# Patient Record
Sex: Female | Born: 1950 | Hispanic: Yes | Smoking: Former smoker
Health system: Southern US, Community
[De-identification: ages and names within clinical notes are randomized; demographics above are authoritative.]

## PROBLEM LIST (undated history)

## (undated) DIAGNOSIS — Z800 Family history of malignant neoplasm of digestive organs: Secondary | ICD-10-CM

## (undated) DIAGNOSIS — F32A Depression, unspecified: Secondary | ICD-10-CM

## (undated) DIAGNOSIS — B019 Varicella without complication: Secondary | ICD-10-CM

## (undated) DIAGNOSIS — J45909 Unspecified asthma, uncomplicated: Secondary | ICD-10-CM

## (undated) DIAGNOSIS — I10 Essential (primary) hypertension: Secondary | ICD-10-CM

## (undated) DIAGNOSIS — Z8041 Family history of malignant neoplasm of ovary: Secondary | ICD-10-CM

## (undated) DIAGNOSIS — R569 Unspecified convulsions: Secondary | ICD-10-CM

## (undated) DIAGNOSIS — F329 Major depressive disorder, single episode, unspecified: Secondary | ICD-10-CM

## (undated) DIAGNOSIS — D649 Anemia, unspecified: Secondary | ICD-10-CM

## (undated) DIAGNOSIS — R51 Headache: Secondary | ICD-10-CM

## (undated) DIAGNOSIS — Z8049 Family history of malignant neoplasm of other genital organs: Secondary | ICD-10-CM

## (undated) HISTORY — DX: Family history of malignant neoplasm of ovary: Z80.41

## (undated) HISTORY — DX: Major depressive disorder, single episode, unspecified: F32.9

## (undated) HISTORY — PX: CATARACT EXTRACTION: SUR2

## (undated) HISTORY — DX: Unspecified convulsions: R56.9

## (undated) HISTORY — DX: Depression, unspecified: F32.A

## (undated) HISTORY — DX: Anemia, unspecified: D64.9

## (undated) HISTORY — DX: Family history of malignant neoplasm of digestive organs: Z80.0

## (undated) HISTORY — DX: Unspecified asthma, uncomplicated: J45.909

## (undated) HISTORY — DX: Headache: R51

## (undated) HISTORY — DX: Family history of malignant neoplasm of other genital organs: Z80.49

## (undated) HISTORY — DX: Essential (primary) hypertension: I10

## (undated) HISTORY — DX: Varicella without complication: B01.9

## (undated) HISTORY — PX: COLONOSCOPY: SHX174

---

## 1987-11-29 HISTORY — PX: ABDOMINAL HYSTERECTOMY: SHX81

## 2007-05-01 ENCOUNTER — Emergency Department (HOSPITAL_COMMUNITY): Admission: EM | Admit: 2007-05-01 | Discharge: 2007-05-01 | Payer: Self-pay | Admitting: Emergency Medicine

## 2009-08-18 ENCOUNTER — Encounter: Admission: RE | Admit: 2009-08-18 | Discharge: 2009-08-18 | Payer: Self-pay | Admitting: Family Medicine

## 2010-07-16 ENCOUNTER — Emergency Department (HOSPITAL_BASED_OUTPATIENT_CLINIC_OR_DEPARTMENT_OTHER)
Admission: EM | Admit: 2010-07-16 | Discharge: 2010-07-16 | Payer: Self-pay | Source: Home / Self Care | Admitting: Emergency Medicine

## 2010-07-16 ENCOUNTER — Observation Stay (HOSPITAL_COMMUNITY): Admission: AD | Admit: 2010-07-16 | Discharge: 2010-07-17 | Payer: Self-pay | Admitting: Internal Medicine

## 2010-07-16 ENCOUNTER — Ambulatory Visit: Payer: Self-pay | Admitting: Diagnostic Radiology

## 2011-02-11 LAB — BASIC METABOLIC PANEL
BUN: 5 mg/dL — ABNORMAL LOW (ref 6–23)
CO2: 30 mEq/L (ref 19–32)
Calcium: 8.5 mg/dL (ref 8.4–10.5)
Chloride: 103 mEq/L (ref 96–112)
Creatinine, Ser: 0.61 mg/dL (ref 0.4–1.2)
GFR calc Af Amer: >60 mL/min (ref >60)
GFR calc non Af Amer: >60 mL/min (ref >60)
Glucose, Bld: 92 mg/dL (ref 70–99)
Potassium: 4.2 mEq/L (ref 3.5–5.1)
Sodium: 138 mEq/L (ref 135–145)

## 2011-02-11 LAB — COMPREHENSIVE METABOLIC PANEL
ALT: 33 U/L (ref 0–35)
AST: 23 U/L (ref 0–37)
Albumin: 3.6 g/dL (ref 3.5–5.2)
Alkaline Phosphatase: 79 U/L (ref 39–117)
BUN: 9 mg/dL (ref 6–23)
CO2: 28 mEq/L (ref 19–32)
Calcium: 8.3 mg/dL — ABNORMAL LOW (ref 8.4–10.5)
Chloride: 102 mEq/L (ref 96–112)
Creatinine, Ser: 0.6 mg/dL (ref 0.4–1.2)
GFR calc Af Amer: >60 mL/min (ref >60)
GFR calc non Af Amer: >60 mL/min (ref >60)
Glucose, Bld: 180 mg/dL — ABNORMAL HIGH (ref 70–99)
Potassium: 4.3 mEq/L (ref 3.5–5.1)
Sodium: 137 mEq/L (ref 135–145)
Total Bilirubin: 0.6 mg/dL (ref 0.3–1.2)
Total Protein: 6.7 g/dL (ref 6.0–8.3)

## 2011-02-11 LAB — CSF CELL COUNT WITH DIFFERENTIAL
RBC Count, CSF: 0 /mm3
Tube #: 4
WBC, CSF: 0 /mm3 (ref 0–5)

## 2011-02-11 LAB — POCT TOXICOLOGY PANEL

## 2011-02-11 LAB — CSF CULTURE W GRAM STAIN: Culture: NO GROWTH

## 2011-02-11 LAB — GLUCOSE, CAPILLARY: Glucose-Capillary: 174 mg/dL — ABNORMAL HIGH (ref 70–99)

## 2011-02-11 LAB — GLUCOSE, CSF: Glucose, CSF: 89 mg/dL — ABNORMAL HIGH (ref 43–76)

## 2011-02-11 LAB — URINALYSIS, ROUTINE W REFLEX MICROSCOPIC
Bilirubin Urine: NEGATIVE
Hgb urine dipstick: NEGATIVE
Protein, ur: NEGATIVE mg/dL
pH: 6.5 (ref 5.0–8.0)

## 2011-02-11 LAB — CBC
HCT: 37.1 % (ref 36.0–46.0)
HCT: 41.5 % (ref 36.0–46.0)
Hemoglobin: 12.8 g/dL (ref 12.0–15.0)
Hemoglobin: 14.3 g/dL (ref 12.0–15.0)
MCH: 31.6 pg (ref 26.0–34.0)
MCH: 31.6 pg (ref 26.0–34.0)
MCHC: 34.4 g/dL (ref 30.0–36.0)
MCHC: 34.4 g/dL (ref 30.0–36.0)
MCV: 91.8 fL (ref 78.0–100.0)
MCV: 92 fL (ref 78.0–100.0)
Platelets: 202 10*3/uL (ref 150–400)
Platelets: 224 10*3/uL (ref 150–400)
RBC: 4.03 MIL/uL (ref 3.87–5.11)
RBC: 4.51 MIL/uL (ref 3.87–5.11)
RDW: 11.8 % (ref 11.5–15.5)
RDW: 12.4 % (ref 11.5–15.5)
WBC: 6.8 10*3/uL (ref 4.0–10.5)
WBC: 8.5 10*3/uL (ref 4.0–10.5)

## 2011-02-11 LAB — DIFFERENTIAL
Monocytes Relative: 4 % (ref 3–12)
Neutrophils Relative %: 85 % — ABNORMAL HIGH (ref 43–77)

## 2011-02-11 LAB — GRAM STAIN

## 2011-02-11 LAB — TSH: TSH: 0.329 u[IU]/mL — ABNORMAL LOW (ref 0.350–4.500)

## 2011-02-11 LAB — HEMOGLOBIN A1C
Hgb A1c MFr Bld: 6.3 % — ABNORMAL HIGH (ref <5.7)
Mean Plasma Glucose: 134 mg/dL — ABNORMAL HIGH (ref <117)

## 2011-02-11 LAB — ETHANOL: Alcohol, Ethyl (B): <10 mg/dL (ref 0–10)

## 2011-02-11 LAB — PROTEIN, CSF: Total  Protein, CSF: 20 mg/dL (ref 15–45)

## 2011-09-01 ENCOUNTER — Other Ambulatory Visit: Payer: Self-pay | Admitting: Obstetrics

## 2012-07-17 ENCOUNTER — Ambulatory Visit (INDEPENDENT_AMBULATORY_CARE_PROVIDER_SITE_OTHER): Payer: PRIVATE HEALTH INSURANCE | Admitting: Family Medicine

## 2012-07-17 ENCOUNTER — Encounter: Payer: Self-pay | Admitting: Family Medicine

## 2012-07-17 VITALS — BP 120/78 | HR 64 | Temp 98.4°F | Ht 58.5 in | Wt 134.0 lb

## 2012-07-17 DIAGNOSIS — E871 Hypo-osmolality and hyponatremia: Secondary | ICD-10-CM

## 2012-07-17 DIAGNOSIS — I10 Essential (primary) hypertension: Secondary | ICD-10-CM | POA: Insufficient documentation

## 2012-07-17 DIAGNOSIS — G40309 Generalized idiopathic epilepsy and epileptic syndromes, not intractable, without status epilepticus: Secondary | ICD-10-CM

## 2012-07-17 DIAGNOSIS — Z800 Family history of malignant neoplasm of digestive organs: Secondary | ICD-10-CM | POA: Insufficient documentation

## 2012-07-17 DIAGNOSIS — G40409 Other generalized epilepsy and epileptic syndromes, not intractable, without status epilepticus: Secondary | ICD-10-CM | POA: Insufficient documentation

## 2012-07-17 DIAGNOSIS — Z1211 Encounter for screening for malignant neoplasm of colon: Secondary | ICD-10-CM

## 2012-07-17 DIAGNOSIS — Z8709 Personal history of other diseases of the respiratory system: Secondary | ICD-10-CM | POA: Insufficient documentation

## 2012-07-17 LAB — BASIC METABOLIC PANEL
CO2: 32 mEq/L (ref 19–32)
Sodium: 132 mEq/L — ABNORMAL LOW (ref 135–145)

## 2012-07-17 NOTE — Patient Instructions (Addendum)
Schedule complete physical Set up screening mammogram We will call you regarding colonoscopy.

## 2012-07-17 NOTE — Progress Notes (Signed)
  Subjective:    Patient ID: Glenda Francis, female    DOB: [birth year], 61 y.o.   MRN: [id number]  HPI  Here to establish care. Patient has history of reported asthma in childhood. Past history of depression not currently treated. Hypertension treated with Diovan. History of seizure disorder. Diagnosed over 2 years ago. Grand mal seizures. Patient does not drive. No seizure almost one year. Follow closely by neurology. Reported hyponatremia by recent labs. Requesting repeat today. She's not sure of exact lab value. Does not take any diuretics. Hypertension treated with Diovan 160 mg daily.  Patient is nonsmoker. Previous hysterectomy which is partial hysterectomy secondary to menorrhagia. Divorced. 3 children. 2 grandchildren. Drinks 1-2 glasses of red wine per day.  Family history significant for mother ovarian cancer. She had 2 brothers with colon cancer one deceased. She's never had screening colonoscopy. Mother had hyperlipidemia and hypertension.  Patient recently started exercising regularly and feels well overall. No appetite or weight changes.   Review of Systems  Constitutional: Negative for appetite change, fatigue and unexpected weight change.  Eyes: Negative for visual disturbance.  Respiratory: Negative for cough, chest tightness, shortness of breath and wheezing.   Cardiovascular: Negative for chest pain, palpitations and leg swelling.  Gastrointestinal: Negative for abdominal pain.  Genitourinary: Negative for dysuria.  Neurological: Negative for dizziness, seizures, syncope, weakness, light-headedness and headaches.       Objective:   Physical Exam  Constitutional: She is oriented to person, place, and time. She appears well-developed and well-nourished.  HENT:  Right Ear: External ear normal.  Left Ear: External ear normal.  Mouth/Throat: Oropharynx is clear and moist.  Neck: Neck supple. No thyromegaly present.  Cardiovascular: Normal rate and regular rhythm.     Pulmonary/Chest: Effort normal and breath sounds normal. No respiratory distress. She has no wheezes. She has no rales.  Musculoskeletal: She exhibits no edema.  Lymphadenopathy:    She has no cervical adenopathy.  Neurological: She is alert and oriented to person, place, and time.          Assessment & Plan:  #1 seizure disorder. Followed by neurology. #2 history of hyponatremia by history. Recheck basic metabolic panel #3 reported history of mild intermittent asthma. No current issues. No wheezing in several years  #4 history of depression #5 strong family history of colon cancer 2 brothers. Patient never screened. Set up screening colonoscopy.  #6 other health maintenance.  No recent mammogram. She'll schedule this. Schedule complete physical.

## 2012-07-18 NOTE — Progress Notes (Signed)
Quick Note:  Left a message on pt's voicemail to return call. ______

## 2012-07-19 ENCOUNTER — Other Ambulatory Visit: Payer: Self-pay | Admitting: *Deleted

## 2012-07-19 MED ORDER — VALSARTAN-HYDROCHLOROTHIAZIDE 160-12.5 MG PO TABS: 1.0000 | ORAL_TABLET | Freq: Every day | ORAL | Status: DC

## 2012-07-23 ENCOUNTER — Other Ambulatory Visit: Payer: Self-pay | Admitting: Family Medicine

## 2012-07-23 MED ORDER — VALSARTAN 160 MG PO TABS: 160.0000 mg | ORAL_TABLET | Freq: Every day | ORAL | Status: DC

## 2012-07-24 ENCOUNTER — Other Ambulatory Visit: Payer: Self-pay | Admitting: Family Medicine

## 2012-07-24 DIAGNOSIS — Z1231 Encounter for screening mammogram for malignant neoplasm of breast: Secondary | ICD-10-CM

## 2012-08-07 ENCOUNTER — Ambulatory Visit
Admission: RE | Admit: 2012-08-07 | Discharge: 2012-08-07 | Disposition: A | Discharge status: Home / Self Care | Payer: PRIVATE HEALTH INSURANCE | Source: Ambulatory Visit | Attending: Family Medicine | Admitting: Family Medicine

## 2012-08-07 DIAGNOSIS — Z1231 Encounter for screening mammogram for malignant neoplasm of breast: Secondary | ICD-10-CM

## 2012-08-21 ENCOUNTER — Encounter: Payer: Self-pay | Admitting: Family Medicine

## 2012-08-21 ENCOUNTER — Ambulatory Visit (INDEPENDENT_AMBULATORY_CARE_PROVIDER_SITE_OTHER): Payer: PRIVATE HEALTH INSURANCE | Admitting: Family Medicine

## 2012-08-21 VITALS — BP 122/70 | Temp 99.0°F | Wt 130.0 lb

## 2012-08-21 DIAGNOSIS — E871 Hypo-osmolality and hyponatremia: Secondary | ICD-10-CM

## 2012-08-21 DIAGNOSIS — G4700 Insomnia, unspecified: Secondary | ICD-10-CM

## 2012-08-21 DIAGNOSIS — I10 Essential (primary) hypertension: Secondary | ICD-10-CM

## 2012-08-21 DIAGNOSIS — Z79899 Other long term (current) drug therapy: Secondary | ICD-10-CM

## 2012-08-21 LAB — CBC WITH DIFFERENTIAL/PLATELET
Basophils Relative: 0.9 % (ref 0.0–3.0)
Eosinophils Absolute: 0.2 10*3/uL (ref 0.0–0.7)
Eosinophils Relative: 3.4 % (ref 0.0–5.0)
Hemoglobin: 14.1 g/dL (ref 12.0–15.0)
Lymphocytes Relative: 43.6 % (ref 12.0–46.0)
MCHC: 33.2 g/dL (ref 30.0–36.0)
MCV: 93.3 fl (ref 78.0–100.0)
Neutro Abs: 1.7 10*3/uL (ref 1.4–7.7)
Neutrophils Relative %: 38.8 % — ABNORMAL LOW (ref 43.0–77.0)
RBC: 4.55 Mil/uL (ref 3.87–5.11)
WBC: 4.4 10*3/uL — ABNORMAL LOW (ref 4.5–10.5)

## 2012-08-21 LAB — BASIC METABOLIC PANEL
BUN: 16 mg/dL (ref 6–23)
Calcium: 9.3 mg/dL (ref 8.4–10.5)
Creatinine, Ser: 0.7 mg/dL (ref 0.4–1.2)

## 2012-08-21 LAB — HEPATIC FUNCTION PANEL
ALT: 23 U/L (ref 0–35)
Bilirubin, Direct: 0.1 mg/dL (ref 0.0–0.3)
Total Bilirubin: 0.6 mg/dL (ref 0.3–1.2)

## 2012-08-21 NOTE — Progress Notes (Signed)
  Subjective:    Patient ID: Glenda Francis, female    DOB: 1951/05/11, 61 y.o.   MRN: [id number]  HPI  Followup hypertension and hyponatremia. Recent sodium 132. We discontinued Diovan HCTZ and switched to plain Diovan. Her blood pressures been stable. No headaches. No dizziness. Plan is to repeat basic metabolic panel today.  Recent problem with insomnia. No difficulty falling asleep but has early morning awakening. No depression issues. Drinks about 2 glasses of wine per night. Does not take any sedative hypnotics. No major stress issues. Frequently stays awake for several hours before falling back to sleep.  History of grand mal seizures. No seizures almost one year. Hopes to be released to driving within the next month.  Past Medical History  Diagnosis Date  . Asthma     childhood  . Chicken pox   . Depression   . Hypertension   . Seizures    Past Surgical History  Procedure Date  . Abdominal hysterectomy 1989    partial, menorrhagia    reports that she has never smoked. She does not have any smokeless tobacco history on file. Her alcohol and drug histories not on file. family history includes Asthma in her other; Cancer in her brothers and other; and Hyperlipidemia in her other. No Known Allergies    Review of Systems  Constitutional: Negative for fatigue and unexpected weight change.  Eyes: Negative for visual disturbance.  Respiratory: Negative for cough, chest tightness, shortness of breath and wheezing.   Cardiovascular: Negative for chest pain, palpitations and leg swelling.  Neurological: Negative for dizziness, seizures, syncope, weakness, light-headedness and headaches.       Objective:   Physical Exam  Constitutional: She is oriented to person, place, and time. She appears well-developed and well-nourished.  Neck: Neck supple. No thyromegaly present.  Cardiovascular: Normal rate and regular rhythm.   Pulmonary/Chest: Effort normal and breath sounds normal. No  respiratory distress. She has no wheezes. She has no rales.  Musculoskeletal: She exhibits no edema.  Lymphadenopathy:    She has no cervical adenopathy.  Neurological: She is alert and oriented to person, place, and time. No cranial nerve deficit.          Assessment & Plan:  #1 hyponatremia. Hopefully improved off of HCTZ. Recheck basic metabolic panel #2 hypertension well controlled with recent change from Diovan HCTZ to regular Diovan.  #3 insomnia. Handout on sleep hygiene given. Reduce alcohol consumption at night. We have recommended against sedative hypnotics at this time. #4 flu vaccine offered and patient declines. She plans to set up colonoscopy

## 2012-08-21 NOTE — Patient Instructions (Signed)

## 2012-08-22 NOTE — Progress Notes (Signed)
Quick Note:  Pt informed ______ 

## 2012-08-22 NOTE — Progress Notes (Signed)
Quick Note:  Will fax report to Darrol Angel, MD at [phone] ______

## 2012-12-25 ENCOUNTER — Telehealth: Payer: Self-pay | Admitting: Family Medicine

## 2012-12-25 MED ORDER — VALSARTAN 160 MG PO TABS: 160.0000 mg | ORAL_TABLET | Freq: Every day | ORAL | Status: DC

## 2012-12-25 NOTE — Telephone Encounter (Signed)
Patient called stating that she need a refill of her diovan 160mg  1poqd sent to [facility] [address]. Please assist.

## 2012-12-31 ENCOUNTER — Telehealth: Payer: Self-pay | Admitting: Family Medicine

## 2012-12-31 NOTE — Telephone Encounter (Signed)
Per pt's new 2014 plan for [employer] [place], prior auth for Diovan is denied. Per insurance, patient must try one of the following: candasartan/candasartan HCTZ, irbesartan/irbesartan HCTZ, losartan/losartan HCTZ, valsartan/valsartan HCTZ.   Pharmacy: [facility] [address]

## 2013-01-01 ENCOUNTER — Telehealth: Payer: Self-pay | Admitting: Family Medicine

## 2013-01-01 MED ORDER — LOSARTAN POTASSIUM 100 MG PO TABS: 100.0000 mg | ORAL_TABLET | Freq: Every day | ORAL | Status: DC

## 2013-01-01 NOTE — Telephone Encounter (Signed)
Patient's son called. He was upset about her Diovan not being approved by [employer]. I checked for samples, we do not have any. I consulted w/ Dr. Tawanna Cooler, as she will be out of her Diovan after today. He rx'd Cozaar 100mg  and said for pt f/u w/ PCP. I asked to speak w/ patietn as there is no DPR. I explained what Dr. Tawanna Cooler said, including having her check her BP daily. If anything is out of the norm, she will call our office. Otherwise, she has appt w/Dr. Caryl Never on 2/12. The son was not agreeable with this plan. I reiterated to the son that she cannot be without medication, but that also, [employer] was not going to pay for Diovan, so there was no choice but to switch to another preferred med in that class. He did not seem to understand, kept asking why she could not be on valsartan. I tried to explain that that was Diovan, which they would not pay for. Glenda Francis was very happy with the plan, understood what to do, and said she would call if needed. FYI.

## 2013-01-01 NOTE — Telephone Encounter (Signed)
Cozaar per Dr Tawanna Cooler, Rx sent to pharmacy.

## 2013-01-09 ENCOUNTER — Ambulatory Visit (INDEPENDENT_AMBULATORY_CARE_PROVIDER_SITE_OTHER): Payer: BC Managed Care – PPO | Admitting: Family Medicine

## 2013-01-09 ENCOUNTER — Encounter: Payer: Self-pay | Admitting: Family Medicine

## 2013-01-09 VITALS — BP 122/70 | Temp 97.7°F | Wt 131.0 lb

## 2013-01-09 DIAGNOSIS — I10 Essential (primary) hypertension: Secondary | ICD-10-CM

## 2013-01-09 NOTE — Patient Instructions (Addendum)

## 2013-01-09 NOTE — Progress Notes (Signed)
  Subjective:    Patient ID: Glenda Francis, female    DOB: [birth year], 62 y.o.   MRN: [id number]  HPI  Followup hypertension. Patient recently switched to losartan because of insurance issues. She's tolerating with no side effects. Blood pressures generally well-controlled at home in normal range. No headaches. No dizziness. Generally feels well. She usually walks about 1 mile per day for exercise.  Remains on Keppra and Depakote per neurology and no seizures in over one year.  Past Medical History  Diagnosis Date  . Asthma     childhood  . Chicken pox   . Depression   . Hypertension   . Seizures    Past Surgical History  Procedure Laterality Date  . Abdominal hysterectomy  1989    partial, menorrhagia    reports that she has never smoked. She does not have any smokeless tobacco history on file. Her alcohol and drug histories are not on file. family history includes Asthma in her other; Cancer in her brothers and other; and Hyperlipidemia in her other. No Known Allergies    Review of Systems  Constitutional: Negative for fatigue.  Eyes: Negative for visual disturbance.  Respiratory: Negative for cough, chest tightness, shortness of breath and wheezing.   Cardiovascular: Negative for chest pain, palpitations and leg swelling.  Neurological: Negative for dizziness, seizures, syncope, weakness, light-headedness and headaches.       Objective:   Physical Exam  Constitutional: She appears well-developed and well-nourished.  Neck: Neck supple. No thyromegaly present.  Cardiovascular: Normal rate and regular rhythm.   Pulmonary/Chest: Effort normal and breath sounds normal. No respiratory distress. She has no wheezes. She has no rales.  Musculoskeletal: She exhibits no edema.          Assessment & Plan:  Hypertension. Stable by home readings and here today after change of medication to losartan continue close monitoring. Reassess 6 months

## 2013-04-09 ENCOUNTER — Other Ambulatory Visit: Payer: Self-pay | Admitting: Neurology

## 2013-04-12 ENCOUNTER — Encounter: Payer: Self-pay | Admitting: Internal Medicine

## 2013-04-26 ENCOUNTER — Other Ambulatory Visit: Payer: Self-pay | Admitting: Family Medicine

## 2013-04-26 ENCOUNTER — Other Ambulatory Visit: Payer: Self-pay | Admitting: Neurology

## 2013-05-02 ENCOUNTER — Encounter: Payer: Self-pay | Admitting: Internal Medicine

## 2013-05-02 ENCOUNTER — Ambulatory Visit (AMBULATORY_SURGERY_CENTER): Payer: BC Managed Care – PPO | Admitting: *Deleted

## 2013-05-02 VITALS — Ht 60.0 in | Wt 132.8 lb

## 2013-05-02 DIAGNOSIS — Z800 Family history of malignant neoplasm of digestive organs: Secondary | ICD-10-CM

## 2013-05-02 DIAGNOSIS — Z1211 Encounter for screening for malignant neoplasm of colon: Secondary | ICD-10-CM

## 2013-05-02 MED ORDER — MOVIPREP 100 G PO SOLR: 1.0000 | Freq: Once | ORAL | Status: DC

## 2013-05-02 NOTE — Progress Notes (Signed)
No egg or soy allergy. ewm Pt does have seizure disorder. On keppra and depakote. And controlled. ewm No home oxygen use. ewm No prior colonoscopy per pt. ewm No problems with past sedation. ewm

## 2013-05-16 ENCOUNTER — Encounter: Payer: Self-pay | Admitting: Internal Medicine

## 2013-05-16 ENCOUNTER — Ambulatory Visit (AMBULATORY_SURGERY_CENTER): Payer: BC Managed Care – PPO | Admitting: Internal Medicine

## 2013-05-16 VITALS — BP 130/59 | HR 57 | Temp 97.2°F | Resp 18 | Ht 60.0 in | Wt 130.0 lb

## 2013-05-16 DIAGNOSIS — Z800 Family history of malignant neoplasm of digestive organs: Secondary | ICD-10-CM

## 2013-05-16 DIAGNOSIS — Z1211 Encounter for screening for malignant neoplasm of colon: Secondary | ICD-10-CM

## 2013-05-16 MED ORDER — SODIUM CHLORIDE 0.9 % IV SOLN: 500.0000 mL | INTRAVENOUS | Status: DC

## 2013-05-16 NOTE — Progress Notes (Signed)
Patient did not experience any of the following events: a burn prior to discharge; a fall within the facility; wrong site/side/patient/procedure/implant event; or a hospital transfer or hospital admission upon discharge from the facility. (G8907) Patient did not have preoperative order for IV antibiotic SSI prophylaxis. (G8918)  

## 2013-05-16 NOTE — Op Note (Signed)
[facility] [address] [place], [address]   COLONOSCOPY PROCEDURE REPORT  PATIENT: Glenda, Francis  MR#: [id number] BIRTHDATE: Dec 31, 1950 , 61  yrs. old GENDER: Female ENDOSCOPIST: Beverley Fiedler, MD REFERRED AO:ZHYQM Caryl Never, M.D. PROCEDURE DATE:  05/16/2013 PROCEDURE:   Colonoscopy, screening ASA CLASS:   Class II INDICATIONS:elevated risk screening and Patient's immediate family history of colon cancer (2 brothers). MEDICATIONS: MAC sedation, administered by CRNA and propofol (Diprivan) 150mg  IV  DESCRIPTION OF PROCEDURE:   After the risks benefits and alternatives of the procedure were thoroughly explained, informed consent was obtained.  A digital rectal exam revealed no rectal mass.   The LB [id number]  endoscope was introduced through the anus and advanced to the cecum, which was identified by both the appendix and ileocecal valve. No adverse events experienced. The quality of the prep was good, using MoviPrep  The instrument was then slowly withdrawn as the colon was fully examined.      COLON FINDINGS: Moderate diverticulosis was noted in the ascending colon, transverse colon, descending colon, and sigmoid colon.   The colon mucosa was otherwise normal.  Retroflexed views revealed no abnormalities. The time to cecum=4 minutes 31 seconds.  Withdrawal time=8 minutes 42 seconds.  The scope was withdrawn and the procedure completed.  COMPLICATIONS: There were no complications.  ENDOSCOPIC IMPRESSION: 1.   Moderate diverticulosis was noted in the ascending colon, transverse colon, descending colon, and sigmoid colon 2.   The colon mucosa was otherwise normal  RECOMMENDATIONS: 1.  High fiber diet 2.  Given your significant family history of colon cancer, you should have a repeat colonoscopy in 5 years   eSigned:  Beverley Fiedler, MD 05/16/2013 3:05 PM   cc: The Patient and Evelena Peat, MD   PATIENT NAME:  Glenda, Francis MR#:  [id number]

## 2013-05-16 NOTE — Progress Notes (Signed)
Lidocaine-40mg IV prior to Propofol InductionPropofol given over incremental dosages 

## 2013-05-16 NOTE — Patient Instructions (Addendum)
Discharge instructions given with verbal understanding. Handouts on diverticulosis and a high fiber diet given. Resume previous medications.YOU HAD AN ENDOSCOPIC PROCEDURE TODAY AT THE [facility]: Refer to the procedure report that was given to you for any specific questions about what was found during the examination.  If the procedure report does not answer your questions, please call your gastroenterologist to clarify.  If you requested that your care partner not be given the details of your procedure findings, then the procedure report has been included in a sealed envelope for you to review at your convenience later.  YOU SHOULD EXPECT: Some feelings of bloating in the abdomen. Passage of more gas than usual.  Walking can help get rid of the air that was put into your GI tract during the procedure and reduce the bloating. If you had a lower endoscopy (such as a colonoscopy or flexible sigmoidoscopy) you may notice spotting of blood in your stool or on the toilet paper. If you underwent a bowel prep for your procedure, then you may not have a normal bowel movement for a few days.  DIET: Your first meal following the procedure should be a light meal and then it is ok to progress to your normal diet.  A half-sandwich or bowl of soup is an example of a good first meal.  Heavy or fried foods are harder to digest and may make you feel nauseous or bloated.  Likewise meals heavy in dairy and vegetables can cause extra gas to form and this can also increase the bloating.  Drink plenty of fluids but you should avoid alcoholic beverages for 24 hours.  ACTIVITY: Your care partner should take you home directly after the procedure.  You should plan to take it easy, moving slowly for the rest of the day.  You can resume normal activity the day after the procedure however you should NOT DRIVE or use heavy machinery for 24 hours (because of the sedation medicines used during the test).    SYMPTOMS TO  REPORT IMMEDIATELY: A gastroenterologist can be reached at any hour.  During normal business hours, 8:30 AM to 5:00 PM Monday through Friday, call [phone].  After hours and on weekends, please call the GI answering service at [phone] who will take a message and have the physician on call contact you.   Following lower endoscopy (colonoscopy or flexible sigmoidoscopy):  Excessive amounts of blood in the stool  Significant tenderness or worsening of abdominal pains  Swelling of the abdomen that is new, acute  Fever of 100F or higher  FOLLOW UP: If any biopsies were taken you will be contacted by phone or by letter within the next 1-3 weeks.  Call your gastroenterologist if you have not heard about the biopsies in 3 weeks.  Our staff will call the home number listed on your records the next business day following your procedure to check on you and address any questions or concerns that you may have at that time regarding the information given to you following your procedure. This is a courtesy call and so if there is no answer at the home number and we have not heard from you through the emergency physician on call, we will assume that you have returned to your regular daily activities without incident.  SIGNATURES/CONFIDENTIALITY: You and/or your care partner have signed paperwork which will be entered into your electronic medical record.  These signatures attest to the fact that that the information above on your   After Visit Summary has been reviewed and is understood.  Full responsibility of the confidentiality of this discharge information lies with you and/or your care-partner.   

## 2013-05-17 ENCOUNTER — Telehealth: Payer: Self-pay | Admitting: *Deleted

## 2013-05-17 NOTE — Telephone Encounter (Signed)
No answer, left message to call if questions or concerns. 

## 2013-07-29 ENCOUNTER — Other Ambulatory Visit: Payer: Self-pay | Admitting: Neurology

## 2013-07-30 ENCOUNTER — Other Ambulatory Visit: Payer: Self-pay | Admitting: Neurology

## 2013-08-27 ENCOUNTER — Other Ambulatory Visit: Payer: Self-pay | Admitting: Neurology

## 2013-08-28 ENCOUNTER — Other Ambulatory Visit: Payer: Self-pay

## 2013-08-28 MED ORDER — LOSARTAN POTASSIUM 100 MG PO TABS: ORAL_TABLET | ORAL | Status: DC

## 2013-08-30 ENCOUNTER — Encounter: Payer: Self-pay | Admitting: Family Medicine

## 2013-08-30 ENCOUNTER — Ambulatory Visit (INDEPENDENT_AMBULATORY_CARE_PROVIDER_SITE_OTHER): Payer: BC Managed Care – PPO | Admitting: Family Medicine

## 2013-08-30 VITALS — BP 118/80 | HR 64 | Temp 97.8°F | Wt 134.0 lb

## 2013-08-30 DIAGNOSIS — Z23 Encounter for immunization: Secondary | ICD-10-CM

## 2013-08-30 DIAGNOSIS — M25561 Pain in right knee: Secondary | ICD-10-CM

## 2013-08-30 DIAGNOSIS — M25569 Pain in unspecified knee: Secondary | ICD-10-CM

## 2013-08-30 MED ORDER — MELOXICAM 15 MG PO TABS: 15.0000 mg | ORAL_TABLET | Freq: Every day | ORAL | Status: DC

## 2013-08-30 NOTE — Progress Notes (Signed)
  Subjective:    Patient ID: Glenda Francis, female    DOB: [birth year], 62 y.o.   MRN: [id number]  HPI Patient seen with right knee pain. No specific injury. Onset about one week ago. She has pain mostly medial aspect of knee. No locking or giving way. No redness. No warmth. No ecchymosis. No prior history of knee difficulties. She is taking occasional ibuprofen with some relief.  Patient requesting flu vaccine. Also we cannot confirm last tetanus  Past Medical History  Diagnosis Date  . Asthma     childhood  . Chicken pox   . Depression   . Hypertension   . Seizures     STARTED 21/2 YEARS AGO   Past Surgical History  Procedure Laterality Date  . Abdominal hysterectomy  1989    partial, menorrhagia    reports that she has never smoked. She has never used smokeless tobacco. She reports that she drinks about 4.2 ounces of alcohol per week. She reports that she does not use illicit drugs. family history includes Asthma in her other; Cancer in her brother, brother, and mother; Colon cancer in her brother and brother; Hyperlipidemia in her other. No Known Allergies    Review of Systems  Constitutional: Negative for fever and chills.       Objective:   Physical Exam  Constitutional: She appears well-developed and well-nourished.  Cardiovascular: Normal rate and regular rhythm.   Pulmonary/Chest: Effort normal and breath sounds normal. No respiratory distress. She has no wheezes. She has no rales.  Musculoskeletal:  Right knee reveals no warmth. No erythema. No ecchymosis. Possibly trace effusion. She has mild to moderate medial joint line tenderness. No other tenderness noted. Full range of motion. Ligament testing is normal          Assessment & Plan:  Right knee pain. Suspect medial meniscal injury. Meloxicam 15 mg once daily. Avoid excessive squatting or bending. Followup in 3-4 weeks if not resolving.

## 2013-08-30 NOTE — Patient Instructions (Addendum)
Knee, Cartilage (Meniscus) Injury It is suspected that you have a torn cartilage (meniscus) in your knee. The menisci are made of tough cartilage, and fit between the surfaces of the thigh and leg bones. The menisci are "C"shaped and have a wedged profile. The wedged profile helps the stability of the joint by keeping the rounded femur surface from sliding off the flat tibial surface. The menisci are fed (nourished) by small blood vessels; but there is also a large area at the inner edge of the meniscus that does not have a good blood supply (avascular). This presents a problem when there is an injury to the meniscus, because areas without good blood supply heal poorly. As a result when there is a torn cartilage in the knee, surgery is often required to fix it. This is usually done with a surgical procedure less invasive than open surgery (arthroscopy). Some times open surgery of the knee is required if there is other damage. PURPOSE OF THE MENISCUS The medial meniscus rests on the medial tibial plateau. The tibia is the large bone in your lower leg (the shin bone). The medial tibial plateau is the upper end of the bone making up the inner part of your knee. The lateral meniscus serves the same purpose and is located on the outside of the knee. The menisci help to distribute your body weight across the knee joint; they act as shock absorbers. Without the meniscus present, the weight of your body would be unevenly applied to the bones in your legs (the femur and tibia). The femur is the large bone in your thigh. This uneven weight distribution would cause increased wear and tear on the cartilage lining the joint surfaces, leading to early damage (arthritis) of these areas. The presence of the menisci cartilage is necessary for a healthy knee. PURPOSE OF THE KNEE CARTILAGE The knee joint is made up of three bones: the thigh bone (femur), the shin bone (tibia), and the knee cap (patella). The surfaces of these  bones at the knee joint are covered with cartilage called articular cartilage. This smooth slippery surface allows the bones to slide against each other without causing bone damage. The meniscus sits between these cartilaginous surfaces of the bones. It distributes the weight evenly in the joints and helps with the stability of the joint (keeps the joint steady). HOME CARE INSTRUCTIONS  Use crutches and external braces as instructed.  Once home an ice pack applied to your injured knee may help with discomfort and keep the swelling down. An ice pack can be used for the first couple of days or as instructed.  Only take over-the-counter or prescription medicines for pain, discomfort, or fever as directed by your caregiver.  Call if you do not have relief of pain with medications or if there is increasing in pain.  Call if your foot becomes cold or blue.  You may resume normal diet and activities as directed.  Make sure to keep your appointment with your follow-up caregiver. This injury may require further evaluation and treatment beyond the temporary treatment given today. Document Released: 02/04/2003 Document Revised: 02/06/2012 Document Reviewed: 05/29/2009 [facility] Patient Information 2014 [place], [place].  Follow up in 3-4 weeks if no better.

## 2013-09-17 ENCOUNTER — Ambulatory Visit (INDEPENDENT_AMBULATORY_CARE_PROVIDER_SITE_OTHER): Payer: BC Managed Care – PPO | Admitting: Nurse Practitioner

## 2013-09-17 ENCOUNTER — Encounter: Payer: Self-pay | Admitting: Nurse Practitioner

## 2013-09-17 VITALS — BP 138/73 | HR 59 | Ht <= 58 in | Wt 132.0 lb

## 2013-09-17 DIAGNOSIS — Z79899 Other long term (current) drug therapy: Secondary | ICD-10-CM

## 2013-09-17 DIAGNOSIS — G40409 Other generalized epilepsy and epileptic syndromes, not intractable, without status epilepticus: Secondary | ICD-10-CM

## 2013-09-17 DIAGNOSIS — G40309 Generalized idiopathic epilepsy and epileptic syndromes, not intractable, without status epilepticus: Secondary | ICD-10-CM

## 2013-09-17 LAB — CBC WITH DIFFERENTIAL/PLATELET
Basophils Absolute: 0 10*3/uL (ref 0.0–0.2)
Basos: 1 %
Eosinophils Absolute: 0.2 10*3/uL (ref 0.0–0.4)
Hemoglobin: 13.3 g/dL (ref 11.1–15.9)
Lymphocytes Absolute: 2.5 10*3/uL (ref 0.7–3.1)
MCH: 31.2 pg (ref 26.6–33.0)
MCHC: 34.6 g/dL (ref 31.5–35.7)
MCV: 90 fL (ref 79–97)
Monocytes Absolute: 0.6 10*3/uL (ref 0.1–0.9)
Neutrophils Absolute: 1.5 10*3/uL (ref 1.4–7.0)
RBC: 4.26 x10E6/uL (ref 3.77–5.28)

## 2013-09-17 LAB — COMPREHENSIVE METABOLIC PANEL
ALT: 10 IU/L (ref 0–32)
AST: 17 IU/L (ref 0–40)
Alkaline Phosphatase: 56 IU/L (ref 39–117)
BUN/Creatinine Ratio: 21 (ref 11–26)
CO2: 32 mmol/L — ABNORMAL HIGH (ref 18–29)
Calcium: 9.5 mg/dL (ref 8.6–10.2)
Chloride: 94 mmol/L — ABNORMAL LOW (ref 96–108)
Globulin, Total: 2.5 g/dL (ref 1.5–4.5)
Potassium: 3.8 mmol/L (ref 3.5–5.2)
Sodium: 132 mmol/L — ABNORMAL LOW (ref 134–144)

## 2013-09-17 MED ORDER — DIVALPROEX SODIUM 500 MG PO DR TAB: 500.0000 mg | DELAYED_RELEASE_TABLET | Freq: Two times a day (BID) | ORAL | Status: DC

## 2013-09-17 MED ORDER — LEVETIRACETAM 500 MG PO TABS: 1000.0000 mg | ORAL_TABLET | Freq: Two times a day (BID) | ORAL | Status: DC

## 2013-09-17 NOTE — Patient Instructions (Signed)
Continue  Keppra and Depakote Will renew for 3 months with refills Check labs today F/U yearly

## 2013-09-17 NOTE — Progress Notes (Signed)
I have read the note, and I agree with the clinical assessment and plan.  Glenda Francis,Glenda Francis   

## 2013-09-17 NOTE — Progress Notes (Signed)
[facility]  PATIENT: Glenda Francis DOB: 01-01-1951   REASON FOR VISIT: Followup for seizure disorder   HISTORY OF PRESENT ILLNESS: Glenda Francis is a 62 year old right-handed Hispanic female with a history of seizures. This patient had been having what was felt to be panic attacks prior to her generalized seizures that occurred at night. The panic attacks were associated with amnesia and staring episodes at times. In retrospect, the daughter believes that the panic disorder was a manifestation of her seizures. EEG study done shows evidence of left brain sharp transients. MRI of the brain was unremarkable. Patient has been placed on Keppra, and is taking 500 mg tablets,total dose 2000mg .  Patient does have some drowsiness on the medication. Patient continued to have events and Depakote was added 07/2011.  No further seizure activity since depakote added. She is pleased with responce to meds.  She is exercising, she feels good, and she denies side effects to her meds. She was back to driving . No new neurologic complaints    REVIEW OF SYSTEMS: Full 14 system review of systems performed and notable only for:  Constitutional: N/A  Cardiovascular: N/A  Ear/Nose/Throat: N/A  Skin: N/A  Eyes: N/A  Respiratory: N/A  Gastroitestinal: N/A  Hematology/Lymphatic: N/A  Endocrine: N/A Musculoskeletal:N/A  Allergy/Immunology: Allergies Neurological: N/A Psychiatric: N/A   ALLERGIES: No Known Allergies  HOME MEDICATIONS: Outpatient Prescriptions Prior to Visit  Medication Sig Dispense Refill  . Calcium Carbonate-Vitamin D (CALCIUM + D PO) Take 1 capsule by mouth daily.      . Cyanocobalamin (VITAMIN B-12 CR PO) Take 1 capsule by mouth daily.      . divalproex (DEPAKOTE) 500 MG DR tablet Take 1 tablet (500 mg total) by mouth 2 (two) times daily.  60 tablet  0  . levETIRAcetam (KEPPRA) 500 MG tablet TAKE 2 TABLETS BY MOUTH TWICE A DAY  120 tablet  0  . losartan (COZAAR) 100 MG  tablet TAKE 1 TABLET (100 MG TOTAL) BY MOUTH DAILY.  30 tablet  3  . meloxicam (MOBIC) 15 MG tablet Take 1 tablet (15 mg total) by mouth daily.  30 tablet  1  . Multiple Vitamins-Minerals (MULTIVITAMIN PO) Take 1 capsule by mouth daily.      . Simethicone (GAS-X PO) Take 1 capsule by mouth as needed.       No facility-administered medications prior to visit.    PAST MEDICAL HISTORY: Past Medical History  Diagnosis Date  . Asthma     childhood  . Chicken pox   . Depression   . Hypertension   . Seizures     STARTED 21/2 YEARS AGO    PAST SURGICAL HISTORY: Past Surgical History  Procedure Laterality Date  . Abdominal hysterectomy  1989    partial, menorrhagia    FAMILY HISTORY: Family History  Problem Relation Age of Onset  . Cancer Mother     uterine,ovarian  . Asthma Other   . Hyperlipidemia Other   . Cancer Brother     colon-PASSEDA [address] 54  . Colon cancer Brother   . Cancer Brother     colon,stomach,brain  . Colon cancer Brother     SOCIAL HISTORY: History   Social History  . Marital Status: Divorced    Spouse Name: N/A    Number of Children: 3  . Years of Education: college   Occupational History  . retired    Social History Main Topics  . Smoking status: Former Games developer  . Smokeless tobacco:  Never Used  . Alcohol Use: 4.2 oz/week    7 Glasses of wine per week     Comment: 1 GLASS WINE WITH DINNER  . Drug Use: No  . Sexual Activity: Not on file   Other Topics Concern  . Not on file   Social History Narrative   Patient is divorced   Patient  has 3 children.   Patient is retired   Patient has some college.   Patient is currently doing yoga and water aerobics.     PHYSICAL EXAM  Filed Vitals:   09/17/13 1114  BP: 138/73  Pulse: 59  Height: 4\' 10"  (1.473 m)  Weight: 132 lb (59.875 kg)   Body mass index is 27.6 kg/(m^2).  Generalized: Well developed, in no acute distress   Neurological examination   Mentation: Alert oriented to time,  place, history taking. Follows all commands speech and language fluent  Cranial nerve II-XII: Pupils were equal round reactive to light extraocular movements were full, visual field were full on confrontational test. Facial sensation and strength were normal. hearing was intact to finger rubbing bilaterally. Uvula tongue midline. head turning and shoulder shrug and were normal and symmetric.Tongue protrusion into cheek strength was normal. Motor: normal bulk and tone, full strength in the BUE, BLE, fine finger movements normal, no pronator drift. No focal weakness Coordination: finger-nose-finger, heel-to-shin bilaterally, no dysmetria Reflexes: Brachioradialis 2/2, biceps 2/2, triceps 2/2, patellar 2/2, Achilles 2/2, plantar responses were flexor bilaterally. Gait and Station: Rising up from seated position without assistance, normal stance, without trunk ataxia, moderate stride, good arm swing, smooth turning, able to perform tiptoe, and heel walking without difficulty. Tandem stable, Romberg negative  DIAGNOSTIC DATA (LABS, IMAGING, TESTING) -None to review   ASSESSMENT AND PLAN  62 y.o. year old female  has a past medical history of Asthma;  Depression; Hypertension; and Seizures and panic attacks  here to followup. No seizures since 2012  Continue  Keppra and Depakote Will renew for 3 months with refills Check labs today, CBC and CMP F/U yearly Glenda Francis, [facility], [facility], APRN  [facility] [address], [address] [place], [place] [address] [phone]

## 2013-09-18 NOTE — Progress Notes (Signed)
Quick Note:  Spoke to patient and relayed stable labs, per [place]. ______

## 2013-10-25 ENCOUNTER — Other Ambulatory Visit: Payer: Self-pay

## 2013-10-25 DIAGNOSIS — Z1231 Encounter for screening mammogram for malignant neoplasm of breast: Secondary | ICD-10-CM

## 2013-11-29 ENCOUNTER — Ambulatory Visit: Payer: BC Managed Care – PPO

## 2013-12-10 ENCOUNTER — Ambulatory Visit
Admission: RE | Admit: 2013-12-10 | Discharge: 2013-12-10 | Disposition: A | Discharge status: Home / Self Care | Payer: BC Managed Care – PPO | Source: Ambulatory Visit

## 2013-12-10 DIAGNOSIS — Z1231 Encounter for screening mammogram for malignant neoplasm of breast: Secondary | ICD-10-CM

## 2013-12-25 ENCOUNTER — Other Ambulatory Visit: Payer: Self-pay | Admitting: Family Medicine

## 2014-01-30 ENCOUNTER — Telehealth: Payer: Self-pay | Admitting: Family Medicine

## 2014-01-30 MED ORDER — MELOXICAM 15 MG PO TABS: 15.0000 mg | ORAL_TABLET | Freq: Every day | ORAL | Status: DC

## 2014-01-30 NOTE — Telephone Encounter (Signed)
Pt needs refill on generic mobic call into [facility] [place]

## 2014-01-30 NOTE — Telephone Encounter (Signed)
RX sent to pharmacy  

## 2014-02-03 ENCOUNTER — Ambulatory Visit (INDEPENDENT_AMBULATORY_CARE_PROVIDER_SITE_OTHER): Payer: BC Managed Care – PPO | Admitting: Family Medicine

## 2014-02-03 ENCOUNTER — Encounter: Payer: Self-pay | Admitting: Family Medicine

## 2014-02-03 VITALS — BP 130/78 | HR 68 | Wt 135.0 lb

## 2014-02-03 DIAGNOSIS — H9319 Tinnitus, unspecified ear: Secondary | ICD-10-CM

## 2014-02-03 DIAGNOSIS — H9312 Tinnitus, left ear: Secondary | ICD-10-CM

## 2014-02-03 NOTE — Progress Notes (Signed)
   Subjective:    Patient ID: Glenda Francis, female    DOB: 1951-01-20, 63 y.o.   MRN: [id number]  HPI Patient complains of possibly several month history of ringing in the ears left ear greater than right. Possibly some associated hearing loss during this time. Again, she feels symptoms are worse left ear compared with right. She occasionally has a sensation of" roaring" type tinnitus involving the left year. Denies any headache. Occasional mild vertigo-but no consistent triggering factors. No recent exposure to loud noises. No ataxia. No focal weakness. She is very concerned because of positive family history of brain tumor in a couple relatives. Apparently one of them presented with similar types of symptoms. No aspirin use. No history of any other specific risk factors for tinnitus.  Past Medical History  Diagnosis Date  . Asthma     childhood  . Chicken pox   . Depression   . Hypertension   . Seizures     STARTED 21/2 YEARS AGO   Past Surgical History  Procedure Laterality Date  . Abdominal hysterectomy  1989    partial, menorrhagia    reports that she has quit smoking. She has never used smokeless tobacco. She reports that she drinks about 4.2 ounces of alcohol per week. She reports that she does not use illicit drugs. family history includes Asthma in her other; Cancer in her brother, brother, and mother; Colon cancer in her brother and brother; Hyperlipidemia in her other. No Known Allergies    Review of Systems  Constitutional: Negative for fever and chills.  HENT: Positive for hearing loss. Negative for congestion, ear discharge and ear pain.   Eyes: Negative for visual disturbance.  Respiratory: Negative for shortness of breath.   Cardiovascular: Negative for chest pain.  Neurological: Negative for dizziness, tremors, seizures, weakness and headaches.  Psychiatric/Behavioral: Negative for confusion.       Objective:   Physical Exam  Constitutional: She is oriented  to person, place, and time. She appears well-developed and well-nourished.  HENT:  Right Ear: External ear normal.  Left Ear: External ear normal.  Eyes: Pupils are equal, round, and reactive to light.  Neck: Neck supple. No thyromegaly present.  Cardiovascular: Normal rate.   Pulmonary/Chest: Effort normal and breath sounds normal. No respiratory distress. She has no wheezes. She has no rales.  Neurological: She is alert and oriented to person, place, and time. No cranial nerve deficit.  Psychiatric: She has a normal mood and affect. Her behavior is normal.          Assessment & Plan:  Tinnitus with subjective left ear greater than right. She also has some subjective question of hearing loss left greater than right. Start with audiometry. Audiometry performed which shows mild impairment at all frequencies including 500, 2000, and 4000 Hz and symmetric in both ears. Given symptoms of asymmetric tinnitus we'll set up MRI to further assess

## 2014-02-03 NOTE — Progress Notes (Signed)
Pre visit review using our clinic review tool, if applicable. No additional management support is needed unless otherwise documented below in the visit note. 

## 2014-02-19 ENCOUNTER — Ambulatory Visit
Admission: RE | Admit: 2014-02-19 | Discharge: 2014-02-19 | Disposition: A | Discharge status: Home / Self Care | Payer: BC Managed Care – PPO | Source: Ambulatory Visit | Attending: Family Medicine | Admitting: Family Medicine

## 2014-02-19 DIAGNOSIS — H9312 Tinnitus, left ear: Secondary | ICD-10-CM

## 2014-04-28 ENCOUNTER — Ambulatory Visit: Payer: BC Managed Care – PPO | Admitting: Obstetrics & Gynecology

## 2014-04-30 ENCOUNTER — Ambulatory Visit: Payer: BC Managed Care – PPO | Admitting: Obstetrics & Gynecology

## 2014-06-26 ENCOUNTER — Other Ambulatory Visit: Payer: Self-pay

## 2014-06-26 MED ORDER — DIVALPROEX SODIUM 500 MG PO DR TAB: 500.0000 mg | DELAYED_RELEASE_TABLET | Freq: Two times a day (BID) | ORAL | Status: DC

## 2014-08-14 ENCOUNTER — Ambulatory Visit (INDEPENDENT_AMBULATORY_CARE_PROVIDER_SITE_OTHER): Payer: BC Managed Care – PPO | Admitting: Obstetrics & Gynecology

## 2014-08-14 ENCOUNTER — Encounter: Payer: Self-pay | Admitting: Obstetrics & Gynecology

## 2014-08-14 DIAGNOSIS — Z01419 Encounter for gynecological examination (general) (routine) without abnormal findings: Secondary | ICD-10-CM

## 2014-08-14 NOTE — Progress Notes (Signed)
Subjective:     Katie Faraone is a 63 y.o. female here for a routine exam.  Current complaints: none.    Personal health questionnaire:  Is patient Ashkenazi Jewish, have a family history of breast and/or ovarian cancer: no Is there a family history of uterine cancer diagnosed at age < 80, gastrointestinal cancer, urinary tract cancer, family member who is a Field seismologist syndrome-associated carrier: yes Is the patient overweight and hypertensive, family history of diabetes, personal history of gestational diabetes or PCOS: yes Is patient over 55, have PCOS,  family history of premature CHD under age 21, diabetes, smoke, have hypertension or peripheral artery disease:  yes At any time, has a partner hit, kicked or otherwise hurt or frightened you?: no Over the past 2 weeks, have you felt down, depressed or hopeless?: no Over the past 2 weeks, have you felt little interest or pleasure in doing things?:no   Gynecologic History No LMP recorded. Patient has had a hysterectomy. Last mammogram: 1/15. Results were: normal  Obstetric History OB History  Gravida Para Term Preterm AB SAB TAB Ectopic Multiple Living  3 2            # Outcome Date GA Lbr Len/2nd Weight Sex Delivery Anes PTL Lv  3 GRA           2 PAR           1 PAR               Past Medical History  Diagnosis Date  . Asthma     childhood  . Chicken pox   . Depression   . Hypertension   . Seizures     STARTED 21/2 YEARS AGO    Past Surgical History  Procedure Laterality Date  . Abdominal hysterectomy  1989    partial, menorrhagia    Current outpatient prescriptions:Calcium Carbonate-Vitamin D (CALCIUM + D PO), Take 1 capsule by mouth daily., Disp: , Rfl: ;  Cyanocobalamin (VITAMIN B-12 CR PO), Take 1 capsule by mouth daily., Disp: , Rfl: ;  divalproex (DEPAKOTE) 500 MG DR tablet, Take 1 tablet (500 mg total) by mouth 2 (two) times daily., Disp: 180 tablet, Rfl: 0 levETIRAcetam (KEPPRA) 500 MG tablet, Take 2 tablets (1,000 mg  total) by mouth every 12 (twelve) hours., Disp: 360 tablet, Rfl: 3;  losartan (COZAAR) 100 MG tablet, TAKE 1 TABLET EVERY DAY, Disp: 30 tablet, Rfl: 11;  meloxicam (MOBIC) 15 MG tablet, Take 1 tablet (15 mg total) by mouth daily., Disp: 30 tablet, Rfl: 5;  Multiple Vitamins-Minerals (MULTIVITAMIN PO), Take 1 capsule by mouth daily., Disp: , Rfl:  Simethicone (GAS-X PO), Take 1 capsule by mouth as needed., Disp: , Rfl:  No Known Allergies  History  Substance Use Topics  . Smoking status: Former Research scientist (life sciences)  . Smokeless tobacco: Never Used  . Alcohol Use: 4.2 oz/week    7 Glasses of wine per week     Comment: 1 GLASS WINE WITH DINNER    Family History  Problem Relation Age of Onset  . Cancer Mother     uterine,ovarian  . Asthma Other   . Hyperlipidemia Other   . Cancer Brother     colon-PASSEDA WAY 54  . Colon cancer Brother   . Cancer Brother     colon,stomach,brain  . Colon cancer Brother       Review of Systems  Constitutional: negative for fatigue and weight loss Respiratory: negative for cough and wheezing Cardiovascular: negative for chest pain,  fatigue and palpitations Gastrointestinal: negative for abdominal pain and change in bowel habits Musculoskeletal:negative for myalgias Neurological: negative for gait problems and tremors Behavioral/Psych: negative for abusive relationship, depression Endocrine: negative for temperature intolerance   Genitourinary:negative for abnormal menstrual periods, genital lesions, hot flashes, sexual problems and vaginal discharge Integument/breast: negative for breast lump, breast tenderness, nipple discharge and skin lesion(s)    Objective:       There were no vitals taken for this visit. General:   alert  Skin:   no rash or abnormalities  Lungs:   clear to auscultation bilaterally  Heart:   regular rate and rhythm, S1, S2 normal, no murmur, click, rub or gallop  Breasts:   normal without suspicious masses, skin or nipple changes or  axillary nodes  Abdomen:  normal findings: no organomegaly, soft, non-tender and no hernia  Pelvis:  External genitalia: normal general appearance Urinary system: urethral meatus normal and bladder without fullness, nontender Vaginal: normal without tenderness, induration or masses Adnexa: normal bimanual exam    Lab Review Urine pregnancy test Labs reviewed no Radiologic studies reviewed no    Assessment:    Healthy female exam.    Plan:    Education reviewed: calcium supplements, low fat, low cholesterol diet and weight bearing exercise.    Follow up as needed.

## 2014-08-14 NOTE — Patient Instructions (Signed)
Bone Health Our bones do many things. They provide structure, protect organs, anchor muscles, and store calcium. Adequate calcium in your diet and weight-bearing physical activity help build strong bones, improve bone amounts, and may reduce the risk of weakening of bones (osteoporosis) later in life. PEAK BONE MASS By age 62, the average woman has acquired most of her skeletal bone mass. A large decline occurs in older adults which increases the risk of osteoporosis. In women this occurs around the time of menopause. It is important for young girls to reach their peak bone mass in order to maintain bone health throughout life. A person with high bone mass as a young adult will be more likely to have a higher bone mass later in life. Not enough calcium consumption and physical activity early on could result in a failure to achieve optimum bone mass in adulthood. OSTEOPOROSIS Osteoporosis is a disease of the bones. It is defined as low bone mass with deterioration of bone structure. Osteoporosis leads to an increase risk of fractures with falls. These fractures commonly happen in the wrist, hip, and spine. While men and women of all ages and background can develop osteoporosis, some of the risk factors for osteoporosis are:  Female.  White.  Postmenopausal.  Older adults.  Small in body size.  Eating a diet low in calcium.  Physically inactive.  Smoking.  Use of some medications.  Family history. CALCIUM Calcium is a mineral needed by the body for healthy bones, teeth, and proper function of the heart, muscles, and nerves. The body cannot produce calcium so it must be absorbed through food. Good sources of calcium include:  Dairy products (low fat or nonfat milk, cheese, and yogurt).  Dark green leafy vegetables (bok choy and broccoli).  Calcium fortified foods (orange juice, cereal, bread, soy beverages, and tofu products).  Nuts (almonds). Recommended amounts of calcium vary  for individuals. RECOMMENDED CALCIUM INTAKES Age and Amount in mg per day  Children 1 to 3 years / 700 mg  Children 4 to 8 years / 1,000 mg  Children 9 to 13 years / 1,300 mg  Teens 14 to 18 years / 1,300 mg  Adults 19 to 50 years / 1,000 mg  Adult women 51 to 70 years / 1,200 mg  Adults 71 years and older / 1,200 mg  Pregnant and breastfeeding teens / 1,300 mg  Pregnant and breastfeeding adults / 1,000 mg Vitamin D also plays an important role in healthy bone development. Vitamin D helps in the absorption of calcium. WEIGHT-BEARING PHYSICAL ACTIVITY Regular physical activity has many positive health benefits. Benefits include strong bones. Weight-bearing physical activity early in life is important in reaching peak bone mass. Weight-bearing physical activities cause muscles and bones to work against gravity. Some examples of weight bearing physical activities include:  Walking, jogging, or running.  Field Hockey.  Jumping rope.  Dancing.  Soccer.  Tennis or Racquetball.  Stair climbing.  Basketball.  Hiking.  Weight lifting.  Aerobic fitness classes. Including weight-bearing physical activity into an exercise plan is a great way to keep bones healthy. Adults: Engage in at least 30 minutes of moderate physical activity on most, preferably all, days of the week. Children: Engage in at least 60 minutes of moderate physical activity on most, preferably all, days of the week. FOR MORE INFORMATION [place] Department of Agriculture, Center for Nutrition Policy and Promotion: www.cnpp.usda.gov National Osteoporosis Foundation: www.nof.org Document Released: 02/04/2004 Document Revised: 03/11/2013 Document Reviewed: 05/06/2009 ExitCare Patient Information   2015 [employer]. This information is not intended to replace advice given to you by your health care provider. Make sure you discuss any questions you have with your health care provider.  

## 2014-08-26 ENCOUNTER — Encounter: Payer: Self-pay | Admitting: Family Medicine

## 2014-08-26 ENCOUNTER — Ambulatory Visit: Payer: BC Managed Care – PPO | Admitting: Family Medicine

## 2014-08-26 ENCOUNTER — Ambulatory Visit (INDEPENDENT_AMBULATORY_CARE_PROVIDER_SITE_OTHER): Payer: BC Managed Care – PPO | Admitting: Family Medicine

## 2014-08-26 VITALS — BP 136/78 | HR 84 | Temp 98.3°F | Wt 140.0 lb

## 2014-08-26 DIAGNOSIS — J019 Acute sinusitis, unspecified: Secondary | ICD-10-CM

## 2014-08-26 MED ORDER — AMOXICILLIN 875 MG PO TABS: 875.0000 mg | ORAL_TABLET | Freq: Two times a day (BID) | ORAL | Status: DC

## 2014-08-26 NOTE — Progress Notes (Signed)
Pre visit review using our clinic review tool, if applicable. No additional management support is needed unless otherwise documented below in the visit note. 

## 2014-08-26 NOTE — Progress Notes (Signed)
   Subjective:    Patient ID: Glenda Francis, female    DOB: Apr 24, 1951, 63 y.o.   MRN: [id number]  Sinusitis Associated symptoms include congestion, ear pain and headaches. Pertinent negatives include no chills or sore throat.    One month ago URI symptoms and then improved.  Now for about 10 days nasal congestion, facial pain, headaches, bilateral ear ache.  No fever.  Green nasal discharge. Ibuprofen with some relief. Pansinusitis symptoms.  Past Medical History  Diagnosis Date  . Asthma     childhood  . Chicken pox   . Depression   . Hypertension   . Seizures     STARTED 21/2 YEARS AGO   Past Surgical History  Procedure Laterality Date  . Abdominal hysterectomy  1989    partial, menorrhagia    reports that she has quit smoking. She has never used smokeless tobacco. She reports that she drinks about 4.2 ounces of alcohol per week. She reports that she does not use illicit drugs. family history includes Asthma in her other; Cancer in her brother, brother, and mother; Colon cancer in her brother and brother; Hyperlipidemia in her other. No Known Allergies    Review of Systems  Constitutional: Negative for fever and chills.  HENT: Positive for congestion and ear pain. Negative for ear discharge and sore throat.   Neurological: Positive for headaches.       Objective:   Physical Exam  Constitutional: She appears well-developed and well-nourished. No distress.  HENT:  Right Ear: External ear normal.  Left Ear: External ear normal.  Mouth/Throat: Oropharynx is clear and moist.  Neck: Neck supple.  Cardiovascular: Normal rate.   Pulmonary/Chest: Effort normal and breath sounds normal. No respiratory distress. She has no wheezes. She has no rales.  Lymphadenopathy:    She has no cervical adenopathy.          Assessment & Plan:  Probable acute sinusitis.  Amoxicillin 875 mg po BID for 10 days.

## 2014-08-26 NOTE — Patient Instructions (Signed)

## 2014-09-17 ENCOUNTER — Encounter: Payer: Self-pay | Admitting: Nurse Practitioner

## 2014-09-17 ENCOUNTER — Ambulatory Visit (INDEPENDENT_AMBULATORY_CARE_PROVIDER_SITE_OTHER): Payer: BC Managed Care – PPO | Admitting: Nurse Practitioner

## 2014-09-17 ENCOUNTER — Encounter (INDEPENDENT_AMBULATORY_CARE_PROVIDER_SITE_OTHER): Payer: Self-pay

## 2014-09-17 VITALS — BP 127/71 | HR 74 | Ht <= 58 in | Wt 141.8 lb

## 2014-09-17 DIAGNOSIS — Z5181 Encounter for therapeutic drug level monitoring: Secondary | ICD-10-CM

## 2014-09-17 DIAGNOSIS — G40409 Other generalized epilepsy and epileptic syndromes, not intractable, without status epilepticus: Secondary | ICD-10-CM

## 2014-09-17 LAB — COMPREHENSIVE METABOLIC PANEL
ALT: 13 IU/L (ref 0–32)
AST: 18 IU/L (ref 0–40)
Albumin/Globulin Ratio: 1.5 (ref 1.1–2.5)
Albumin: 4.1 g/dL (ref 3.6–4.8)
Alkaline Phosphatase: 67 IU/L (ref 39–117)
BILIRUBIN TOTAL: 0.3 mg/dL (ref 0.0–1.2)
BUN/Creatinine Ratio: 14 (ref 11–26)
BUN: 10 mg/dL (ref 8–27)
CALCIUM: 9.6 mg/dL (ref 8.7–10.3)
CHLORIDE: 96 mmol/L (ref 96–108)
CO2: 31 mmol/L — ABNORMAL HIGH (ref 18–29)
Creatinine, Ser: 0.71 mg/dL (ref 0.57–1.00)
GFR calc Af Amer: 106 mL/min/{1.73_m2} (ref >59)
GFR calc non Af Amer: 92 mL/min/{1.73_m2} (ref >59)
GLUCOSE: 139 mg/dL — AB (ref 65–99)
Globulin, Total: 2.7 g/dL (ref 1.5–4.5)
POTASSIUM: 4 mmol/L (ref 3.5–5.2)
SODIUM: 136 mmol/L (ref 134–144)
TOTAL PROTEIN: 6.8 g/dL (ref 6.0–8.5)

## 2014-09-17 LAB — CBC WITH DIFFERENTIAL/PLATELET
Basophils Absolute: 0 10*3/uL (ref 0.0–0.2)
Basos: 1 %
Eos: 3 %
Eosinophils Absolute: 0.2 10*3/uL (ref 0.0–0.4)
HCT: 38.6 % (ref 34.0–46.6)
Hemoglobin: 13.6 g/dL (ref 11.1–15.9)
Lymphocytes Absolute: 2.9 10*3/uL (ref 0.7–3.1)
Lymphs: 48 %
MCH: 30.9 pg (ref 26.6–33.0)
MCHC: 35.2 g/dL (ref 31.5–35.7)
MCV: 88 fL (ref 79–97)
MONOS ABS: 0.6 10*3/uL (ref 0.1–0.9)
Monocytes: 11 %
Neutrophils Absolute: 2.3 10*3/uL (ref 1.4–7.0)
Neutrophils Relative %: 37 %
RBC: 4.4 x10E6/uL (ref 3.77–5.28)
RDW: 13.5 % (ref 12.3–15.4)
WBC: 6.1 10*3/uL (ref 3.4–10.8)

## 2014-09-17 MED ORDER — DIVALPROEX SODIUM 500 MG PO DR TAB: 500.0000 mg | DELAYED_RELEASE_TABLET | Freq: Two times a day (BID) | ORAL | Status: DC

## 2014-09-17 MED ORDER — LEVETIRACETAM 500 MG PO TABS: 1000.0000 mg | ORAL_TABLET | Freq: Two times a day (BID) | ORAL | Status: DC

## 2014-09-17 NOTE — Patient Instructions (Signed)
Continue Depakote and Keppra at current doses will refill for 3 months at a time Check labs today Followup yearly and when necessary

## 2014-09-17 NOTE — Progress Notes (Signed)
[facility]  PATIENT: Glenda Francis DOB: 02-Jan-1951   REASON FOR VISIT: for seizure disorder    HISTORY OF PRESENT ILLNESS:[name] is a 63 year old right-handed Hispanic female with a history of seizures. She was last seen 09/17/2013. This patient had been having what was felt to be panic attacks prior to her generalized seizures that occurred at night. The panic attacks were associated with amnesia and staring episodes at times. In retrospect, the daughter believes that the panic disorder was a manifestation of her seizures. EEG study done shows evidence of left brain sharp transients. MRI of the brain was unremarkable. Patient has been placed on Keppra, and is taking 500 mg tablets,total dose 2000mg . Patient does have some drowsiness on the medication. Patient continued to have events and Depakote was added 07/2011. No further seizure activity since depakote added. She is pleased with responce to meds. She is exercising, she feels good, and she denies side effects to her meds. She was back to driving . No new neurologic complaints . She returns for reevaluation.   REVIEW OF SYSTEMS: Full 14 system review of systems performed and notable only for those listed, all others are neg:  Constitutional: N/A  Cardiovascular: N/A  Ear/Nose/Throat: N/A  Skin: N/A  Eyes: N/A  Respiratory: N/A  Gastroitestinal: N/A  Hematology/Lymphatic: N/A  Endocrine: N/A Musculoskeletal:N/A  Allergy/Immunology: N/A  Neurological: N/A Psychiatric: N/A Sleep : NA   ALLERGIES: No Known Allergies  HOME MEDICATIONS: Outpatient Prescriptions Prior to Visit  Medication Sig Dispense Refill  . amoxicillin (AMOXIL) 875 MG tablet Take 1 tablet (875 mg total) by mouth 2 (two) times daily.  20 tablet  0  . Calcium Carbonate-Vitamin D (CALCIUM + D PO) Take 1 capsule by mouth daily.      . Cyanocobalamin (VITAMIN B-12 CR PO) Take 1 capsule by mouth daily.      . divalproex (DEPAKOTE) 500 MG DR  tablet Take 1 tablet (500 mg total) by mouth 2 (two) times daily.  180 tablet  0  . levETIRAcetam (KEPPRA) 500 MG tablet Take 2 tablets (1,000 mg total) by mouth every 12 (twelve) hours.  360 tablet  3  . losartan (COZAAR) 100 MG tablet TAKE 1 TABLET EVERY DAY  30 tablet  11  . meloxicam (MOBIC) 15 MG tablet Take 1 tablet (15 mg total) by mouth daily.  30 tablet  5  . Multiple Vitamins-Minerals (MULTIVITAMIN PO) Take 1 capsule by mouth daily.      . Simethicone (GAS-X PO) Take 1 capsule by mouth as needed.       No facility-administered medications prior to visit.    PAST MEDICAL HISTORY: Past Medical History  Diagnosis Date  . Asthma     childhood  . Chicken pox   . Depression   . Hypertension   . Seizures     STARTED 21/2 YEARS AGO    PAST SURGICAL HISTORY: Past Surgical History  Procedure Laterality Date  . Abdominal hysterectomy  1989    partial, menorrhagia    FAMILY HISTORY: Family History  Problem Relation Age of Onset  . Cancer Mother     uterine,ovarian  . Asthma Other   . Hyperlipidemia Other   . Cancer Brother     colon-PASSEDA WAY 54  . Colon cancer Brother   . Cancer Brother     colon,stomach,brain  . Colon cancer Brother     SOCIAL HISTORY: History   Social History  . Marital Status: Divorced    Spouse  Name: N/A    Number of Children: 3  . Years of Education: college   Occupational History  . retired    Social History Main Topics  . Smoking status: Former Research scientist (life sciences)  . Smokeless tobacco: Never Used  . Alcohol Use: 4.2 oz/week    7 Glasses of wine per week     Comment: 1 GLASS WINE WITH DINNER  . Drug Use: No  . Sexual Activity: Not Currently   Other Topics Concern  . Not on file   Social History Narrative   Patient is divorced   Patient  has 3 children.   Patient is retired   Patient has some college.   Patient is currently doing yoga and water aerobics.     PHYSICAL EXAM  Filed Vitals:   09/17/14 1342  BP: 127/71  Pulse: 74    Height: 4\' 10"  (1.473 m)  Weight: 141 lb 12.8 oz (64.32 kg)   Body mass index is 29.64 kg/(m^2). Generalized: Well developed, in no acute distress  Neurological examination  Mentation: Alert oriented to time, place, history taking. Follows all commands speech and language fluent  Cranial nerve II-XII: Pupils were equal round reactive to light extraocular movements were full, visual field were full on confrontational test. Facial sensation and strength were normal. hearing was intact to finger rubbing bilaterally. Uvula tongue midline. head turning and shoulder shrug and were normal and symmetric.Tongue protrusion into cheek strength was normal.  Motor: normal bulk and tone, full strength in the BUE, BLE, fine finger movements normal, no pronator drift. No focal weakness  Coordination: finger-nose-finger, heel-to-shin bilaterally, no dysmetria  Reflexes: Brachioradialis 2/2, biceps 2/2, triceps 2/2, patellar 2/2, Achilles 2/2, plantar responses were flexor bilaterally.  Gait and Station: Rising up from seated position without assistance, normal stance, without trunk ataxia, moderate stride, good arm swing, smooth turning, able to perform tiptoe, and heel walking without difficulty. Tandem stable, Romberg negative      DIAGNOSTIC DATA (LABS, IMAGING, TESTING) - I reviewed patient records, labs, notes, testing and imaging myself where available.  Lab Results  Component Value Date   WBC 4.8 09/17/2013   HGB 13.3 09/17/2013   HCT 38.4 09/17/2013   MCV 90 09/17/2013   PLT 160.0 08/21/2012      Component Value Date/Time   NA 132* 09/17/2013 1303   NA 138 08/21/2012 1102   K 3.8 09/17/2013 1303   CL 94* 09/17/2013 1303   CO2 32* 09/17/2013 1303   GLUCOSE 125* 09/17/2013 1303   GLUCOSE 103* 08/21/2012 1102   BUN 12 09/17/2013 1303   BUN 16 08/21/2012 1102   CREATININE 0.58 09/17/2013 1303   CALCIUM 9.5 09/17/2013 1303   PROT 6.6 09/17/2013 1303   PROT 7.0 08/21/2012 1102   ALBUMIN 3.9  08/21/2012 1102   AST 17 09/17/2013 1303   ALT 10 09/17/2013 1303   ALKPHOS 56 09/17/2013 1303   BILITOT 0.5 09/17/2013 1303   GFRNONAA 101 09/17/2013 1303   GFRAA 116 09/17/2013 1303     ASSESSMENT AND PLAN  63 y.o. year old female  has a past medical history of Seizures here to follow up. Last seizure event September 2012.  Continue Depakote and Keppra at current doses will refill for 3 months at a time Check labs today, CBC, CMP Followup yearly and when necessary Dennie Bible, [facility], [facility] ([place]), [facility] [address] [place], [place] [address] [phone]

## 2014-09-17 NOTE — Progress Notes (Signed)
I have read the note, and I agree with the clinical assessment and plan.  Lonn Im KEITH   

## 2014-09-29 ENCOUNTER — Encounter: Payer: Self-pay | Admitting: Nurse Practitioner

## 2014-10-15 ENCOUNTER — Encounter: Payer: Self-pay | Admitting: Neurology

## 2014-10-21 ENCOUNTER — Encounter: Payer: Self-pay | Admitting: Neurology

## 2014-11-24 ENCOUNTER — Encounter: Payer: Self-pay | Admitting: *Deleted

## 2014-11-25 ENCOUNTER — Encounter: Payer: Self-pay | Admitting: Obstetrics & Gynecology

## 2014-12-09 ENCOUNTER — Encounter: Payer: Self-pay | Admitting: Family Medicine

## 2014-12-09 ENCOUNTER — Ambulatory Visit (INDEPENDENT_AMBULATORY_CARE_PROVIDER_SITE_OTHER): Payer: BLUE CROSS/BLUE SHIELD | Admitting: Family Medicine

## 2014-12-09 VITALS — BP 140/80 | HR 72 | Temp 97.8°F | Wt 140.0 lb

## 2014-12-09 DIAGNOSIS — G4489 Other headache syndrome: Secondary | ICD-10-CM

## 2014-12-09 DIAGNOSIS — H1130 Conjunctival hemorrhage, unspecified eye: Secondary | ICD-10-CM

## 2014-12-09 LAB — CBC WITH DIFFERENTIAL/PLATELET
BASOS PCT: 0.5 % (ref 0.0–3.0)
Basophils Absolute: 0 10*3/uL (ref 0.0–0.1)
EOS PCT: 3.4 % (ref 0.0–5.0)
Eosinophils Absolute: 0.2 10*3/uL (ref 0.0–0.7)
HEMATOCRIT: 40.4 % (ref 36.0–46.0)
Hemoglobin: 13.6 g/dL (ref 12.0–15.0)
LYMPHS ABS: 2.3 10*3/uL (ref 0.7–4.0)
Lymphocytes Relative: 50.7 % — ABNORMAL HIGH (ref 12.0–46.0)
MCHC: 33.8 g/dL (ref 30.0–36.0)
MCV: 90.8 fl (ref 78.0–100.0)
MONO ABS: 0.5 10*3/uL (ref 0.1–1.0)
Monocytes Relative: 10.8 % (ref 3.0–12.0)
Neutro Abs: 1.6 10*3/uL (ref 1.4–7.7)
Neutrophils Relative %: 34.6 % — ABNORMAL LOW (ref 43.0–77.0)
Platelets: 175 10*3/uL (ref 150.0–400.0)
RBC: 4.45 Mil/uL (ref 3.87–5.11)
RDW: 12.4 % (ref 11.5–15.5)
WBC: 4.6 10*3/uL (ref 4.0–10.5)

## 2014-12-09 LAB — SEDIMENTATION RATE: SED RATE: 7 mm/h (ref 0–22)

## 2014-12-09 NOTE — Progress Notes (Signed)
   Subjective:    Patient ID: Glenda Francis, female    DOB: 1951/08/29, 64 y.o.   MRN: [id number]  HPI  Patient seen today for the following issues:  She's had apparently recurrent subconjunctival hemorrhages- about 3 or 4 over the past year. Denies any other bruising or bleeding issues. No bleeding from the gums. She denies any history of trauma. No associated visual changes with her subconjunctival hemorrhage. No contact use.  She relates intermittent atypical headaches which are usually left-sided. Frequent centered around the left eye. No history of migraine headaches. These are sometimes "sharp" quality and sometimes "dull" and vary in duration. She has history of seizure disorder followed by neurology. She had MRI scan little less than a year ago that showed no acute findings. No associated fevers or chills. No sinusitis symptoms.  Past Medical History  Diagnosis Date  . Asthma     childhood  . Chicken pox   . Depression   . Hypertension   . Seizures     STARTED 21/2 YEARS AGO   Past Surgical History  Procedure Laterality Date  . Abdominal hysterectomy  1989    partial, menorrhagia    reports that she has quit smoking. She has never used smokeless tobacco. She reports that she drinks about 4.2 oz of alcohol per week. She reports that she does not use illicit drugs. family history includes Asthma in her other; Cancer in her brother, brother, and mother; Colon cancer in her brother and brother; Hyperlipidemia in her other. No Known Allergies   Review of Systems  Constitutional: Negative for fatigue.  Eyes: Positive for redness. Negative for photophobia, pain, discharge and visual disturbance.  Respiratory: Negative for cough, chest tightness, shortness of breath and wheezing.   Cardiovascular: Negative for chest pain, palpitations and leg swelling.  Neurological: Positive for headaches. Negative for dizziness, seizures, syncope, weakness and light-headedness.  Hematological:  Does not bruise/bleed easily.       Objective:   Physical Exam  Constitutional: She is oriented to person, place, and time. She appears well-developed and well-nourished.  Eyes: Conjunctivae are normal. Pupils are equal, round, and reactive to light.  She has small left subconjunctival hemorrhage. No evidence for hyphema. Extraocular movements are intact. Funduscopic exam is benign. Cornea appears normal  Neck: Neck supple. No thyromegaly present.  Cardiovascular: Normal rate and regular rhythm.   Pulmonary/Chest: Effort normal and breath sounds normal. No respiratory distress. She has no wheezes. She has no rales.  Musculoskeletal: She exhibits no edema.  Lymphadenopathy:    She has no cervical adenopathy.  Neurological: She is alert and oriented to person, place, and time. No cranial nerve deficit. Coordination normal.          Assessment & Plan:  #1 recurrent benign appearing subconjunctival hemorrhage. Will check CBC since she's had recurrence of this but she's not having any other bruising or other indicators of likely bleeding issue #2 atypical headache. These seem to be left-sided. She has has some mild tenderness over her temporal region but somewhat bilateral. Check sedimentation rate but doubt temporal arteritis. She's not having consistent visual symptoms. Consider follow-up with neurology if normal-she already sees them regarding her seizure disorder.

## 2014-12-09 NOTE — Patient Instructions (Signed)
Subconjunctival Hemorrhage °A subconjunctival hemorrhage is a bright red patch covering a portion of the white of the eye. The white part of the eye is called the sclera, and it is covered by a thin membrane called the conjunctiva. This membrane is clear, except for tiny blood vessels that you can see with the naked eye. When your eye is irritated or inflamed and becomes red, it is because the vessels in the conjunctiva are swollen. °Sometimes, a blood vessel in the conjunctiva can break and bleed. When this occurs, the blood builds up between the conjunctiva and the sclera, and spreads out to create a red area. The red spot may be very small at first. It may then spread to cover a larger part of the surface of the eye, or even all of the visible white part of the eye. °In almost all cases, the blood will go away and the eye will become white again. Before completely dissolving, however, the red area may spread. It may also become brownish-yellow in color before going away. If a lot of blood collects under the conjunctiva, it may look like a bulge on the surface of the eye. This looks scary, but it will also eventually flatten out and go away. Subconjunctival hemorrhages do not cause pain, but if swollen, may cause a feeling of irritation. There is no effect on vision.  °CAUSES  °· The most common cause is mild trauma (rubbing the eye, irritation). °· Subconjunctival hemorrhages can happen because of coughing or straining (lifting heavy objects), vomiting, or sneezing. °· In some cases, your doctor may want to check your blood pressure. High blood pressure can also cause a subconjunctival hemorrhage. °· Severe trauma or blunt injuries. °· Diseases that affect blood clotting (hemophilia, leukemia). °· Abnormalities of blood vessels behind the eye (carotid cavernous sinus fistula). °· Tumors behind the eye. °· Certain drugs (aspirin, Coumadin, heparin). °· Recent eye surgery. °HOME CARE INSTRUCTIONS  °· Do not worry  about the appearance of your eye. You may continue your usual activities. °· Often, follow-up is not necessary. °SEEK MEDICAL CARE IF:  °· Your eye becomes painful. °· The bleeding does not disappear within 3 weeks. °· Bleeding occurs elsewhere, for example, under the skin, in the mouth, or in the other eye. °· You have recurring subconjunctival hemorrhages. °SEEK IMMEDIATE MEDICAL CARE IF:  °· Your vision changes or you have difficulty seeing. °· You develop a severe headache, persistent vomiting, confusion, or abnormal drowsiness (lethargy). °· Your eye seems to bulge or protrude from the eye socket. °· You notice the sudden appearance of bruises or have spontaneous bleeding elsewhere on your body. °Document Released: 11/14/2005 Document Revised: 03/31/2014 Document Reviewed: 10/12/2009 °ExitCare® Patient Information ©2015 [employer]. This information is not intended to replace advice given to you by your health care provider. Make sure you discuss any questions you have with your health care provider. ° °

## 2014-12-09 NOTE — Progress Notes (Signed)
Pre visit review using our clinic review tool, if applicable. No additional management support is needed unless otherwise documented below in the visit note. 

## 2014-12-24 ENCOUNTER — Other Ambulatory Visit: Payer: Self-pay | Admitting: Family Medicine

## 2015-01-27 ENCOUNTER — Encounter: Payer: Self-pay | Admitting: Internal Medicine

## 2015-01-27 ENCOUNTER — Ambulatory Visit (INDEPENDENT_AMBULATORY_CARE_PROVIDER_SITE_OTHER): Payer: BLUE CROSS/BLUE SHIELD | Admitting: Internal Medicine

## 2015-01-27 VITALS — BP 140/90 | HR 69 | Temp 98.1°F | Resp 20 | Ht <= 58 in | Wt 140.0 lb

## 2015-01-27 DIAGNOSIS — I10 Essential (primary) hypertension: Secondary | ICD-10-CM

## 2015-01-27 DIAGNOSIS — R300 Dysuria: Secondary | ICD-10-CM

## 2015-01-27 DIAGNOSIS — M545 Low back pain, unspecified: Secondary | ICD-10-CM

## 2015-01-27 LAB — POCT URINALYSIS DIPSTICK
Bilirubin, UA: NEGATIVE
Glucose, UA: NEGATIVE
KETONES UA: NEGATIVE
LEUKOCYTES UA: NEGATIVE
Nitrite, UA: NEGATIVE
PH UA: 7
PROTEIN UA: NEGATIVE
Spec Grav, UA: 1.015
Urobilinogen, UA: 0.2

## 2015-01-27 NOTE — Progress Notes (Signed)
Pre visit review using our clinic review tool, if applicable. No additional management support is needed unless otherwise documented below in the visit note. 

## 2015-01-27 NOTE — Progress Notes (Signed)
Subjective:    Patient ID: Glenda Francis, female    DOB: Sep 05, 1951, 64 y.o.   MRN: [id number]  HPI 64 year old patient who has treated hypertension.  She also has a history of a seizure disorder.  The past few days she has had some burning dysuria and frequency.  She also has had some minor low back pain.  She has a prior history of remote UTIs but not for some time.  No fever, chills or flank pain.  Past Medical History  Diagnosis Date  . Asthma     childhood  . Chicken pox   . Depression   . Hypertension   . Seizures     STARTED 21/2 YEARS AGO    History   Social History  . Marital Status: Divorced    Spouse Name: N/A  . Number of Children: 3  . Years of Education: college   Occupational History  . retired    Social History Main Topics  . Smoking status: Former Research scientist (life sciences)  . Smokeless tobacco: Never Used  . Alcohol Use: 4.2 oz/week    7 Glasses of wine per week     Comment: 1 GLASS WINE WITH DINNER  . Drug Use: No  . Sexual Activity: Not Currently   Other Topics Concern  . Not on file   Social History Narrative   Patient is divorced   Patient  has 3 children.   Patient is retired   Patient has some college.   Patient is currently doing yoga and water aerobics.    Past Surgical History  Procedure Laterality Date  . Abdominal hysterectomy  1989    partial, menorrhagia    Family History  Problem Relation Age of Onset  . Cancer Mother     uterine,ovarian  . Asthma Other   . Hyperlipidemia Other   . Cancer Brother     colon-PASSEDA WAY 54  . Colon cancer Brother   . Cancer Brother     colon,stomach,brain  . Colon cancer Brother     No Known Allergies  Current Outpatient Prescriptions on File Prior to Visit  Medication Sig Dispense Refill  . Calcium Carbonate-Vitamin D (CALCIUM + D PO) Take 1 capsule by mouth daily.    . Cyanocobalamin (VITAMIN B-12 CR PO) Take 1 capsule by mouth daily.    . divalproex (DEPAKOTE) 500 MG DR tablet Take 1 tablet (500  mg total) by mouth 2 (two) times daily. 180 tablet 3  . levETIRAcetam (KEPPRA) 500 MG tablet Take 2 tablets (1,000 mg total) by mouth every 12 (twelve) hours. 360 tablet 3  . losartan (COZAAR) 100 MG tablet TAKE 1 TABLET BY MOUTH EVERY DAY 30 tablet 5  . meloxicam (MOBIC) 15 MG tablet Take 1 tablet (15 mg total) by mouth daily. 30 tablet 5  . Multiple Vitamins-Minerals (MULTIVITAMIN PO) Take 1 capsule by mouth daily.    . Simethicone (GAS-X PO) Take 1 capsule by mouth as needed.     No current facility-administered medications on file prior to visit.    BP 140/90 mmHg  Pulse 69  Temp(Src) 98.1 F (36.7 C) (Oral)  Resp 20  Ht 4\' 10"  (1.473 m)  Wt 140 lb (63.504 kg)  BMI 29.27 kg/m2  SpO2 98%      Review of Systems  Constitutional: Negative.   HENT: Negative for congestion, dental problem, hearing loss, rhinorrhea, sinus pressure, sore throat and tinnitus.   Eyes: Negative for pain, discharge and visual disturbance.  Respiratory: Negative for cough  and shortness of breath.   Cardiovascular: Negative for chest pain, palpitations and leg swelling.  Gastrointestinal: Negative for nausea, vomiting, abdominal pain, diarrhea, constipation, blood in stool and abdominal distention.  Genitourinary: Positive for dysuria, frequency and difficulty urinating. Negative for urgency, hematuria, flank pain, vaginal bleeding, vaginal discharge, vaginal pain and pelvic pain.  Musculoskeletal: Negative for joint swelling, arthralgias and gait problem.  Skin: Negative for rash.  Neurological: Negative for dizziness, syncope, speech difficulty, weakness, numbness and headaches.  Hematological: Negative for adenopathy.  Psychiatric/Behavioral: Negative for behavioral problems, dysphoric mood and agitation. The patient is not nervous/anxious.        Objective:   Physical Exam  Constitutional: She is oriented to person, place, and time. She appears well-developed and well-nourished. No distress.    Repeat blood pressure 134/80  HENT:  Head: Normocephalic.  Right Ear: External ear normal.  Left Ear: External ear normal.  Mouth/Throat: Oropharynx is clear and moist.  Eyes: Conjunctivae and EOM are normal. Pupils are equal, round, and reactive to light.  Neck: Normal range of motion. Neck supple. No thyromegaly present.  Cardiovascular: Normal rate, regular rhythm, normal heart sounds and intact distal pulses.   Pulmonary/Chest: Effort normal and breath sounds normal.  Abdominal: Soft. Bowel sounds are normal. She exhibits no distension and no mass. There is tenderness. There is no rebound and no guarding.  Mild suprapubic tenderness  Musculoskeletal: Normal range of motion.  Lymphadenopathy:    She has no cervical adenopathy.  Neurological: She is alert and oriented to person, place, and time.  Skin: Skin is warm and dry. No rash noted.  Psychiatric: She has a normal mood and affect. Her behavior is normal.          Assessment & Plan:   Urinary frequency and dysuria.  Urinalysis revealed no pyuria.  Will force fluids and clinically observe at least for 24 hours Hypertension, stable.  Repeat blood pressure 134/80

## 2015-01-27 NOTE — Patient Instructions (Signed)
Drink as much fluid as you  can tolerate over the next few days  Call or return to clinic prn if these symptoms worsen or fail to improve as anticipated.   Avoid caffeinated products

## 2015-01-28 ENCOUNTER — Telehealth: Payer: Self-pay | Admitting: Family Medicine

## 2015-01-28 NOTE — Telephone Encounter (Signed)
emmi mailed  °

## 2015-06-21 ENCOUNTER — Other Ambulatory Visit: Payer: Self-pay | Admitting: Family Medicine

## 2015-06-25 ENCOUNTER — Other Ambulatory Visit: Payer: Self-pay | Admitting: Family Medicine

## 2015-08-20 ENCOUNTER — Encounter: Payer: Self-pay | Admitting: Neurology

## 2015-08-20 ENCOUNTER — Ambulatory Visit (INDEPENDENT_AMBULATORY_CARE_PROVIDER_SITE_OTHER): Payer: BLUE CROSS/BLUE SHIELD | Admitting: Neurology

## 2015-08-20 VITALS — BP 142/82 | HR 74 | Ht 60.0 in | Wt 142.5 lb

## 2015-08-20 DIAGNOSIS — Z5181 Encounter for therapeutic drug level monitoring: Secondary | ICD-10-CM | POA: Diagnosis not present

## 2015-08-20 DIAGNOSIS — G4486 Cervicogenic headache: Secondary | ICD-10-CM

## 2015-08-20 DIAGNOSIS — R51 Headache: Secondary | ICD-10-CM | POA: Diagnosis not present

## 2015-08-20 DIAGNOSIS — G40409 Other generalized epilepsy and epileptic syndromes, not intractable, without status epilepticus: Secondary | ICD-10-CM | POA: Diagnosis not present

## 2015-08-20 HISTORY — DX: Cervicogenic headache: G44.86

## 2015-08-20 MED ORDER — NORTRIPTYLINE HCL 10 MG PO CAPS: ORAL_CAPSULE | ORAL | Status: DC

## 2015-08-20 NOTE — Progress Notes (Signed)
Reason for visit: Headache  Referring physician: Dr. [name] is a 64 y.o. female  History of present illness:  Glenda Francis is a 64 year old right-handed Hispanic female right-handed Hispanic female with a history of seizures associated with a panic attack like event followed by staring episodes or generalized seizure. The patient has done quite well on Keppra and Depakote combination, she has not had any further seizures since last seen. The patient is now back to driving a motor vehicle without difficulty. However, over the last 3 months, she has developed some headache discomfort on the left side. She initially was seen by her ophthalmologist, she was felt to have cataracts, but no etiology of her headache pain was noted. The patient reports a chronic history of left neck and shoulder discomfort that has been present for greater than one year. The patient indicates that the headaches will come up from the back of the head and go around the left eye associated with sharp pains. The patient notes a parallel between the neck and shoulder discomfort and the severity of the headache. She will also have some discomfort down into the arm, and she occasionally will have some dizziness. The patient denies any falls. She denies issues controlling the bowels or the bladder. She feels as if the neck is stiff. She comes to this office for further evaluation. She denies any pre-existing history of headache before 3 months ago.  Past Medical History  Diagnosis Date  . Asthma     childhood  . Chicken pox   . Depression   . Hypertension   . Seizures     STARTED 21/2 YEARS AGO  . Cervicogenic headache 08/20/2015    Left side    Past Surgical History  Procedure Laterality Date  . Abdominal hysterectomy  1989    partial, menorrhagia    Family History  Problem Relation Age of Onset  . Cancer Mother     uterine,ovarian  . Asthma Other   . Hyperlipidemia Other   . Cancer Brother     colon-[address] 54,  pancreatic  . Colon cancer Brother   . Cancer Brother     colon,stomach,brain  . Colon cancer Brother     Social history:  reports that she has quit smoking. She has never used smokeless tobacco. She reports that she does not drink alcohol or use illicit drugs.  Medications:  Prior to Admission medications   Medication Sig Start Date End Date Taking? Authorizing Provider  Calcium Carbonate-Vitamin D (CALCIUM + D PO) Take 1 capsule by mouth daily.   Yes Historical Provider, MD  Cyanocobalamin (VITAMIN B-12 CR PO) Take 1 capsule by mouth daily.   Yes Historical Provider, MD  divalproex (DEPAKOTE) 500 MG DR tablet Take 1 tablet (500 mg total) by mouth 2 (two) times daily. 09/17/14  Yes Dennie Bible, NP  levETIRAcetam (KEPPRA) 500 MG tablet Take 2 tablets (1,000 mg total) by mouth every 12 (twelve) hours. 09/17/14  Yes Dennie Bible, NP  losartan (COZAAR) 100 MG tablet TAKE 1 TABLET BY MOUTH EVERY DAY 06/22/15  Yes Eulas Post, MD  losartan (COZAAR) 100 MG tablet TAKE 1 TABLET BY MOUTH EVERY DAY 06/25/15  Yes Eulas Post, MD  Multiple Vitamins-Minerals (MULTIVITAMIN PO) Take 1 capsule by mouth daily.   Yes Historical Provider, MD  Polyvinyl Alcohol-Povidone (REFRESH OP) Apply 1 drop to eye 2 (two) times daily.   Yes Historical Provider, MD  Propylene Glycol (SYSTANE BALANCE) 0.6 % SOLN  Apply 1 drop to eye 2 (two) times daily.   Yes Historical Provider, MD  Simethicone (GAS-X PO) Take 1 capsule by mouth as needed.   Yes Historical Provider, MD     No Known Allergies  ROS:  Out of a complete 14 system review of symptoms, the patient complains only of the following symptoms, and all other reviewed systems are negative.  Headache Neck pain, shoulder pain  Blood pressure 142/82, pulse 74, height 5' (1.524 m), weight 142 lb 8 oz (64.638 kg).  Physical Exam  General: The patient is alert and cooperative at the time of the examination.  Eyes: Pupils are equal,  round, and reactive to light. Discs are flat bilaterally.  Neck: The neck is supple, no carotid bruits are noted.  Respiratory: The respiratory examination is clear.  Cardiovascular: The cardiovascular examination reveals a regular rate and rhythm, no obvious murmurs or rubs are noted.  Neuromuscular: Range of movement of the cervical spine appears to be full.  Skin: Extremities are without significant edema.  Neurologic Exam  Mental status: The patient is alert and oriented x 3 at the time of the examination. The patient has apparent normal recent and remote memory, with an apparently normal attention span and concentration ability.  Cranial nerves: Facial symmetry is present. There is good sensation of the face to pinprick and soft touch on the right, decreased pinprick on the left forehead. The patient does not split the midline with vibration sensation on the forehead. The strength of the facial muscles and the muscles to head turning and shoulder shrug are normal bilaterally. Speech is well enunciated, no aphasia or dysarthria is noted. Extraocular movements are full. Visual fields are full. The tongue is midline, and the patient has symmetric elevation of the soft palate. No obvious hearing deficits are noted.  Motor: The motor testing reveals 5 over 5 strength of all 4 extremities. Good symmetric motor tone is noted throughout.  Sensory: Sensory testing is notable for some decrease in pinprick sensation on the left leg relative to the right, symmetric on the arms. The patient has decreased vibration sensation on the left arm and leg, position sense is intact on all fours. No evidence of extinction is noted.  Coordination: Cerebellar testing reveals good finger-nose-finger and heel-to-shin bilaterally.  Gait and station: Gait is normal. Tandem gait is normal. Romberg is negative. No drift is seen.  Reflexes: Deep tendon reflexes are symmetric and normal bilaterally. Toes are downgoing  bilaterally.   Assessment/Plan:  1. Probable cervicogenic headache  2. History of seizures, well controlled  The patient reports neck and shoulder discomfort on the left, with head pain on the left side. The neck and shoulder discomfort parallels the headache pain. However, the patient appears to have some decreased pinprick sensation on the left forehead, arm, and leg. The patient be sent for MRI evaluation of the brain for this reason. He will be placed on nortriptyline for the headache, and she will be sent for physical therapy for neuromuscular therapy of the neck and shoulder. We will check blood work today to include a sedimentation rate, Depakote level, CBC and comprehensive metabolic profile. She will follow-up in 3 months.  Jill Alexanders MD 08/20/2015 7:10 PM  [facility] [address] [place], [place] [address]  Phone [phone] Fax [phone]

## 2015-08-20 NOTE — Patient Instructions (Addendum)
We will get blood work today, and set you up for MRI evaluation of the brain. We will start a medication called nortriptyline to help the headache, and get you set up for physical therapy on the neck and shoulders. We will follow-up in office.  Pamelor (nortriptyline) is an antidepressant medication that has many uses that may include headache, whiplash injuries, or for peripheral neuropathy pain. Side effects may include drowsiness, dry mouth, blurred vision, or constipation. As with any antidepressant medication, worsening depression may occur. If you had any significant side effects, please call our office. The full effects of this medication may take 7-10 days after starting the drug, or going up on the dose.  Headaches, Frequently Asked Questions MIGRAINE HEADACHES Q: What is migraine? What causes it? How can I treat it? A: Generally, migraine headaches begin as a dull ache. Then they develop into a constant, throbbing, and pulsating pain. You may experience pain at the temples. You may experience pain at the front or back of one or both sides of the head. The pain is usually accompanied by a combination of:  Nausea.  Vomiting.  Sensitivity to light and noise. Some people (about 15%) experience an aura (see below) before an attack. The cause of migraine is believed to be chemical reactions in the brain. Treatment for migraine may include over-the-counter or prescription medications. It may also include self-help techniques. These include relaxation training and biofeedback.  Q: What is an aura? A: About 15% of people with migraine get an "aura". This is a sign of neurological symptoms that occur before a migraine headache. You may see wavy or jagged lines, dots, or flashing lights. You might experience tunnel vision or blind spots in one or both eyes. The aura can include visual or auditory hallucinations (something imagined). It may include disruptions in smell (such as strange odors),  taste or touch. Other symptoms include:  Numbness.  A "pins and needles" sensation.  Difficulty in recalling or speaking the correct word. These neurological events may last as long as 60 minutes. These symptoms will fade as the headache begins. Q: What is a trigger? A: Certain physical or environmental factors can lead to or "trigger" a migraine. These include:  Foods.  Hormonal changes.  Weather.  Stress. It is important to remember that triggers are different for everyone. To help prevent migraine attacks, you need to figure out which triggers affect you. Keep a headache diary. This is a good way to track triggers. The diary will help you talk to your healthcare professional about your condition. Q: Does weather affect migraines? A: Bright sunshine, hot, humid conditions, and drastic changes in barometric pressure may lead to, or "trigger," a migraine attack in some people. But studies have shown that weather does not act as a trigger for everyone with migraines. Q: What is the link between migraine and hormones? A: Hormones start and regulate many of your body's functions. Hormones keep your body in balance within a constantly changing environment. The levels of hormones in your body are unbalanced at times. Examples are during menstruation, pregnancy, or menopause. That can lead to a migraine attack. In fact, about three quarters of all women with migraine report that their attacks are related to the menstrual cycle.  Q: Is there an increased risk of stroke for migraine sufferers? A: The likelihood of a migraine attack causing a stroke is very remote. That is not to say that migraine sufferers cannot have a stroke associated with their migraines.  In persons under age 58, the most common associated factor for stroke is migraine headache. But over the course of a person's normal life span, the occurrence of migraine headache may actually be associated with a reduced risk of dying from  cerebrovascular disease due to stroke.  Q: What are acute medications for migraine? A: Acute medications are used to treat the pain of the headache after it has started. Examples over-the-counter medications, NSAIDs, ergots, and triptans.  Q: What are the triptans? A: Triptans are the newest class of abortive medications. They are specifically targeted to treat migraine. Triptans are vasoconstrictors. They moderate some chemical reactions in the brain. The triptans work on receptors in your brain. Triptans help to restore the balance of a neurotransmitter called serotonin. Fluctuations in levels of serotonin are thought to be a main cause of migraine.  Q: Are over-the-counter medications for migraine effective? A: Over-the-counter, or "OTC," medications may be effective in relieving mild to moderate pain and associated symptoms of migraine. But you should see your caregiver before beginning any treatment regimen for migraine.  Q: What are preventive medications for migraine? A: Preventive medications for migraine are sometimes referred to as "prophylactic" treatments. They are used to reduce the frequency, severity, and length of migraine attacks. Examples of preventive medications include antiepileptic medications, antidepressants, beta-blockers, calcium channel blockers, and NSAIDs (nonsteroidal anti-inflammatory drugs). Q: Why are anticonvulsants used to treat migraine? A: During the past few years, there has been an increased interest in antiepileptic drugs for the prevention of migraine. They are sometimes referred to as "anticonvulsants". Both epilepsy and migraine may be caused by similar reactions in the brain.  Q: Why are antidepressants used to treat migraine? A: Antidepressants are typically used to treat people with depression. They may reduce migraine frequency by regulating chemical levels, such as serotonin, in the brain.  Q: What alternative therapies are used to treat migraine? A: The  term "alternative therapies" is often used to describe treatments considered outside the scope of conventional Western medicine. Examples of alternative therapy include acupuncture, acupressure, and yoga. Another common alternative treatment is herbal therapy. Some herbs are believed to relieve headache pain. Always discuss alternative therapies with your caregiver before proceeding. Some herbal products contain arsenic and other toxins. TENSION HEADACHES Q: What is a tension-type headache? What causes it? How can I treat it? A: Tension-type headaches occur randomly. They are often the result of temporary stress, anxiety, fatigue, or anger. Symptoms include soreness in your temples, a tightening band-like sensation around your head (a "vice-like" ache). Symptoms can also include a pulling feeling, pressure sensations, and contracting head and neck muscles. The headache begins in your forehead, temples, or the back of your head and neck. Treatment for tension-type headache may include over-the-counter or prescription medications. Treatment may also include self-help techniques such as relaxation training and biofeedback. CLUSTER HEADACHES Q: What is a cluster headache? What causes it? How can I treat it? A: Cluster headache gets its name because the attacks come in groups. The pain arrives with little, if any, warning. It is usually on one side of the head. A tearing or bloodshot eye and a runny nose on the same side of the headache may also accompany the pain. Cluster headaches are believed to be caused by chemical reactions in the brain. They have been described as the most severe and intense of any headache type. Treatment for cluster headache includes prescription medication and oxygen. SINUS HEADACHES Q: What is a sinus headache? What causes  it? How can I treat it? A: When a cavity in the bones of the face and skull (a sinus) becomes inflamed, the inflammation will cause localized pain. This condition  is usually the result of an allergic reaction, a tumor, or an infection. If your headache is caused by a sinus blockage, such as an infection, you will probably have a fever. An x-ray will confirm a sinus blockage. Your caregiver's treatment might include antibiotics for the infection, as well as antihistamines or decongestants.  REBOUND HEADACHES Q: What is a rebound headache? What causes it? How can I treat it? A: A pattern of taking acute headache medications too often can lead to a condition known as "rebound headache." A pattern of taking too much headache medication includes taking it more than 2 days per week or in excessive amounts. That means more than the label or a caregiver advises. With rebound headaches, your medications not only stop relieving pain, they actually begin to cause headaches. Doctors treat rebound headache by tapering the medication that is being overused. Sometimes your caregiver will gradually substitute a different type of treatment or medication. Stopping may be a challenge. Regularly overusing a medication increases the potential for serious side effects. Consult a caregiver if you regularly use headache medications more than 2 days per week or more than the label advises. ADDITIONAL QUESTIONS AND ANSWERS Q: What is biofeedback? A: Biofeedback is a self-help treatment. Biofeedback uses special equipment to monitor your body's involuntary physical responses. Biofeedback monitors:  Breathing.  Pulse.  Heart rate.  Temperature.  Muscle tension.  Brain activity. Biofeedback helps you refine and perfect your relaxation exercises. You learn to control the physical responses that are related to stress. Once the technique has been mastered, you do not need the equipment any more. Q: Are headaches hereditary? A: Four out of five (80%) of people that suffer report a family history of migraine. Scientists are not sure if this is genetic or a family predisposition. Despite  the uncertainty, a child has a 50% chance of having migraine if one parent suffers. The child has a 75% chance if both parents suffer.  Q: Can children get headaches? A: By the time they reach high school, most young people have experienced some type of headache. Many safe and effective approaches or medications can prevent a headache from occurring or stop it after it has begun.  Q: What type of doctor should I see to diagnose and treat my headache? A: Start with your primary caregiver. Discuss his or her experience and approach to headaches. Discuss methods of classification, diagnosis, and treatment. Your caregiver may decide to recommend you to a headache specialist, depending upon your symptoms or other physical conditions. Having diabetes, allergies, etc., may require a more comprehensive and inclusive approach to your headache. The National Head[employer] Foundation will provide, upon request, a list of [facility] physician members in your state. Document Released: 02/04/2004 Document Revised: 02/06/2012 Document Reviewed: 07/14/2008 [facility] Patient Information 2015 [place], [place]. This information is not intended to replace advice given to you by your health care provider. Make sure you discuss any questions you have with your health care provider.

## 2015-08-21 LAB — COMPREHENSIVE METABOLIC PANEL
A/G RATIO: 1.5 (ref 1.1–2.5)
ALT: 13 IU/L (ref 0–32)
AST: 18 IU/L (ref 0–40)
Albumin: 4.1 g/dL (ref 3.6–4.8)
Alkaline Phosphatase: 57 IU/L (ref 39–117)
BUN/Creatinine Ratio: 23 (ref 11–26)
BUN: 16 mg/dL (ref 8–27)
Bilirubin Total: 0.4 mg/dL (ref 0.0–1.2)
CALCIUM: 9.5 mg/dL (ref 8.7–10.3)
CO2: 28 mmol/L (ref 18–29)
Chloride: 96 mmol/L — ABNORMAL LOW (ref 97–108)
Creatinine, Ser: 0.7 mg/dL (ref 0.57–1.00)
GFR, EST AFRICAN AMERICAN: 107 mL/min/{1.73_m2} (ref >59)
GFR, EST NON AFRICAN AMERICAN: 93 mL/min/{1.73_m2} (ref >59)
GLOBULIN, TOTAL: 2.8 g/dL (ref 1.5–4.5)
Glucose: 133 mg/dL — ABNORMAL HIGH (ref 65–99)
POTASSIUM: 4.5 mmol/L (ref 3.5–5.2)
SODIUM: 140 mmol/L (ref 134–144)
TOTAL PROTEIN: 6.9 g/dL (ref 6.0–8.5)

## 2015-08-21 LAB — CBC WITH DIFFERENTIAL/PLATELET
BASOS ABS: 0 10*3/uL (ref 0.0–0.2)
BASOS: 1 %
EOS (ABSOLUTE): 0.5 10*3/uL — ABNORMAL HIGH (ref 0.0–0.4)
Eos: 9 %
Hematocrit: 40.1 % (ref 34.0–46.6)
Hemoglobin: 13.5 g/dL (ref 11.1–15.9)
Immature Grans (Abs): 0 10*3/uL (ref 0.0–0.1)
Immature Granulocytes: 0 %
LYMPHS ABS: 2.5 10*3/uL (ref 0.7–3.1)
LYMPHS: 44 %
MCH: 30.4 pg (ref 26.6–33.0)
MCHC: 33.7 g/dL (ref 31.5–35.7)
MCV: 90 fL (ref 79–97)
MONOS ABS: 0.7 10*3/uL (ref 0.1–0.9)
Monocytes: 12 %
NEUTROS ABS: 1.9 10*3/uL (ref 1.4–7.0)
Neutrophils: 34 %
Platelets: 193 10*3/uL (ref 150–379)
RBC: 4.44 x10E6/uL (ref 3.77–5.28)
RDW: 13.8 % (ref 12.3–15.4)
WBC: 5.6 10*3/uL (ref 3.4–10.8)

## 2015-08-21 LAB — VALPROIC ACID LEVEL: VALPROIC ACID LVL: 80 ug/mL (ref 50–100)

## 2015-08-21 LAB — SEDIMENTATION RATE: Sed Rate: 2 mm/hr (ref 0–40)

## 2015-08-24 ENCOUNTER — Telehealth: Payer: Self-pay

## 2015-08-24 NOTE — Telephone Encounter (Signed)
I called the patient and relayed results. 

## 2015-08-24 NOTE — Telephone Encounter (Signed)
-----   Message from Kathrynn Ducking, MD sent at 08/21/2015  3:02 PM EDT ----- Blood work is unremarkable. Depakote is in the therapeutic range. No dosing adjustment. ----- Message -----    From: [facility] Lab Results In Interface    Sent: 08/21/2015   5:41 AM      To: Kathrynn Ducking, MD

## 2015-08-27 ENCOUNTER — Ambulatory Visit: Payer: BC Managed Care – PPO | Admitting: Obstetrics & Gynecology

## 2015-09-02 ENCOUNTER — Ambulatory Visit (INDEPENDENT_AMBULATORY_CARE_PROVIDER_SITE_OTHER): Payer: BLUE CROSS/BLUE SHIELD

## 2015-09-02 DIAGNOSIS — G4486 Cervicogenic headache: Secondary | ICD-10-CM

## 2015-09-02 DIAGNOSIS — Z5181 Encounter for therapeutic drug level monitoring: Secondary | ICD-10-CM | POA: Diagnosis not present

## 2015-09-02 DIAGNOSIS — G40409 Other generalized epilepsy and epileptic syndromes, not intractable, without status epilepticus: Secondary | ICD-10-CM | POA: Diagnosis not present

## 2015-09-02 DIAGNOSIS — R51 Headache: Secondary | ICD-10-CM | POA: Diagnosis not present

## 2015-09-04 ENCOUNTER — Telehealth: Payer: Self-pay | Admitting: Neurology

## 2015-09-04 NOTE — Telephone Encounter (Signed)
I called the patient. MRI the brain is unchanged from March 2015, very minimal white matter changes, almost normal. No evidence of anything that will cause headaches or left-sided numbness. I discussed this with the patient. She is undergoing physical therapy, and being treated with nortriptyline, she will follow-up in about one month.   MRI brain 09/03/2015:  IMPRESSION:  Equivocal MRI brain (without) demonstrating: 1. Minimal periventricular and subcortical foci of non-specific gliosis. 2. No acute findings. 3. No change from MRI on 02/19/14.

## 2015-09-22 ENCOUNTER — Ambulatory Visit: Payer: BC Managed Care – PPO | Admitting: Nurse Practitioner

## 2015-11-05 ENCOUNTER — Other Ambulatory Visit: Payer: Self-pay | Admitting: *Deleted

## 2015-11-05 ENCOUNTER — Other Ambulatory Visit: Payer: Self-pay

## 2015-11-05 MED ORDER — DIVALPROEX SODIUM 500 MG PO DR TAB
500.0000 mg | DELAYED_RELEASE_TABLET | Freq: Two times a day (BID) | ORAL | Status: DC
Start: 2015-11-05 — End: 2016-05-21

## 2015-11-05 MED ORDER — LEVETIRACETAM 500 MG PO TABS: 1000.0000 mg | ORAL_TABLET | Freq: Two times a day (BID) | ORAL | Status: DC

## 2015-11-24 ENCOUNTER — Ambulatory Visit (INDEPENDENT_AMBULATORY_CARE_PROVIDER_SITE_OTHER): Payer: BLUE CROSS/BLUE SHIELD | Admitting: Nurse Practitioner

## 2015-11-24 ENCOUNTER — Encounter: Payer: Self-pay | Admitting: Nurse Practitioner

## 2015-11-24 VITALS — BP 122/71 | HR 76 | Ht 60.0 in | Wt 140.2 lb

## 2015-11-24 DIAGNOSIS — R51 Headache: Secondary | ICD-10-CM

## 2015-11-24 DIAGNOSIS — G40409 Other generalized epilepsy and epileptic syndromes, not intractable, without status epilepticus: Secondary | ICD-10-CM | POA: Diagnosis not present

## 2015-11-24 DIAGNOSIS — G4486 Cervicogenic headache: Secondary | ICD-10-CM

## 2015-11-24 MED ORDER — LEVETIRACETAM 500 MG PO TABS: 1000.0000 mg | ORAL_TABLET | Freq: Two times a day (BID) | ORAL | Status: DC

## 2015-11-24 NOTE — Progress Notes (Signed)
[facility]  PATIENT: Glenda Francis DOB: 02-09-1951   REASON FOR VISIT: Follow-up for seizure disorder, cervicogenic headache HISTORY FROM: Patient    HISTORY OF PRESENT ILLNESS:Glenda Francis is a 64 year old right-handed Hispanic female with a history of seizures associated with a panic attack like event followed by staring episodes or generalized seizure. The patient has done quite well on Keppra and Depakote combination, she has not had any further seizures since last seen. The patient is now back to driving a motor vehicle without difficulty. However, since June 2016 she has developed some headache discomfort on the left side. She initially was seen by her ophthalmologist, she was felt to have cataracts, but no etiology of her headache pain was noted. The patient reports a chronic history of left neck and shoulder discomfort that has been present for greater than one year. The patient indicates that the headaches will come up from the back of the head and go around the left eye associated with sharp pains. The patient notes a parallel between the neck and shoulder discomfort and the severity of the headache. She will also have some discomfort down into the arm, and she occasionally will have some dizziness. The patient denies any falls. She denies issues controlling the bowels or the bladder. She feels as if the neck is stiff. She comes to this office for further evaluation. She denies any pre-existing history of headache before June 2016. MRI of the brain 09/03/2015 without acute findings and no change from MRI from 02/19/2014. She was started on nortriptyline by Dr. Jannifer Franklin however she says the medication made her too drowsy and she feels much better at this time in regards to her headache. She does not feel she needs additional medication she returns for reevaluation. She has had recent cataract surgery both eyes    REVIEW OF SYSTEMS: Full 14 system review of systems performed  and notable only for those listed, all others are neg:  Constitutional: neg  Cardiovascular: neg Ear/Nose/Throat: neg  Skin: neg Eyes: Blurred vision Respiratory: neg Gastroitestinal: neg  Hematology/Lymphatic: neg  Endocrine: neg Musculoskeletal:neg Allergy/Immunology: neg Neurological: neg Psychiatric: Anxiety Sleep : neg   ALLERGIES: No Known Allergies  HOME MEDICATIONS: Outpatient Prescriptions Prior to Visit  Medication Sig Dispense Refill  . Calcium Carbonate-Vitamin D (CALCIUM + D PO) Take 1 capsule by mouth daily.    . Cyanocobalamin (VITAMIN B-12 CR PO) Take 1 capsule by mouth daily.    . divalproex (DEPAKOTE) 500 MG DR tablet Take 1 tablet (500 mg total) by mouth 2 (two) times daily. 180 tablet 1  . levETIRAcetam (KEPPRA) 500 MG tablet Take 2 tablets (1,000 mg total) by mouth every 12 (twelve) hours. 120 tablet 0  . losartan (COZAAR) 100 MG tablet TAKE 1 TABLET BY MOUTH EVERY DAY 30 tablet 5  . Multiple Vitamins-Minerals (MULTIVITAMIN PO) Take 1 capsule by mouth daily.    . nortriptyline (PAMELOR) 10 MG capsule Take one capsule at night for one week, then take 2 capsules at night 60 capsule 3  . Polyvinyl Alcohol-Povidone (REFRESH OP) Apply 1 drop to eye 2 (two) times daily.    Marland Kitchen Propylene Glycol (SYSTANE BALANCE) 0.6 % SOLN Apply 1 drop to eye 2 (two) times daily.    . Simethicone (GAS-X PO) Take 1 capsule by mouth as needed.    Marland Kitchen losartan (COZAAR) 100 MG tablet TAKE 1 TABLET BY MOUTH EVERY DAY (Patient not taking: Reported on 11/24/2015) 30 tablet 5   No facility-administered medications prior  to visit.    PAST MEDICAL HISTORY: Past Medical History  Diagnosis Date  . Asthma     childhood  . Chicken pox   . Depression   . Hypertension   . Seizures ([place])     STARTED 21/2 YEARS AGO  . Cervicogenic headache 08/20/2015    Left side    PAST SURGICAL HISTORY: Past Surgical History  Procedure Laterality Date  . Abdominal hysterectomy  1989    partial,  menorrhagia  . Cataract extraction Bilateral 10/26/2015, 12-04/2015    laser    FAMILY HISTORY: Family History  Problem Relation Age of Onset  . Cancer Mother     uterine,ovarian  . Asthma Other   . Hyperlipidemia Other   . Cancer Brother     colon-[address] 54, pancreatic  . Colon cancer Brother   . Cancer Brother     colon,stomach,brain  . Colon cancer Brother     SOCIAL HISTORY: Social History   Social History  . Marital Status: Divorced    Spouse Name: N/A  . Number of Children: 3  . Years of Education: college   Occupational History  . retired    Social History Main Topics  . Smoking status: Former Research scientist (life sciences)  . Smokeless tobacco: Never Used  . Alcohol Use: No     Comment: 1 GLASS WINE WITH DINNER  . Drug Use: No  . Sexual Activity: Not Currently   Other Topics Concern  . Not on file   Social History Narrative   Patient is divorced   Patient  has 3 children.   Patient is retired   Patient has some college.   Patient is currently doing yoga and water aerobics.   Patient drinks 1-2 cups of caffeine daily.   Patient is right handed.      PHYSICAL EXAM  Filed Vitals:   11/24/15 0939  BP: 122/71  Pulse: 76  Height: 5' (1.524 m)  Weight: 140 lb 3.2 oz (63.594 kg)   Body mass index is 27.38 kg/(m^2). General: The patient is alert and cooperative at the time of the examination. Neck: The neck is supple, no carotid bruits are noted. Respiratory: The respiratory examination is clear. Cardiovascular: The cardiovascular examination reveals a regular rate and rhythm, no obvious murmurs or rubs are noted. Neuromuscular: Range of movement of the cervical spine appears to be full. Skin: Extremities are without significant edema.  Neurologic Exam Mental status: The patient is alert and oriented x 3 at the time of the examination. The patient has apparent normal recent and remote memory, with an apparently normal attention span and concentration  ability. Cranial nerves: Facial symmetry is present.  The strength of the facial muscles and the muscles to head turning and shoulder shrug are normal bilaterally. Speech is well enunciated, no aphasia or dysarthria is noted. Pupils are equal, round, and reactive to light. Discs are flat bilaterally.Extraocular movements are full. Visual fields are full. The tongue is midline, and the patient has symmetric elevation of the soft palate. No obvious hearing deficits are noted. Motor: The motor testing reveals 5 over 5 strength of all 4 extremities. Good symmetric motor tone is noted throughout. Sensory: Sensory testing is notable for some decrease in pinprick sensation on the left leg relative to the right, symmetric on the arms. The patient has decreased vibration sensation on the left arm and leg, position sense is intact on all fours. No evidence of extinction is noted. Coordination: Cerebellar testing reveals good finger-nose-finger and heel-to-shin  bilaterally. Gait and station: Gait is normal. Tandem gait is normal. Romberg is negative. No drift is seen. Reflexes: Deep tendon reflexes are symmetric and normal bilaterally. Toes are downgoing bilaterally  DIAGNOSTIC DATA (LABS, IMAGING, TESTING) - I reviewed patient records, labs, notes, testing and imaging myself where available.  Lab Results  Component Value Date   WBC 5.6 08/20/2015   HGB 13.6 12/09/2014   HCT 40.1 08/20/2015   MCV 90.8 12/09/2014   PLT 175.0 12/09/2014      Component Value Date/Time   NA 140 08/20/2015 1020   NA 138 08/21/2012 1102   K 4.5 08/20/2015 1020   CL 96* 08/20/2015 1020   CO2 28 08/20/2015 1020   GLUCOSE 133* 08/20/2015 1020   GLUCOSE 103* 08/21/2012 1102   BUN 16 08/20/2015 1020   BUN 16 08/21/2012 1102   CREATININE 0.70 08/20/2015 1020   CALCIUM 9.5 08/20/2015 1020   PROT 6.9 08/20/2015 1020   PROT 7.0 08/21/2012 1102   ALBUMIN 4.1 08/20/2015 1020   ALBUMIN 3.9 08/21/2012 1102   AST 18 08/20/2015  1020   ALT 13 08/20/2015 1020   ALKPHOS 57 08/20/2015 1020   BILITOT 0.4 08/20/2015 1020   BILITOT 0.3 09/17/2014 1414   GFRNONAA 93 08/20/2015 1020   GFRAA 107 08/20/2015 1020     ASSESSMENT AND PLAN  64 y.o. year old female  has a past medical history of  Seizures ([place]); and Cervicogenic headache (08/20/2015). heto follow-up patient is a current patient of Dr. Jannifer Franklin  who is out of the office today . This note is sent to the work in doctor.     Continue Keppra at current dose will refill Continue Depakote at current dose  Reviewed labs from September Reviewed MRI from September copy to patient Follow-up in 6 months Dennie Bible, [facility], [facility], [facility] [address] [place], [place] [address] [phone]

## 2015-11-24 NOTE — Patient Instructions (Addendum)
Continue Keppra at current dose will refill Continue Depakote at current dose  Reviewed labs from September Reviewed MRI from September copy to patient Follow-up in 6 months

## 2015-11-26 NOTE — Progress Notes (Signed)
I reviewed note and agree with plan.   Penni Bombard, MD [id number], [id number] PM Certified in Neurology, Neurophysiology and Neuroimaging  [facility] [address] [place], [place] [address] [phone]

## 2015-12-23 ENCOUNTER — Ambulatory Visit (INDEPENDENT_AMBULATORY_CARE_PROVIDER_SITE_OTHER): Payer: BLUE CROSS/BLUE SHIELD | Admitting: Family Medicine

## 2015-12-23 ENCOUNTER — Encounter: Payer: Self-pay | Admitting: Family Medicine

## 2015-12-23 ENCOUNTER — Other Ambulatory Visit: Payer: Self-pay | Admitting: Family Medicine

## 2015-12-23 DIAGNOSIS — M545 Low back pain, unspecified: Secondary | ICD-10-CM

## 2015-12-23 LAB — POCT URINALYSIS DIPSTICK
Bilirubin, UA: NEGATIVE
Glucose, UA: NEGATIVE
Nitrite, UA: NEGATIVE
PH UA: 7
PROTEIN UA: NEGATIVE
SPEC GRAV UA: 1.01
UROBILINOGEN UA: 0.2

## 2015-12-23 MED ORDER — CIPROFLOXACIN HCL 500 MG PO TABS: 500.0000 mg | ORAL_TABLET | Freq: Two times a day (BID) | ORAL | Status: DC

## 2015-12-23 NOTE — Patient Instructions (Signed)

## 2015-12-23 NOTE — Progress Notes (Signed)
   Subjective:    Patient ID: Glenda Francis, female    DOB: 10/04/1951, 65 y.o.   MRN: [id number]  HPI Patient seen with about 4 to five-day history of some vague poorly localized lower lumbar back pain but mostly urine frequency and burning with urination. She's had some slight nausea but no vomiting. No fevers or chills. She also has had some very mild left lower quadrant pain. She had colonoscopy couple years ago that did show diffuse diverticulosis throughout the colon that she does not have any known history of diverticulitis. She had prior hysterectomy but has reportedly both ovaries.  Past Medical History  Diagnosis Date  . Asthma     childhood  . Chicken pox   . Depression   . Hypertension   . Seizures ([place])     STARTED 21/2 YEARS AGO  . Cervicogenic headache 08/20/2015    Left side   Past Surgical History  Procedure Laterality Date  . Abdominal hysterectomy  1989    partial, menorrhagia  . Cataract extraction Bilateral 10/26/2015, 12-04/2015    laser    reports that she has quit smoking. She has never used smokeless tobacco. She reports that she does not drink alcohol or use illicit drugs. family history includes Asthma in her other; Cancer in her brother, brother, and mother; Colon cancer in her brother and brother; Hyperlipidemia in her other. No Known Allergies    Review of Systems  Constitutional: Negative for fever, chills and appetite change.  Gastrointestinal: Negative for nausea, vomiting, abdominal pain, diarrhea and constipation.  Genitourinary: Positive for dysuria and frequency. Negative for hematuria.  Musculoskeletal: Positive for back pain.  Neurological: Negative for dizziness.       Objective:   Physical Exam  Constitutional: She appears well-developed and well-nourished.  Cardiovascular: Normal rate and regular rhythm.   Pulmonary/Chest: Effort normal and breath sounds normal. No respiratory distress. She has no wheezes. She has no rales.    Abdominal: Soft. Bowel sounds are normal. She exhibits no distension and no mass. There is no rebound and no guarding.  Minimal tenderness left lower quadrant to deep palpation. No guarding or rebound.  Musculoskeletal: She exhibits no edema.          Assessment & Plan:  Dysuria. Suspect uncomplicated cystitis. Urine culture sent. Cipro 500 mg twice a day for 7 days. She did have some mild tenderness left lower quadrant. Cannot rule out mild early diverticulitis. Consider addition of Flagyl if she has any persistent symptoms or not improving over the next couple days promptly.

## 2015-12-26 LAB — URINE CULTURE: Colony Count: 15000

## 2015-12-28 ENCOUNTER — Other Ambulatory Visit: Payer: Self-pay | Admitting: Family Medicine

## 2015-12-31 ENCOUNTER — Telehealth: Payer: Self-pay | Admitting: Nurse Practitioner

## 2015-12-31 MED ORDER — LEVETIRACETAM 500 MG PO TABS: 1000.0000 mg | ORAL_TABLET | Freq: Two times a day (BID) | ORAL | Status: DC

## 2015-12-31 NOTE — Telephone Encounter (Signed)
Pt called said pharmacy refilled levETIRAcetam (KEPPRA) 500 MG tablet for 1 mth in December instead of 3 mth supply and she was told today that she had to call [facility] to get refill. I advised there were 6 refills. She is requesting [facility] to call pharmacy.

## 2015-12-31 NOTE — Telephone Encounter (Signed)
The pharmacy will not change a 30 day Rx to 90 day Rx.  They asked that a new Rx be sent.  This has been done.  They will notify the patient when it's ready for pick up.

## 2016-03-25 ENCOUNTER — Other Ambulatory Visit: Payer: Self-pay | Admitting: Family Medicine

## 2016-04-13 ENCOUNTER — Encounter (INDEPENDENT_AMBULATORY_CARE_PROVIDER_SITE_OTHER): Payer: BLUE CROSS/BLUE SHIELD | Admitting: Family Medicine

## 2016-04-13 NOTE — Progress Notes (Signed)
Pre visit review using our clinic review tool, if applicable. No additional management support is needed unless otherwise documented below in the visit note. 

## 2016-04-15 ENCOUNTER — Ambulatory Visit (INDEPENDENT_AMBULATORY_CARE_PROVIDER_SITE_OTHER): Payer: BLUE CROSS/BLUE SHIELD | Admitting: Family Medicine

## 2016-04-15 VITALS — BP 144/90 | HR 87 | Temp 98.2°F | Ht 60.0 in | Wt 141.8 lb

## 2016-04-15 DIAGNOSIS — M545 Low back pain: Secondary | ICD-10-CM | POA: Diagnosis not present

## 2016-04-15 LAB — POCT URINALYSIS DIPSTICK
BILIRUBIN UA: NEGATIVE
GLUCOSE UA: NEGATIVE
Leukocytes, UA: NEGATIVE
Nitrite, UA: NEGATIVE
PH UA: 7.5
Protein, UA: NEGATIVE
Spec Grav, UA: 1.015
Urobilinogen, UA: 0.2

## 2016-04-15 NOTE — Progress Notes (Signed)
This encounter was created in error - please disregard.

## 2016-04-15 NOTE — Patient Instructions (Signed)
Low Back Sprain With Rehab A sprain is an injury in which a ligament is torn. The ligaments of the lower back are vulnerable to sprains. However, they are strong and require great force to be injured. These ligaments are important for stabilizing the spinal column. Sprains are classified into three categories. Grade 1 sprains cause pain, but the tendon is not lengthened. Grade 2 sprains include a lengthened ligament, due to the ligament being stretched or partially ruptured. With grade 2 sprains there is still function, although the function may be decreased. Grade 3 sprains involve a complete tear of the tendon or muscle, and function is usually impaired. SYMPTOMS   Severe pain in the lower back.  Sometimes, a feeling of a "pop," "snap," or tear, at the time of injury.  Tenderness and sometimes swelling at the injury site.  Uncommonly, bruising (contusion) within 48 hours of injury.  Muscle spasms in the back. CAUSES  Low back sprains occur when a force is placed on the ligaments that is greater than they can handle. Common causes of injury include:  Performing a stressful act while off-balance.  Repetitive stressful activities that involve movement of the lower back.  Direct hit (trauma) to the lower back. RISK INCREASES WITH:  Contact sports (football, wrestling).  Collisions (major skiing accidents).  Sports that require throwing or lifting (baseball, weightlifting).  Sports involving twisting of the spine (gymnastics, diving, tennis, golf).  Poor strength and flexibility.  Inadequate protection.  Previous back injury or surgery (especially fusion). PREVENTION  Wear properly fitted and padded protective equipment.  Warm up and stretch properly before activity.  Allow for adequate recovery between workouts.  Maintain physical fitness:  Strength, flexibility, and endurance.  Cardiovascular fitness.  Maintain a healthy body weight. PROGNOSIS  If treated properly,  low back sprains usually heal with non-surgical treatment. The length of time for healing depends on the severity of the injury.  RELATED COMPLICATIONS   Recurring symptoms, resulting in a chronic problem.  Chronic inflammation and pain in the low back.  Delayed healing or resolution of symptoms, especially if activity is resumed too soon.  Prolonged impairment.  Unstable or arthritic joints of the low back. TREATMENT  Treatment first involves the use of ice and medicine, to reduce pain and inflammation. The use of strengthening and stretching exercises may help reduce pain with activity. These exercises may be performed at home or with a therapist. Severe injuries may require referral to a therapist for further evaluation and treatment, such as ultrasound. Your caregiver may advise that you wear a back brace or corset, to help reduce pain and discomfort. Often, prolonged bed rest results in greater harm then benefit. Corticosteroid injections may be recommended. However, these should be reserved for the most serious cases. It is important to avoid using your back when lifting objects. At night, sleep on your back on a firm mattress, with a pillow placed under your knees. If non-surgical treatment is unsuccessful, surgery may be needed.  MEDICATION   If pain medicine is needed, nonsteroidal anti-inflammatory medicines (aspirin and ibuprofen), or other minor pain relievers (acetaminophen), are often advised.  Do not take pain medicine for 7 days before surgery.  Prescription pain relievers may be given, if your caregiver thinks they are needed. Use only as directed and only as much as you need.  Ointments applied to the skin may be helpful.  Corticosteroid injections may be given by your caregiver. These injections should be reserved for the most serious cases, because   they may only be given a certain number of times. HEAT AND COLD  Cold treatment (icing) should be applied for 10 to 15  minutes every 2 to 3 hours for inflammation and pain, and immediately after activity that aggravates your symptoms. Use ice packs or an ice massage.  Heat treatment may be used before performing stretching and strengthening activities prescribed by your caregiver, physical therapist, or athletic trainer. Use a heat pack or a warm water soak. SEEK MEDICAL CARE IF:   Symptoms get worse or do not improve in 2 to 4 weeks, despite treatment.  You develop numbness or weakness in either leg.  You lose bowel or bladder function.  Any of the following occur after surgery: fever, increased pain, swelling, redness, drainage of fluids, or bleeding in the affected area.  New, unexplained symptoms develop. (Drugs used in treatment may produce side effects.) EXERCISES  RANGE OF MOTION (ROM) AND STRETCHING EXERCISES - Low Back Sprain Most people with lower back pain will find that their symptoms get worse with excessive bending forward (flexion) or arching at the lower back (extension). The exercises that will help resolve your symptoms will focus on the opposite motion.  Your physician, physical therapist or athletic trainer will help you determine which exercises will be most helpful to resolve your lower back pain. Do not complete any exercises without first consulting with your caregiver. Discontinue any exercises which make your symptoms worse, until you speak to your caregiver. If you have pain, numbness or tingling which travels down into your buttocks, leg or foot, the goal of the therapy is for these symptoms to move closer to your back and eventually resolve. Sometimes, these leg symptoms will get better, but your lower back pain may worsen. This is often an indication of progress in your rehabilitation. Be very alert to any changes in your symptoms and the activities in which you participated in the 24 hours prior to the change. Sharing this information with your caregiver will allow him or her to most  efficiently treat your condition. These exercises may help you when beginning to rehabilitate your injury. Your symptoms may resolve with or without further involvement from your physician, physical therapist or athletic trainer. While completing these exercises, remember:   Restoring tissue flexibility helps normal motion to return to the joints. This allows healthier, less painful movement and activity.  An effective stretch should be held for at least 30 seconds.  A stretch should never be painful. You should only feel a gentle lengthening or release in the stretched tissue. FLEXION RANGE OF MOTION AND STRETCHING EXERCISES: STRETCH - Flexion, Single Knee to Chest   Lie on a firm bed or floor with both legs extended in front of you.  Keeping one leg in contact with the floor, bring your opposite knee to your chest. Hold your leg in place by either grabbing behind your thigh or at your knee.  Pull until you feel a gentle stretch in your low back. Hold __________ seconds.  Slowly release your grasp and repeat the exercise with the opposite side. Repeat __________ times. Complete this exercise __________ times per day.  STRETCH - Flexion, Double Knee to Chest  Lie on a firm bed or floor with both legs extended in front of you.  Keeping one leg in contact with the floor, bring your opposite knee to your chest.  Tense your stomach muscles to support your back and then lift your other knee to your chest. Hold your legs in   place by either grabbing behind your thighs or at your knees.  Pull both knees toward your chest until you feel a gentle stretch in your low back. Hold __________ seconds.  Tense your stomach muscles and slowly return one leg at a time to the floor. Repeat __________ times. Complete this exercise __________ times per day.  STRETCH - Low Trunk Rotation  Lie on a firm bed or floor. Keeping your legs in front of you, bend your knees so they are both pointed toward the  ceiling and your feet are flat on the floor.  Extend your arms out to the side. This will stabilize your upper body by keeping your shoulders in contact with the floor.  Gently and slowly drop both knees together to one side until you feel a gentle stretch in your low back. Hold for __________ seconds.  Tense your stomach muscles to support your lower back as you bring your knees back to the starting position. Repeat the exercise to the other side. Repeat __________ times. Complete this exercise __________ times per day  EXTENSION RANGE OF MOTION AND FLEXIBILITY EXERCISES: STRETCH - Extension, Prone on Elbows   Lie on your stomach on the floor, a bed will be too soft. Place your palms about shoulder width apart and at the height of your head.  Place your elbows under your shoulders. If this is too painful, stack pillows under your chest.  Allow your body to relax so that your hips drop lower and make contact more completely with the floor.  Hold this position for __________ seconds.  Slowly return to lying flat on the floor. Repeat __________ times. Complete this exercise __________ times per day.  RANGE OF MOTION - Extension, Prone Press Ups  Lie on your stomach on the floor, a bed will be too soft. Place your palms about shoulder width apart and at the height of your head.  Keeping your back as relaxed as possible, slowly straighten your elbows while keeping your hips on the floor. You may adjust the placement of your hands to maximize your comfort. As you gain motion, your hands will come more underneath your shoulders.  Hold this position __________ seconds.  Slowly return to lying flat on the floor. Repeat __________ times. Complete this exercise __________ times per day.  RANGE OF MOTION- Quadruped, Neutral Spine   Assume a hands and knees position on a firm surface. Keep your hands under your shoulders and your knees under your hips. You may place padding under your knees for  comfort.  Drop your head and point your tailbone toward the ground below you. This will round out your lower back like an angry cat. Hold this position for __________ seconds.  Slowly lift your head and release your tail bone so that your back sags into a large arch, like an old horse.  Hold this position for __________ seconds.  Repeat this until you feel limber in your low back.  Now, find your "sweet spot." This will be the most comfortable position somewhere between the two previous positions. This is your neutral spine. Once you have found this position, tense your stomach muscles to support your low back.  Hold this position for __________ seconds. Repeat __________ times. Complete this exercise __________ times per day.  STRENGTHENING EXERCISES - Low Back Sprain These exercises may help you when beginning to rehabilitate your injury. These exercises should be done near your "sweet spot." This is the neutral, low-back arch, somewhere between fully rounded and   fully arched, that is your least painful position. When performed in this safe range of motion, these exercises can be used for people who have either a flexion or extension based injury. These exercises may resolve your symptoms with or without further involvement from your physician, physical therapist or athletic trainer. While completing these exercises, remember:   Muscles can gain both the endurance and the strength needed for everyday activities through controlled exercises.  Complete these exercises as instructed by your physician, physical therapist or athletic trainer. Increase the resistance and repetitions only as guided.  You may experience muscle soreness or fatigue, but the pain or discomfort you are trying to eliminate should never worsen during these exercises. If this pain does worsen, stop and make certain you are following the directions exactly. If the pain is still present after adjustments, discontinue the  exercise until you can discuss the trouble with your caregiver. STRENGTHENING - Deep Abdominals, Pelvic Tilt   Lie on a firm bed or floor. Keeping your legs in front of you, bend your knees so they are both pointed toward the ceiling and your feet are flat on the floor.  Tense your lower abdominal muscles to press your low back into the floor. This motion will rotate your pelvis so that your tail bone is scooping upwards rather than pointing at your feet or into the floor. With a gentle tension and even breathing, hold this position for __________ seconds. Repeat __________ times. Complete this exercise __________ times per day.  STRENGTHENING - Abdominals, Crunches   Lie on a firm bed or floor. Keeping your legs in front of you, bend your knees so they are both pointed toward the ceiling and your feet are flat on the floor. Cross your arms over your chest.  Slightly tip your chin down without bending your neck.  Tense your abdominals and slowly lift your trunk high enough to just clear your shoulder blades. Lifting higher can put excessive stress on the lower back and does not further strengthen your abdominal muscles.  Control your return to the starting position. Repeat __________ times. Complete this exercise __________ times per day.  STRENGTHENING - Quadruped, Opposite UE/LE Lift   Assume a hands and knees position on a firm surface. Keep your hands under your shoulders and your knees under your hips. You may place padding under your knees for comfort.  Find your neutral spine and gently tense your abdominal muscles so that you can maintain this position. Your shoulders and hips should form a rectangle that is parallel with the floor and is not twisted.  Keeping your trunk steady, lift your right hand no higher than your shoulder and then your left leg no higher than your hip. Make sure you are not holding your breath. Hold this position for __________ seconds.  Continuing to keep  your abdominal muscles tense and your back steady, slowly return to your starting position. Repeat with the opposite arm and leg. Repeat __________ times. Complete this exercise __________ times per day.  STRENGTHENING - Abdominals and Quadriceps, Straight Leg Raise   Lie on a firm bed or floor with both legs extended in front of you.  Keeping one leg in contact with the floor, bend the other knee so that your foot can rest flat on the floor.  Find your neutral spine, and tense your abdominal muscles to maintain your spinal position throughout the exercise.  Slowly lift your straight leg off the floor about 6 inches for a count of   15, making sure to not hold your breath.  Still keeping your neutral spine, slowly lower your leg all the way to the floor. Repeat this exercise with each leg __________ times. Complete this exercise __________ times per day. POSTURE AND BODY MECHANICS CONSIDERATIONS - Low Back Sprain Keeping correct posture when sitting, standing or completing your activities will reduce the stress put on different body tissues, allowing injured tissues a chance to heal and limiting painful experiences. The following are general guidelines for improved posture. Your physician or physical therapist will provide you with any instructions specific to your needs. While reading these guidelines, remember:  The exercises prescribed by your provider will help you have the flexibility and strength to maintain correct postures.  The correct posture provides the best environment for your joints to work. All of your joints have less wear and tear when properly supported by a spine with good posture. This means you will experience a healthier, less painful body.  Correct posture must be practiced with all of your activities, especially prolonged sitting and standing. Correct posture is as important when doing repetitive low-stress activities (typing) as it is when doing a single heavy-load  activity (lifting). RESTING POSITIONS Consider which positions are most painful for you when choosing a resting position. If you have pain with flexion-based activities (sitting, bending, stooping, squatting), choose a position that allows you to rest in a less flexed posture. You would want to avoid curling into a fetal position on your side. If your pain worsens with extension-based activities (prolonged standing, working overhead), avoid resting in an extended position such as sleeping on your stomach. Most people will find more comfort when they rest with their spine in a more neutral position, neither too rounded nor too arched. Lying on a non-sagging bed on your side with a pillow between your knees, or on your back with a pillow under your knees will often provide some relief. Keep in mind, being in any one position for a prolonged period of time, no matter how correct your posture, can still lead to stiffness. PROPER SITTING POSTURE In order to minimize stress and discomfort on your spine, you must sit with correct posture. Sitting with good posture should be effortless for a healthy body. Returning to good posture is a gradual process. Many people can work toward this most comfortably by using various supports until they have the flexibility and strength to maintain this posture on their own. When sitting with proper posture, your ears will fall over your shoulders and your shoulders will fall over your hips. You should use the back of the chair to support your upper back. Your lower back will be in a neutral position, just slightly arched. You may place a small pillow or folded towel at the base of your lower back for  support.  When working at a desk, create an environment that supports good, upright posture. Without extra support, muscles tire, which leads to excessive strain on joints and other tissues. Keep these recommendations in mind: CHAIR:  A chair should be able to slide under your desk  when your back makes contact with the back of the chair. This allows you to work closely.  The chair's height should allow your eyes to be level with the upper part of your monitor and your hands to be slightly lower than your elbows. BODY POSITION  Your feet should make contact with the floor. If this is not possible, use a foot rest.  Keep your ears   over your shoulders. This will reduce stress on your neck and low back. INCORRECT SITTING POSTURES  If you are feeling tired and unable to assume a healthy sitting posture, do not slouch or slump. This puts excessive strain on your back tissues, causing more damage and pain. Healthier options include:  Using more support, like a lumbar pillow.  Switching tasks to something that requires you to be upright or walking.  Talking a brief walk.  Lying down to rest in a neutral-spine position. PROLONGED STANDING WHILE SLIGHTLY LEANING FORWARD  When completing a task that requires you to lean forward while standing in one place for a long time, place either foot up on a stationary 2-4 inch high object to help maintain the best posture. When both feet are on the ground, the lower back tends to lose its slight inward curve. If this curve flattens (or becomes too large), then the back and your other joints will experience too much stress, tire more quickly, and can cause pain. CORRECT STANDING POSTURES Proper standing posture should be assumed with all daily activities, even if they only take a few moments, like when brushing your teeth. As in sitting, your ears should fall over your shoulders and your shoulders should fall over your hips. You should keep a slight tension in your abdominal muscles to brace your spine. Your tailbone should point down to the ground, not behind your body, resulting in an over-extended swayback posture.  INCORRECT STANDING POSTURES  Common incorrect standing postures include a forward head, locked knees and/or an excessive  swayback. WALKING Walk with an upright posture. Your ears, shoulders and hips should all line-up. PROLONGED ACTIVITY IN A FLEXED POSITION When completing a task that requires you to bend forward at your waist or lean over a low surface, try to find a way to stabilize 3 out of 4 of your limbs. You can place a hand or elbow on your thigh or rest a knee on the surface you are reaching across. This will provide you more stability, so that your muscles do not tire as quickly. By keeping your knees relaxed, or slightly bent, you will also reduce stress across your lower back. CORRECT LIFTING TECHNIQUES DO :  Assume a wide stance. This will provide you more stability and the opportunity to get as close as possible to the object which you are lifting.  Tense your abdominals to brace your spine. Bend at the knees and hips. Keeping your back locked in a neutral-spine position, lift using your leg muscles. Lift with your legs, keeping your back straight.  Test the weight of unknown objects before attempting to lift them.  Try to keep your elbows locked down at your sides in order get the best strength from your shoulders when carrying an object.  Always ask for help when lifting heavy or awkward objects. INCORRECT LIFTING TECHNIQUES DO NOT:   Lock your knees when lifting, even if it is a small object.  Bend and twist. Pivot at your feet or move your feet when needing to change directions.  Assume that you can safely pick up even a paperclip without proper posture.   This information is not intended to replace advice given to you by your health care provider. Make sure you discuss any questions you have with your health care provider.   Document Released: 11/14/2005 Document Revised: 12/05/2014 Document Reviewed: 02/26/2009 [employer] Patient Education 2016 [employer]  

## 2016-04-15 NOTE — Progress Notes (Signed)
   Subjective:    Patient ID: Glenda Francis, female    DOB: 04/11/1951, 65 y.o.   MRN: [id number]  HPI  Patient seen with one-week history of bilateral lower lumbar back pain. She has also noted some bilateral lower pelvic pain and has scheduled follow-up with her gynecologist next week. She states she is concerned in general because of strong family history of cancer. She has a nephew with colon cancer at age 36 and sister was diagnosed with ovarian cancer. Patient's colonoscopy is up-to-date.  No known injury. Pain is dull and 5 out of 10 severity at its worse. She's had occasional mild burning with urination and was concerned she may have UTI. No fevers or chills. No true flank pain. Pain is more lower lumbar. Occasional radiation left lower extremities inconsistent  Denies any recent appetite or weight changes. No fevers or chills  Past Medical History  Diagnosis Date  . Asthma     childhood  . Chicken pox   . Depression   . Hypertension   . Seizures ([place])     STARTED 21/2 YEARS AGO  . Cervicogenic headache 08/20/2015    Left side   Past Surgical History  Procedure Laterality Date  . Abdominal hysterectomy  1989    partial, menorrhagia  . Cataract extraction Bilateral 10/26/2015, 12-04/2015    laser    reports that she has quit smoking. She has never used smokeless tobacco. She reports that she does not drink alcohol or use illicit drugs. family history includes Asthma in her other; Cancer in her brother, brother, and mother; Colon cancer in her brother and brother; Hyperlipidemia in her other. No Known Allergies   Review of Systems  Constitutional: Negative for fever, chills, appetite change and unexpected weight change.  Respiratory: Negative for shortness of breath.   Cardiovascular: Negative for chest pain.  Gastrointestinal: Negative for nausea, vomiting, abdominal pain, diarrhea and constipation.  Genitourinary: Positive for dysuria. Negative for hematuria.    Musculoskeletal: Positive for back pain.       Objective:   Physical Exam  Constitutional: She appears well-developed and well-nourished.  Cardiovascular: Normal rate and regular rhythm.   Pulmonary/Chest: Effort normal and breath sounds normal. No respiratory distress. She has no wheezes. She has no rales.  Abdominal: Soft. Bowel sounds are normal. She exhibits no distension and no mass. There is no tenderness. There is no rebound and no guarding.  Musculoskeletal:  Straight leg raises are negative bilaterally  Neurological:  Full-strength lower extremities. Symmetric reflexes          Assessment & Plan:  Bilateral low back pain. Mild dysuria. Urine dipstick negative for leukocytes or nitrites. Does not appear to be infectious. Nonfocal exam. Handout given for some stretches to try. She will try over-the-counter ibuprofen. Consider physical therapy if not improving over the next couple weeks. She has follow-up with her GYN scheduled regarding her mild bilateral lower pelvic pain.  Eulas Post MD [facility] at [facility]

## 2016-04-15 NOTE — Progress Notes (Signed)
Pre visit review using our clinic review tool, if applicable. No additional management support is needed unless otherwise documented below in the visit note. 

## 2016-04-26 LAB — HM DEXA SCAN

## 2016-04-27 ENCOUNTER — Encounter: Payer: Self-pay | Admitting: Nurse Practitioner

## 2016-05-03 ENCOUNTER — Other Ambulatory Visit: Payer: Self-pay | Admitting: Obstetrics & Gynecology

## 2016-05-03 DIAGNOSIS — R928 Other abnormal and inconclusive findings on diagnostic imaging of breast: Secondary | ICD-10-CM

## 2016-05-09 ENCOUNTER — Other Ambulatory Visit: Payer: Self-pay | Admitting: Family Medicine

## 2016-05-09 ENCOUNTER — Ambulatory Visit
Admission: RE | Admit: 2016-05-09 | Discharge: 2016-05-09 | Disposition: A | Discharge status: Home / Self Care | Payer: BLUE CROSS/BLUE SHIELD | Source: Ambulatory Visit | Attending: Obstetrics & Gynecology | Admitting: Obstetrics & Gynecology

## 2016-05-09 DIAGNOSIS — R928 Other abnormal and inconclusive findings on diagnostic imaging of breast: Secondary | ICD-10-CM

## 2016-05-21 ENCOUNTER — Other Ambulatory Visit: Payer: Self-pay | Admitting: Neurology

## 2016-05-25 ENCOUNTER — Ambulatory Visit: Payer: BLUE CROSS/BLUE SHIELD | Admitting: Nurse Practitioner

## 2016-05-30 ENCOUNTER — Ambulatory Visit: Payer: BLUE CROSS/BLUE SHIELD | Admitting: Nurse Practitioner

## 2016-06-01 ENCOUNTER — Encounter: Payer: Self-pay | Admitting: Nurse Practitioner

## 2016-06-01 ENCOUNTER — Ambulatory Visit (INDEPENDENT_AMBULATORY_CARE_PROVIDER_SITE_OTHER): Payer: BLUE CROSS/BLUE SHIELD | Admitting: Nurse Practitioner

## 2016-06-01 VITALS — BP 133/72 | HR 62 | Ht 60.0 in | Wt 140.0 lb

## 2016-06-01 DIAGNOSIS — G40409 Other generalized epilepsy and epileptic syndromes, not intractable, without status epilepticus: Secondary | ICD-10-CM | POA: Diagnosis not present

## 2016-06-01 DIAGNOSIS — R51 Headache: Secondary | ICD-10-CM

## 2016-06-01 DIAGNOSIS — G4486 Cervicogenic headache: Secondary | ICD-10-CM

## 2016-06-01 MED ORDER — LEVETIRACETAM 500 MG PO TABS: 1000.0000 mg | ORAL_TABLET | Freq: Two times a day (BID) | ORAL | Status: DC

## 2016-06-01 MED ORDER — DIVALPROEX SODIUM 500 MG PO DR TAB: DELAYED_RELEASE_TABLET | ORAL | Status: DC

## 2016-06-01 NOTE — Patient Instructions (Addendum)
Continue Keppra at current dose will refill Continue Depakote at current dose  Will sign to get recent labs Follow up in 8 months

## 2016-06-01 NOTE — Progress Notes (Signed)
[facility]  PATIENT: Glenda Francis DOB: 1951-05-25   REASON FOR VISIT: Follow-up for history of generalized seizure, headache HISTORY FROM: Patient    HISTORY OF PRESENT ILLNESS:Glenda Francis is a 65 year old right-handed Hispanic female with a history of seizures associated with a panic attack like event followed by staring episodes or generalized seizure. The patient has done quite well on Keppra and Depakote combination, she has not had any further seizures since Depakote added in 2012.  The patient is now back to driving a motor vehicle without difficulty. The patient reports a chronic history of left neck and shoulder discomfort that has been present for greater than two year. The patient indicates that the headaches will come up from the back of the head and go around the left eye associated with sharp pains. The patient notes a parallel between the neck and shoulder discomfort and the severity of the headache. She will also have some discomfort down into the arm, and she occasionally will have some dizziness. The patient denies any falls. She denies issues controlling the bowels or the bladder. She feels as if the neck is stiff. She comes to this office for further evaluation. Her neck pain is stable at this time. She denies any pre-existing history of headache before June 2016. MRI of the brain 09/03/2015 without acute findings and no change from MRI from 02/19/2014.  She does not feel she needs additional medication for neck pain /headache. Her nephew is dying of colon cancer.  She returns for reevaluation.   REVIEW OF SYSTEMS: Full 14 system review of systems performed and notable only for those listed, all others are neg:  Constitutional: neg  Cardiovascular: neg Ear/Nose/Throat: neg  Skin: neg Eyes: neg Respiratory: neg Gastroitestinal: neg  Hematology/Lymphatic: neg  Endocrine: neg Musculoskeletal: Neck stiffness Allergy/Immunology: neg Neurological:  neg Psychiatric: neg Sleep : neg   ALLERGIES: No Known Allergies  HOME MEDICATIONS: Outpatient Prescriptions Prior to Visit  Medication Sig Dispense Refill  . Calcium Carbonate-Vitamin D (CALCIUM + D PO) Take 1 capsule by mouth daily.    . Cyanocobalamin (VITAMIN B-12 CR PO) Take 1 capsule by mouth daily.    . divalproex (DEPAKOTE) 500 MG DR tablet TAKE 1 TABLET (500 MG TOTAL) BY MOUTH 2 (TWO) TIMES DAILY. 180 tablet 1  . DUREZOL 0.05 % EMUL 1 DROP IN RIGHT EYE THREE TIMES A DAY *PA* prn  1  . levETIRAcetam (KEPPRA) 500 MG tablet Take 2 tablets (1,000 mg total) by mouth every 12 (twelve) hours. 360 tablet 1  . losartan (COZAAR) 100 MG tablet TAKE 1 TABLET BY MOUTH EVERY DAY 30 tablet 5  . Multiple Vitamins-Minerals (MULTIVITAMIN PO) Take 1 capsule by mouth daily.    . Polyvinyl Alcohol-Povidone (REFRESH OP) Apply 1 drop to eye 2 (two) times daily as needed.     Marland Kitchen Propylene Glycol (SYSTANE BALANCE) 0.6 % SOLN Apply 1 drop to eye 2 (two) times daily as needed.     . Simethicone (GAS-X PO) Take 1 capsule by mouth as needed.     No facility-administered medications prior to visit.    PAST MEDICAL HISTORY: Past Medical History  Diagnosis Date  . Asthma     childhood  . Chicken pox   . Depression   . Hypertension   . Seizures ([place])     STARTED 21/2 YEARS AGO  . Cervicogenic headache 08/20/2015    Left side    PAST SURGICAL HISTORY: Past Surgical History  Procedure Laterality Date  .  Abdominal hysterectomy  1989    partial, menorrhagia  . Cataract extraction Bilateral 10/26/2015, 12-04/2015    laser    FAMILY HISTORY: Family History  Problem Relation Age of Onset  . Cancer Mother     uterine,ovarian  . Asthma Other   . Hyperlipidemia Other   . Cancer Brother     colon-PASSEDA [address] 54, pancreatic  . Colon cancer Brother   . Cancer Brother     colon,stomach,brain  . Colon cancer Brother   . Colon cancer Other     SOCIAL HISTORY: Social History   Social History   . Marital Status: Divorced    Spouse Name: N/A  . Number of Children: 3  . Years of Education: college   Occupational History  . retired    Social History Main Topics  . Smoking status: Former Research scientist (life sciences)  . Smokeless tobacco: Never Used  . Alcohol Use: No     Comment: 1 GLASS WINE WITH DINNER  . Drug Use: No  . Sexual Activity: Not Currently   Other Topics Concern  . Not on file   Social History Narrative   Patient is divorced   Patient  has 3 children.   Patient is retired   Patient has some college.   Patient is currently doing yoga and water aerobics.   Patient drinks 1-2 cups of caffeine daily.   Patient is right handed.      PHYSICAL EXAM  Filed Vitals:   06/01/16 1411  BP: 133/72  Pulse: 62  Height: 5' (1.524 m)  Weight: 140 lb (63.504 kg)   Body mass index is 27.34 kg/(m^2). General: The patient is alert and cooperative at the time of the examination. Neck: The neck is supple, no carotid bruits are noted. Cardiovascular: The cardiovascular examination reveals a regular rate and rhythm, no obvious murmurs or rubs are noted. Neuromuscular: Range of movement of the cervical spine appears to be full. Skin: Extremities are without significant edema.  Neurologic Exam Mental status: The patient is alert and oriented x 3 at the time of the examination. The patient has apparent normal recent and remote memory, with an apparently normal attention span and concentration ability. Cranial nerves: Facial symmetry is present. The strength of the facial muscles and the muscles to head turning and shoulder shrug are normal bilaterally. Speech is well enunciated, no aphasia or dysarthria is noted. Pupils are equal, round, and reactive to light. Discs are flat bilaterally.Extraocular movements are full. Visual fields are full. The tongue is midline, and the patient has symmetric elevation of the soft palate. No obvious hearing deficits are noted. Motor: The motor testing reveals 5  over 5 strength of all 4 extremities. Good symmetric motor tone is noted throughout. Sensory: Sensory testing is notable for some decrease in pinprick sensation on the left leg relative to the right, symmetric on the arms. The patient has decreased vibration sensation on the left arm and leg, position sense is intact on all fours. No evidence of extinction is noted. Coordination: Cerebellar testing reveals good finger-nose-finger and heel-to-shin bilaterally. Gait and station: Gait is normal. Tandem gait is normal. Romberg is negative. No drift is seen. Reflexes: Deep tendon reflexes are symmetric and normal bilaterally. Toes are downgoing bilaterally DIAGNOSTIC DATA (LABS, IMAGING, TESTING) -    ASSESSMENT AND PLAN 65 y.o. year old female has a past medical history of Seizures ([place]); and Cervicogenic headache (08/20/2015). here to  follow-up. Last seizure was in 2012. Headaches are in good control.  Continue Keppra at current dose will refill Continue Depakote at current dose  Will sign to get recent labs Follow-up in 8 months Dennie Bible, [facility], [facility], [facility] [address] [place], [place] [address] [phone]

## 2016-06-01 NOTE — Progress Notes (Signed)
I have read the note, and I agree with the clinical assessment and plan.  Glenda Francis,Glenda Francis   

## 2016-09-16 ENCOUNTER — Other Ambulatory Visit: Payer: Self-pay | Admitting: Family Medicine

## 2016-10-03 ENCOUNTER — Ambulatory Visit (INDEPENDENT_AMBULATORY_CARE_PROVIDER_SITE_OTHER): Payer: BLUE CROSS/BLUE SHIELD | Admitting: Family Medicine

## 2016-10-03 VITALS — BP 164/100 | HR 74 | Temp 97.7°F | Ht 60.0 in | Wt 138.0 lb

## 2016-10-03 DIAGNOSIS — I10 Essential (primary) hypertension: Secondary | ICD-10-CM | POA: Diagnosis not present

## 2016-10-03 DIAGNOSIS — R21 Rash and other nonspecific skin eruption: Secondary | ICD-10-CM

## 2016-10-03 MED ORDER — TRIAMCINOLONE ACETONIDE 0.1 % EX CREA: 1.0000 "application " | TOPICAL_CREAM | Freq: Two times a day (BID) | CUTANEOUS | 1 refills | Status: DC

## 2016-10-03 NOTE — Progress Notes (Signed)
Subjective:     Patient ID: Glenda Francis, female   DOB: 06-Oct-1951, 65 y.o.   MRN: [id number]  HPI  Patient seen for evaluation of skin rash right foot. First noted about 2-3 months ago. She has been applying some tea tree ointment which seems to be helping. She's had some minimal pruritus. She denies any vesicular rash. She describes dry scaly rash. Location only involves right foot. No left foot involvement. No known history of eczema. No exacerbating factors.  History of hypertension currently treated with losartan. Compliant with therapy. Not getting regular exercise recently.  Past Medical History:  Diagnosis Date  . Asthma    childhood  . Cervicogenic headache 08/20/2015   Left side  . Chicken pox   . Depression   . Hypertension   . Seizures ([place])    STARTED 21/2 YEARS AGO   Past Surgical History:  Procedure Laterality Date  . ABDOMINAL HYSTERECTOMY  1989   partial, menorrhagia  . CATARACT EXTRACTION Bilateral 10/26/2015, 12-04/2015   laser    reports that she has quit smoking. She has never used smokeless tobacco. She reports that she does not drink alcohol or use drugs. family history includes Asthma in her other; Cancer in her brother, brother, and mother; Colon cancer in her brother, brother, and other; Hyperlipidemia in her other. No Known Allergies   Review of Systems  Constitutional: Negative for fatigue.  Eyes: Negative for visual disturbance.  Respiratory: Negative for cough, chest tightness, shortness of breath and wheezing.   Cardiovascular: Negative for chest pain, palpitations and leg swelling.  Skin: Positive for rash.  Neurological: Negative for dizziness, seizures, syncope, weakness, light-headedness and headaches.       Objective:   Physical Exam  Constitutional: She appears well-developed and well-nourished.  Eyes: Pupils are equal, round, and reactive to light.  Neck: Neck supple. No JVD present. No thyromegaly present.  Cardiovascular: Normal rate  and regular rhythm.  Exam reveals no gallop.   Pulmonary/Chest: Effort normal and breath sounds normal. No respiratory distress. She has no wheezes. She has no rales.  Musculoskeletal: She exhibits no edema.  Neurological: She is alert.  Skin: Rash noted.  She has very minimal rash involving the dorsum right foot somewhat of a linear distribution over the medial mid aspect of the foot.  No pustules. Nontender.       Assessment:     #1 nonspecific right foot rash. This appears to be more eczematous or dry skin related.  #2 hypertension with elevated reading today but generally well-controlled    Plan:     -Triamcinolone 0.1% cream to use twice daily as needed for foot rash -Get back to her regular aerobic exercise -Reassess blood pressure in 2-3 weeks and consider additional medication at that point if not further improved  Eulas Post MD [facility] at [facility]

## 2016-10-03 NOTE — Progress Notes (Signed)
Pre visit review using our clinic review tool, if applicable. No additional management support is needed unless otherwise documented below in the visit note. 

## 2016-10-19 ENCOUNTER — Ambulatory Visit (INDEPENDENT_AMBULATORY_CARE_PROVIDER_SITE_OTHER): Payer: BLUE CROSS/BLUE SHIELD | Admitting: Family Medicine

## 2016-10-19 VITALS — BP 142/84 | HR 68 | Temp 98.2°F | Ht 60.0 in | Wt 138.4 lb

## 2016-10-19 DIAGNOSIS — I10 Essential (primary) hypertension: Secondary | ICD-10-CM | POA: Diagnosis not present

## 2016-10-19 NOTE — Progress Notes (Signed)
Subjective:     Patient ID: Glenda Francis, female   DOB: 09-10-1951, 65 y.o.   MRN: [id number]  HPI Patient here to reassess blood pressure. Refer to recent note. We obtained reading of 164/100. She takes losartan daily. She's been doing regular exercises with water exercise at the [facility] No chest pains. No dizziness. No headaches. Tries to avoid processed foods. Does not eat out much. Occasionally takes nonsteroidal such as Advil but not regularly. Minimal alcohol intake usually 4 ounces of wine per day or less  Past Medical History:  Diagnosis Date  . Asthma    childhood  . Cervicogenic headache 08/20/2015   Left side  . Chicken pox   . Depression   . Hypertension   . Seizures ([place])    STARTED 21/2 YEARS AGO   Past Surgical History:  Procedure Laterality Date  . ABDOMINAL HYSTERECTOMY  1989   partial, menorrhagia  . CATARACT EXTRACTION Bilateral 10/26/2015, 12-04/2015   laser    reports that she has quit smoking. She has never used smokeless tobacco. She reports that she does not drink alcohol or use drugs. family history includes Asthma in her other; Cancer in her brother, brother, and mother; Colon cancer in her brother, brother, and other; Hyperlipidemia in her other. No Known Allergies   Review of Systems  Constitutional: Negative for fatigue and unexpected weight change.  Eyes: Negative for visual disturbance.  Respiratory: Negative for cough, chest tightness, shortness of breath and wheezing.   Cardiovascular: Negative for chest pain, palpitations and leg swelling.  Neurological: Negative for dizziness, seizures, syncope, weakness, light-headedness and headaches.       Objective:   Physical Exam  Constitutional: She appears well-developed and well-nourished.  Eyes: Pupils are equal, round, and reactive to light.  Neck: Neck supple. No JVD present. No thyromegaly present.  Cardiovascular: Normal rate and regular rhythm.  Exam reveals no gallop.   Pulmonary/Chest: Effort  normal and breath sounds normal. No respiratory distress. She has no wheezes. She has no rales.  Musculoskeletal: She exhibits no edema.  Neurological: She is alert.       Assessment:     Hypertension. Improved on today's readings    Plan:     -Continue weight loss efforts -Try to keep sodium intake less than 2500 mg daily -Continue losartan -Routine follow-up in 3 months  Eulas Post MD  Primary Care at [facility]

## 2016-10-19 NOTE — Patient Instructions (Signed)
Monitor blood pressure and be in touch if consistently > 140/90. Continue with your regular exercises Try to keep sodium intake to less than 2500 mg per day.

## 2016-10-19 NOTE — Progress Notes (Signed)
Pre visit review using our clinic review tool, if applicable. No additional management support is needed unless otherwise documented below in the visit note. 

## 2017-02-01 ENCOUNTER — Ambulatory Visit (INDEPENDENT_AMBULATORY_CARE_PROVIDER_SITE_OTHER): Payer: BLUE CROSS/BLUE SHIELD | Admitting: Nurse Practitioner

## 2017-02-01 ENCOUNTER — Encounter: Payer: Self-pay | Admitting: Nurse Practitioner

## 2017-02-01 VITALS — BP 144/80 | HR 70 | Resp 18 | Ht 60.0 in | Wt 138.6 lb

## 2017-02-01 DIAGNOSIS — G40409 Other generalized epilepsy and epileptic syndromes, not intractable, without status epilepticus: Secondary | ICD-10-CM

## 2017-02-01 DIAGNOSIS — Z5181 Encounter for therapeutic drug level monitoring: Secondary | ICD-10-CM | POA: Diagnosis not present

## 2017-02-01 MED ORDER — DIVALPROEX SODIUM 500 MG PO DR TAB: DELAYED_RELEASE_TABLET | ORAL | 3 refills | Status: DC

## 2017-02-01 MED ORDER — LEVETIRACETAM 500 MG PO TABS: 1000.0000 mg | ORAL_TABLET | Freq: Two times a day (BID) | ORAL | 3 refills | Status: DC

## 2017-02-01 NOTE — Progress Notes (Signed)
[facility]  PATIENT: Glenda Francis DOB: 10-13-1951   REASON FOR VISIT: Follow-up for history of generalized seizure, headache and neck pain HISTORY FROM: Patient and daughter    HISTORY OF PRESENT ILLNESS:Glenda Francis is a 66 year old right-handed Hispanic female with a history of seizures associated with a panic attack like event followed by staring episodes or generalized seizure. The patient has done quite well on Keppra and Depakote combination, she has not had any further seizures since Depakote added in 2012.   The patient reports a chronic history of left neck and shoulder discomfort that has been present for greater than three  years. The patient indicates that the headaches will come up from the back of the head and go around the left eye associated with sharp pains. The patient notes a parallel between the neck and shoulder discomfort and the severity of the headache. She will also have some discomfort down into the arm, and she occasionally will have some dizziness. The patient denies any falls. She denies issues controlling the bowels or the bladder. She feels as if the neck is stiff.  Her neck pain and headaches are  stable at this time. She denies any pre-existing history of headache before June 2016. MRI of the brain 09/03/2015 without acute findings and no change from MRI from 02/19/2014.  She does not feel she needs additional medication for neck pain /headache.   She returns for reevaluation.   REVIEW OF SYSTEMS: Full 14 system review of systems performed and notable only for those listed, all others are neg:  Constitutional: neg  Cardiovascular: neg Ear/Nose/Throat: neg  Skin: neg Eyes: neg Respiratory: neg Gastroitestinal: neg  Hematology/Lymphatic: neg  Endocrine: neg Musculoskeletal: Neck stiffness, joint pain Allergy/Immunology: neg Neurological: History of seizure Psychiatric: neg Sleep : neg   ALLERGIES: No Known Allergies  HOME  MEDICATIONS: Outpatient Medications Prior to Visit  Medication Sig Dispense Refill  . Calcium Carbonate-Vitamin D (CALCIUM + D PO) Take 1 capsule by mouth daily.    . divalproex (DEPAKOTE) 500 MG DR tablet TAKE 1 TABLET (500 MG TOTAL) BY MOUTH 2 (TWO) TIMES DAILY. 180 tablet 1  . levETIRAcetam (KEPPRA) 500 MG tablet Take 2 tablets (1,000 mg total) by mouth every 12 (twelve) hours. 360 tablet 2  . losartan (COZAAR) 100 MG tablet TAKE 1 TABLET BY MOUTH EVERY DAY 30 tablet 5  . Multiple Vitamins-Minerals (MULTIVITAMIN PO) Take 1 capsule by mouth daily.    . Polyvinyl Alcohol-Povidone (REFRESH OP) Apply 1 drop to eye 2 (two) times daily as needed.     . triamcinolone cream (KENALOG) 0.1 % Apply 1 application topically 2 (two) times daily. 30 g 1  . Cyanocobalamin (VITAMIN B-12 CR PO) Take 1 capsule by mouth daily.    . DUREZOL 0.05 % EMUL 1 DROP IN RIGHT EYE THREE TIMES A DAY *PA* prn  1  . Simethicone (GAS-X PO) Take 1 capsule by mouth as needed.     No facility-administered medications prior to visit.     PAST MEDICAL HISTORY: Past Medical History:  Diagnosis Date  . Asthma    childhood  . Cervicogenic headache 08/20/2015   Left side  . Chicken pox   . Depression   . Hypertension   . Seizures ([place])    STARTED 21/2 YEARS AGO    PAST SURGICAL HISTORY: Past Surgical History:  Procedure Laterality Date  . ABDOMINAL HYSTERECTOMY  1989   partial, menorrhagia  . CATARACT EXTRACTION Bilateral 10/26/2015, 12-04/2015  laser    FAMILY HISTORY: Family History  Problem Relation Age of Onset  . Cancer Mother     uterine,ovarian  . Asthma Other   . Hyperlipidemia Other   . Cancer Brother     colon-PASSEDA WAY 54, pancreatic  . Colon cancer Brother   . Cancer Brother     colon,stomach,brain  . Colon cancer Brother   . Colon cancer Other     SOCIAL HISTORY: Social History   Social History  . Marital status: Divorced    Spouse name: N/A  . Number of children: 3  . Years of  education: college   Occupational History  . retired    Social History Main Topics  . Smoking status: Former Research scientist (life sciences)  . Smokeless tobacco: Never Used  . Alcohol use No     Comment: 1 GLASS WINE WITH DINNER  . Drug use: No  . Sexual activity: Not Currently   Other Topics Concern  . Not on file   Social History Narrative   Patient is divorced   Patient  has 3 children.   Patient is retired   Patient has some college.   Patient is currently doing yoga and water aerobics.   Patient drinks 1-2 cups of caffeine daily.   Patient is right handed.      PHYSICAL EXAM  Vitals:   02/01/17 1337  BP: (!) 144/80  Pulse: 70  Resp: 18  Weight: 138 lb 9.6 oz (62.9 kg)  Height: 5' (1.524 m)   Body mass index is 27.07 kg/m.  Generalized: Well developed, mildly obese female in no acute distress  Head: normocephalic and atraumatic,. Oropharynx benign  Neck: Supple, no carotid bruits  Cardiac: Regular rate rhythm, no murmur  Musculoskeletal: No deformity   Neurological examination   Mentation: Alert oriented to time, place, history taking. Attention span and concentration appropriate. Recent and remote memory intact.  Follows all commands speech and language fluent.   Cranial nerve II-XII: Fundoscopic exam reveals sharp disc margins.Pupils were equal round reactive to light extraocular movements were full, visual field were full on confrontational test. Facial sensation and strength were normal. hearing was intact to finger rubbing bilaterally. Uvula tongue midline. head turning and shoulder shrug were normal and symmetric.Tongue protrusion into cheek strength was normal. Motor: normal bulk and tone, full strength in the BUE, BLE, fine finger movements normal, no pronator drift. Sensory: Decrease in pinprick sensation on the left leg relative to the right symmetric, arms, decreased vibratory sensation on the left arm and leg, position sense is intact all 4 extremities no evidence of  extinction  Coordination: finger-nose-finger, heel-to-shin bilaterally, no dysmetria, no tremor Reflexes: Symmetric upper and lower plantar responses were flexor bilaterally. Gait and Station: Rising up from seated position without assistance, normal stance,  moderate stride, good arm swing, smooth turning, able to perform tiptoe, and heel walking without difficulty. Tandem gait is steady  DIAGNOSTIC DATA (LABS, IMAGING, TESTING) -  ASSESSMENT AND PLAN 66 y.o. year old female has a past medical history of Seizures ([place]); and Cervicogenic headache (08/20/2015). here to  follow-up. Last seizure was in 2012. Headaches and neck pain are in good control.   Continue Keppra at current dose will refill Continue Depakote at current dose  Will check CBC and CMP to monitor adverse effects of Depakote and level of drug for therapeutic level Call for seizure activity Follow-up Yearly Dennie Bible, [facility], [facility], [facility] [address], [place], [place] [address] [phone]  

## 2017-02-01 NOTE — Progress Notes (Signed)
I have read the note, and I agree with the clinical assessment and plan.  Glenda Francis   

## 2017-02-01 NOTE — Patient Instructions (Signed)
Continue Keppra at current dose will refill Continue Depakote at current dose  Will check CBC and CMP to monitor adverse effects of Depakote and level of drug for therapeutic level Call for seizure activity Follow-up Yearly

## 2017-02-02 ENCOUNTER — Telehealth: Payer: Self-pay

## 2017-02-02 LAB — CBC WITH DIFFERENTIAL/PLATELET
Basophils Absolute: 0 10*3/uL (ref 0.0–0.2)
Basos: 0 %
EOS (ABSOLUTE): 0.1 10*3/uL (ref 0.0–0.4)
EOS: 2 %
HEMATOCRIT: 37.1 % (ref 34.0–46.6)
HEMOGLOBIN: 12.8 g/dL (ref 11.1–15.9)
IMMATURE GRANULOCYTES: 0 %
Immature Grans (Abs): 0 10*3/uL (ref 0.0–0.1)
Lymphocytes Absolute: 3.1 10*3/uL (ref 0.7–3.1)
Lymphs: 55 %
MCH: 30.5 pg (ref 26.6–33.0)
MCHC: 34.5 g/dL (ref 31.5–35.7)
MCV: 89 fL (ref 79–97)
MONOS ABS: 0.6 10*3/uL (ref 0.1–0.9)
Monocytes: 10 %
NEUTROS PCT: 33 %
Neutrophils Absolute: 1.9 10*3/uL (ref 1.4–7.0)
Platelets: 221 10*3/uL (ref 150–379)
RBC: 4.19 x10E6/uL (ref 3.77–5.28)
RDW: 13.9 % (ref 12.3–15.4)
WBC: 5.7 10*3/uL (ref 3.4–10.8)

## 2017-02-02 LAB — COMPREHENSIVE METABOLIC PANEL
ALBUMIN: 4.1 g/dL (ref 3.6–4.8)
ALT: 17 IU/L (ref 0–32)
AST: 18 IU/L (ref 0–40)
Albumin/Globulin Ratio: 1.5 (ref 1.2–2.2)
Alkaline Phosphatase: 53 IU/L (ref 39–117)
BUN / CREAT RATIO: 20 (ref 12–28)
BUN: 16 mg/dL (ref 8–27)
Bilirubin Total: 0.4 mg/dL (ref 0.0–1.2)
CALCIUM: 9.5 mg/dL (ref 8.7–10.3)
CO2: 28 mmol/L (ref 18–29)
CREATININE: 0.79 mg/dL (ref 0.57–1.00)
Chloride: 96 mmol/L (ref 96–106)
GFR calc Af Amer: 91 mL/min/{1.73_m2} (ref >59)
GFR, EST NON AFRICAN AMERICAN: 79 mL/min/{1.73_m2} (ref >59)
GLOBULIN, TOTAL: 2.8 g/dL (ref 1.5–4.5)
GLUCOSE: 110 mg/dL — AB (ref 65–99)
Potassium: 4.7 mmol/L (ref 3.5–5.2)
SODIUM: 139 mmol/L (ref 134–144)
Total Protein: 6.9 g/dL (ref 6.0–8.5)

## 2017-02-02 LAB — VALPROIC ACID LEVEL: Valproic Acid Lvl: 109 ug/mL — ABNORMAL HIGH (ref 50–100)

## 2017-02-02 NOTE — Telephone Encounter (Signed)
Left vm for patient to call back about lab work. 

## 2017-02-02 NOTE — Telephone Encounter (Signed)
Rn receive incoming call from pt. Rn stated her labs were normal. Pt verbalized understanding.

## 2017-02-02 NOTE — Telephone Encounter (Signed)
RN came to phone room, advised pt labs were normal

## 2017-02-02 NOTE — Telephone Encounter (Signed)
-----   Message from Dennie Bible, NP sent at 02/02/2017 10:39 AM EST ----- Labs good please call the patient

## 2017-02-10 ENCOUNTER — Other Ambulatory Visit: Payer: Self-pay | Admitting: Family Medicine

## 2017-03-15 ENCOUNTER — Other Ambulatory Visit: Payer: Self-pay | Admitting: *Deleted

## 2017-03-15 MED ORDER — LOSARTAN POTASSIUM 100 MG PO TABS: 100.0000 mg | ORAL_TABLET | Freq: Every day | ORAL | 1 refills | Status: DC

## 2017-03-24 ENCOUNTER — Ambulatory Visit (INDEPENDENT_AMBULATORY_CARE_PROVIDER_SITE_OTHER): Payer: BLUE CROSS/BLUE SHIELD | Admitting: Family Medicine

## 2017-03-24 ENCOUNTER — Encounter: Payer: Self-pay | Admitting: Family Medicine

## 2017-03-24 VITALS — BP 148/90 | HR 64 | Temp 98.3°F | Wt 138.9 lb

## 2017-03-24 DIAGNOSIS — L309 Dermatitis, unspecified: Secondary | ICD-10-CM | POA: Diagnosis not present

## 2017-03-24 DIAGNOSIS — Z800 Family history of malignant neoplasm of digestive organs: Secondary | ICD-10-CM

## 2017-03-24 MED ORDER — TRIAMCINOLONE ACETONIDE 0.1 % EX CREA: 1.0000 "application " | TOPICAL_CREAM | Freq: Two times a day (BID) | CUTANEOUS | 3 refills | Status: DC | PRN

## 2017-03-24 NOTE — Progress Notes (Signed)
Pre visit review using our clinic review tool, if applicable. No additional management support is needed unless otherwise documented below in the visit note. 

## 2017-03-24 NOTE — Progress Notes (Signed)
Subjective:     Patient ID: Glenda Francis, female   DOB: 08/30/1951, 66 y.o.   MRN: [id number]  HPI  Patient's seen for the following issues:  Apparently several members her family been diagnosed with Lynch syndrome. She has a sister and brother that have tested "positive" and also a nephew. She states that both her nephew and brother have died of colon cancer. Her sister tested positive but has not had cancer. She also relates her mother died of ovarian cancer she had several maternal aunts that had cancer including colon cancer. She had previous colonoscopy 2014. She has never been screened for this (Lynch Syndrome).  She's had recurrent rash on her feet. Scaly and pruritic. Improved in the passed with steroid cream. She recently ran out.  No exacerbating features.  Past Medical History:  Diagnosis Date  . Asthma    childhood  . Cervicogenic headache 08/20/2015   Left side  . Chicken pox   . Depression   . Hypertension   . Seizures ([place])    STARTED 21/2 YEARS AGO   Past Surgical History:  Procedure Laterality Date  . ABDOMINAL HYSTERECTOMY  1989   partial, menorrhagia  . CATARACT EXTRACTION Bilateral 10/26/2015, 12-04/2015   laser    reports that she has quit smoking. She has never used smokeless tobacco. She reports that she does not drink alcohol or use drugs. family history includes Asthma in her other; Cancer in her brother, brother, and mother; Colon cancer in her brother, brother, and other; Hyperlipidemia in her other. No Known Allergies  Review of Systems  Constitutional: Negative for fatigue.  Eyes: Negative for visual disturbance.  Respiratory: Negative for cough, chest tightness, shortness of breath and wheezing.   Cardiovascular: Negative for chest pain, palpitations and leg swelling.  Neurological: Negative for dizziness, seizures, syncope, weakness, light-headedness and headaches.       Objective:   Physical Exam  Constitutional: She appears well-developed and  well-nourished.  Eyes: Pupils are equal, round, and reactive to light.  Neck: Neck supple. No JVD present. No thyromegaly present.  Cardiovascular: Normal rate and regular rhythm.  Exam reveals no gallop.   Pulmonary/Chest: Effort normal and breath sounds normal. No respiratory distress. She has no wheezes. She has no rales.  Musculoskeletal: She exhibits no edema.  Neurological: She is alert.  Skin: Rash noted.  Patient's has mildly erythematous scaly irregular rash medial aspect of the right foot on the heel.       Assessment:     #1 positive reported family history of Lynch syndrome in multiple members  #2 eczematous rash of the foot    Plan:     -Refill triamcinolone 0.1% cream used twice daily as needed -Consider further testing for Lynch syndrome. -We discussed some of the complexities of testing  -recommend referral back to GI to discuss issues further regarding screening for Lynch Syndrome.  Eulas Post MD [facility] at [facility]

## 2017-03-24 NOTE — Patient Instructions (Addendum)
We will figure out how to get blood work for testing for Lynch Syndrome. Let your neurologist know if dizziness persists.

## 2017-03-26 DIAGNOSIS — Z800 Family history of malignant neoplasm of digestive organs: Secondary | ICD-10-CM | POA: Insufficient documentation

## 2017-03-29 NOTE — Addendum Note (Signed)
Addended by: Eulas Post on: 03/29/2017 07:00 AM   Modules accepted: Orders

## 2017-03-31 IMAGING — MG 2D DIGITAL DIAGNOSTIC UNILATERAL LEFT MAMMOGRAM WITH CAD AND ADJ
5 series · 6 of 13 positions shown · non-contrast
Comparison: Previous exam(s).

CLINICAL DATA: Screening recall for left breast asymmetry.

EXAM:
2D DIGITAL DIAGNOSTIC UNILATERAL LEFT MAMMOGRAM WITH CAD AND ADJUNCT
TOMO
LEFT BREAST ULTRASOUND

[L CC]
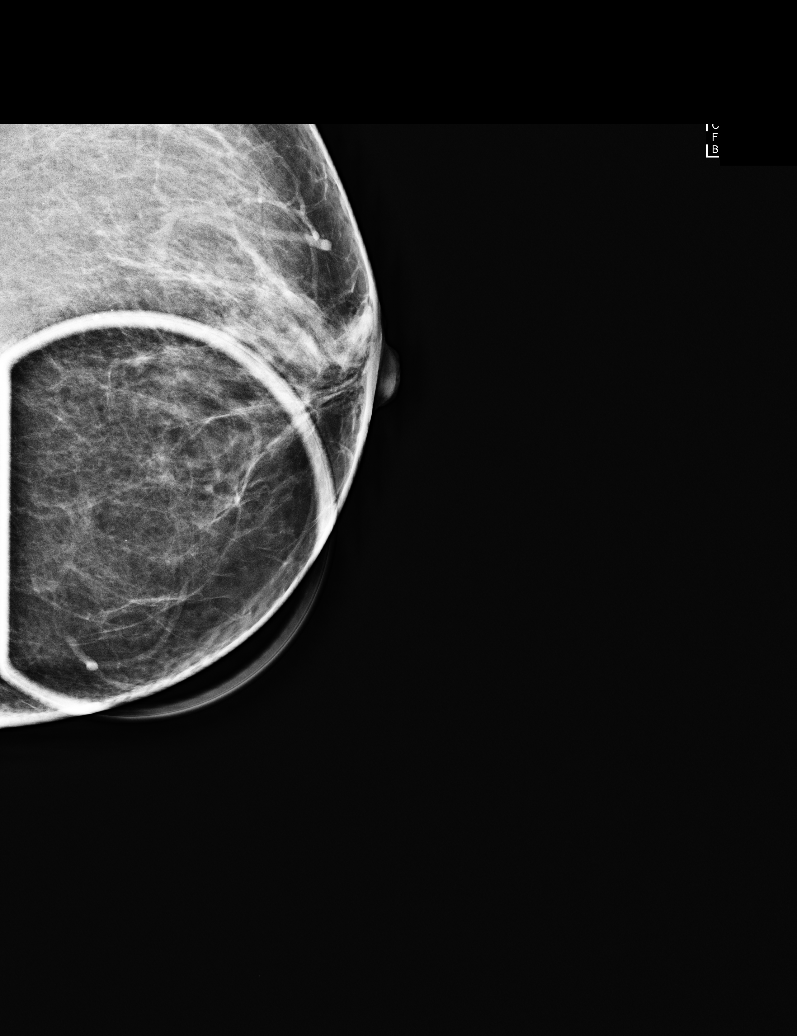

[L ML synth-2D]
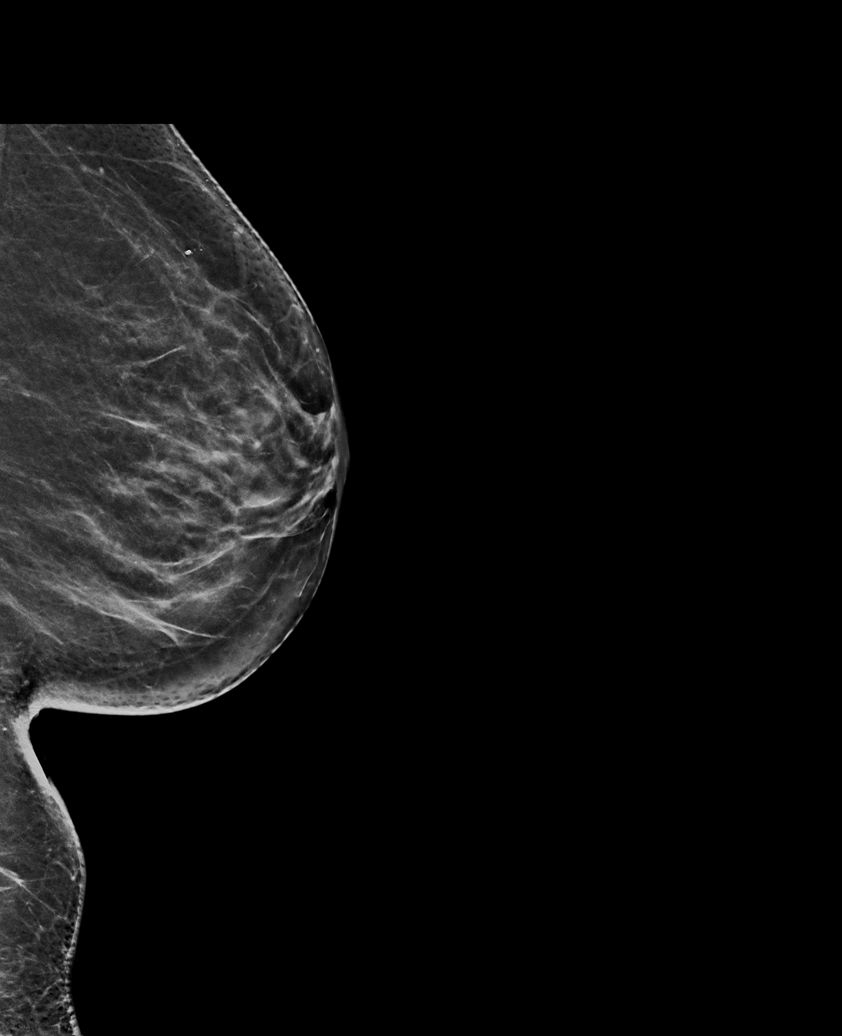

[L ML]
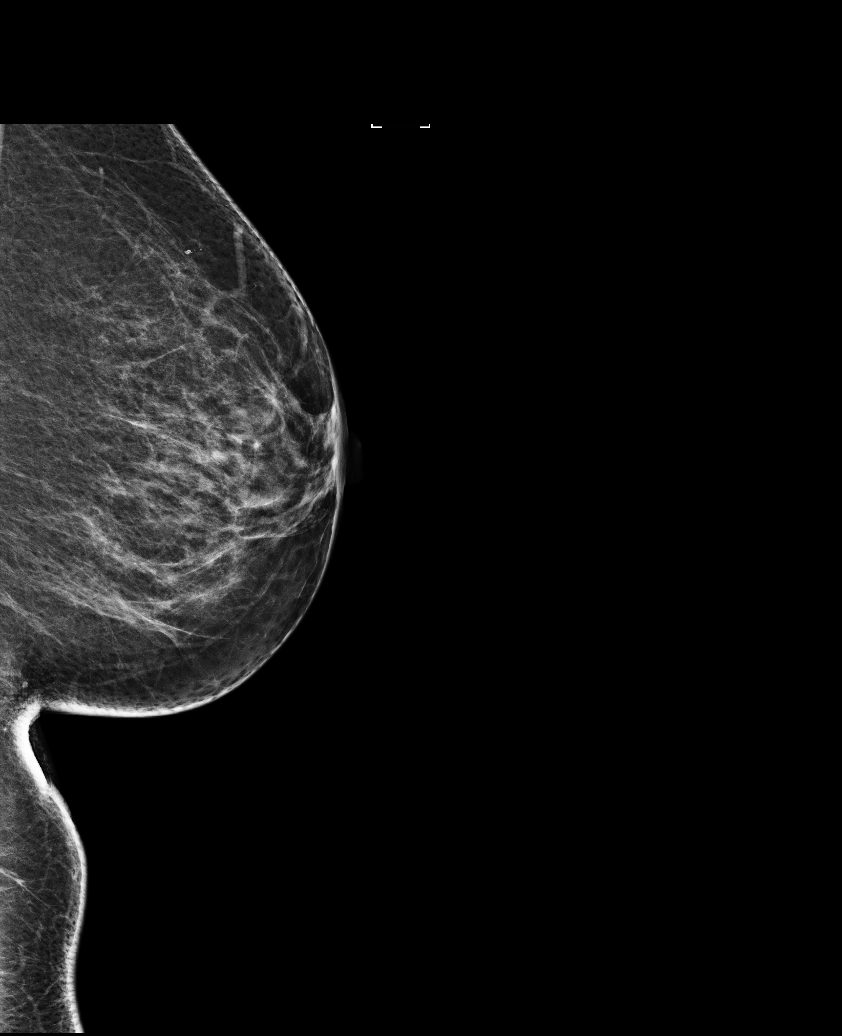

[L ML tomo · 2 of 68 frames shown]
[frame 22/68]
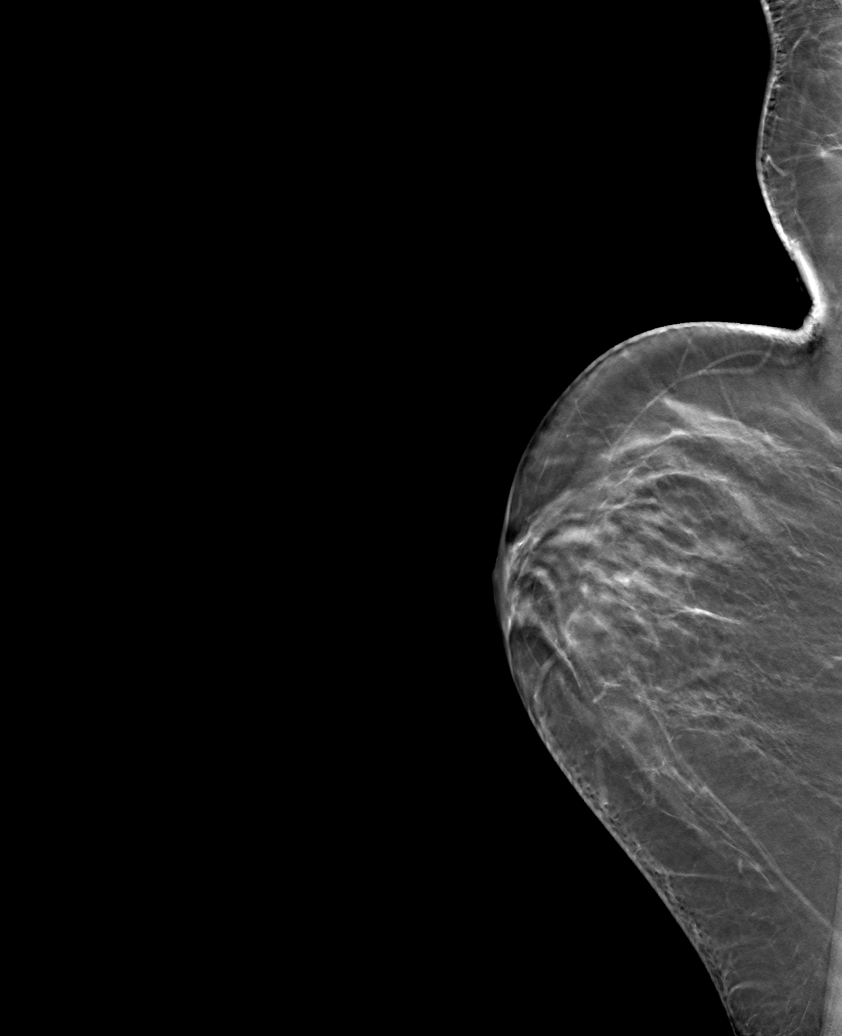
[frame 35/68]
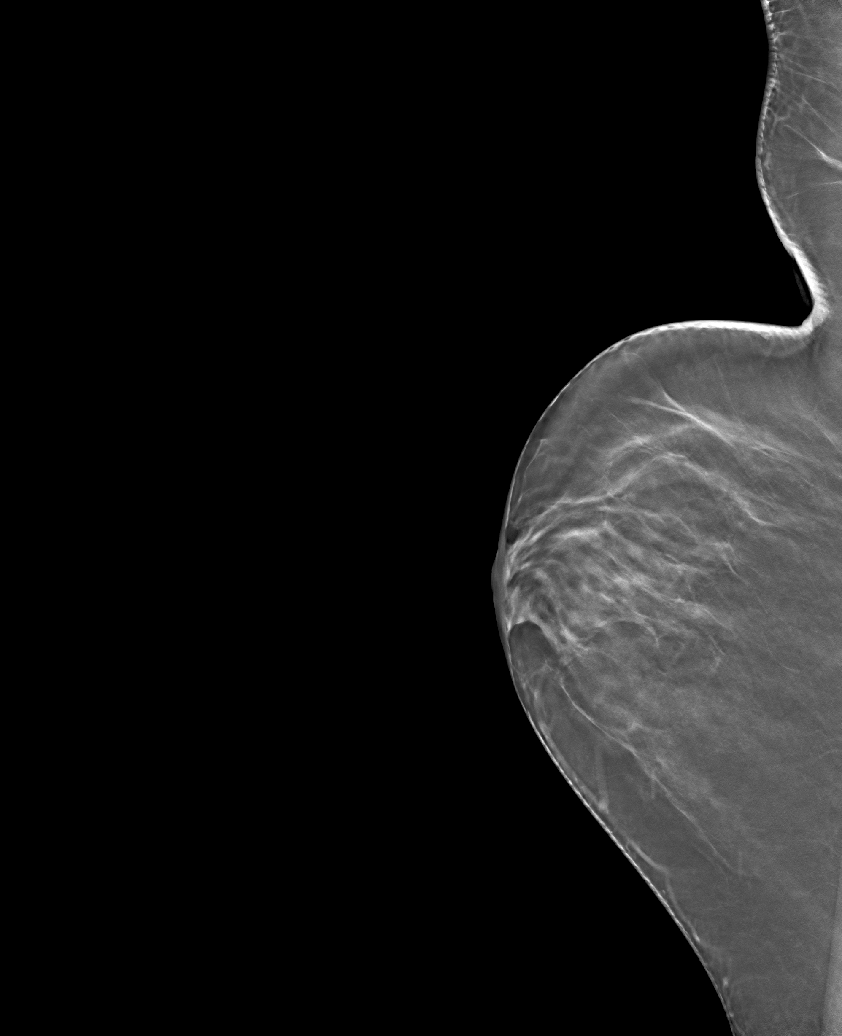

[L CC tomo · tomo slice 27/52.0]
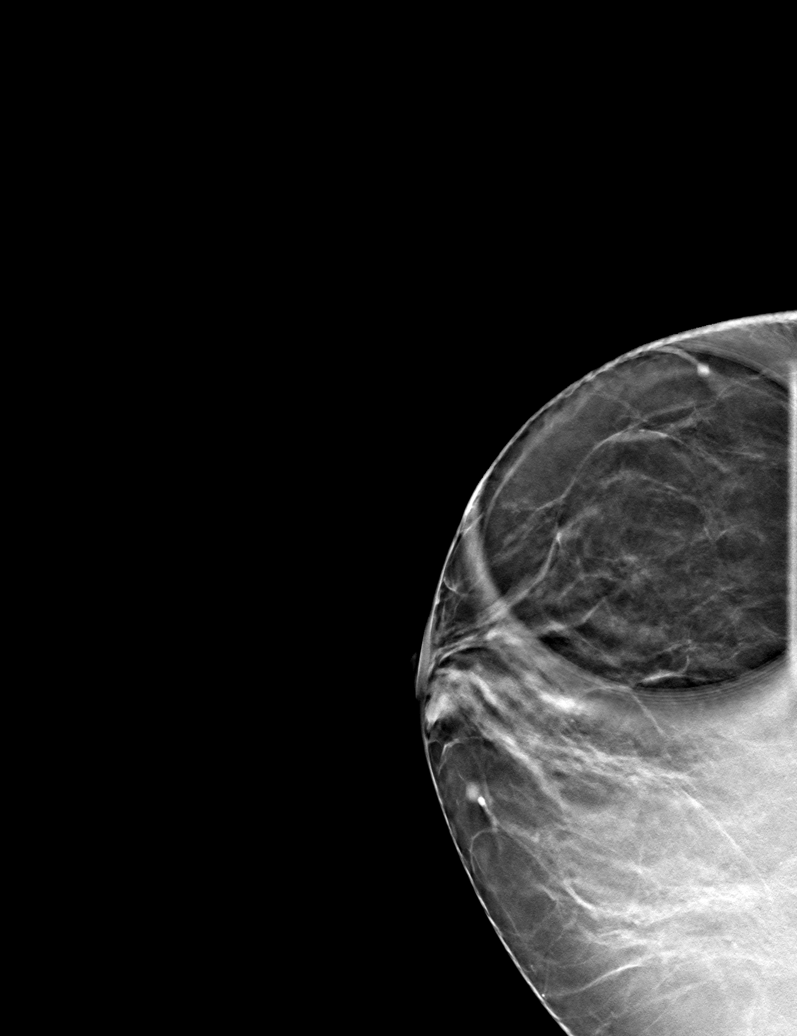

[6 of 13 positions shown; findings below may reference images not displayed]

ACR Breast Density Category b: There are scattered areas of
fibroglandular density.
FINDINGS: The asymmetry in the medial aspect of the left breast appears to
resolve with additional spot compression tomosynthesis imaging,
consistent with overlapping fibroglandular tissue. Ultrasound will
be performed for further confirmation.

Mammographic images were processed with CAD.

Physical exam of the medial aspect of the left breast demonstrates
no discrete palpable masses.

Ultrasound of the medial left breast scanned from 7-10 o'clock
demonstrates normal fibroglandular tissue. No masses or suspicious
areas of shadowing are identified.
IMPRESSION: 1. Resolution of the asymmetry of concern in the medial left breast.
This is consistent with overlapping fibroglandular tissue.

2.  No mammographic evidence of left breast malignancy.

RECOMMENDATION:
Screening mammogram in one year.(Code:WJ-[id number])

I have discussed the findings and recommendations with the patient.
Results were also provided in writing at the conclusion of the
visit. If applicable, a reminder letter will be sent to the patient
regarding the next appointment.

BI-RADS CATEGORY  1: Negative.

## 2017-05-09 ENCOUNTER — Encounter: Payer: Self-pay | Admitting: Genetic Counselor

## 2017-07-10 ENCOUNTER — Ambulatory Visit (HOSPITAL_BASED_OUTPATIENT_CLINIC_OR_DEPARTMENT_OTHER): Payer: BLUE CROSS/BLUE SHIELD | Admitting: Genetic Counselor

## 2017-07-10 ENCOUNTER — Encounter: Payer: Self-pay | Admitting: Genetic Counselor

## 2017-07-10 ENCOUNTER — Other Ambulatory Visit: Payer: BLUE CROSS/BLUE SHIELD

## 2017-07-10 DIAGNOSIS — Z800 Family history of malignant neoplasm of digestive organs: Secondary | ICD-10-CM

## 2017-07-10 DIAGNOSIS — Z315 Encounter for genetic counseling: Secondary | ICD-10-CM | POA: Diagnosis not present

## 2017-07-10 DIAGNOSIS — Z8041 Family history of malignant neoplasm of ovary: Secondary | ICD-10-CM

## 2017-07-10 DIAGNOSIS — Z808 Family history of malignant neoplasm of other organs or systems: Secondary | ICD-10-CM | POA: Diagnosis not present

## 2017-07-10 DIAGNOSIS — Z8049 Family history of malignant neoplasm of other genital organs: Secondary | ICD-10-CM | POA: Insufficient documentation

## 2017-07-10 NOTE — Progress Notes (Signed)
REFERRING PROVIDER: Eulas Post, MD [place], [place] [address]  PRIMARY PROVIDER:  Eulas Post, MD  PRIMARY REASON FOR VISIT:  1. Family history of colon cancer   2. Family history of uterine cancer   3. Family history of ovarian cancer   4. Family history of pancreatic cancer   5. Family history of stomach cancer   6. Family history of Lynch syndrome      HISTORY OF PRESENT ILLNESS:   Ms. Quito, a 66 y.o. female, was seen for a [facility] cancer genetics consultation at the request of Dr. Elease Hashimoto due to a family history of cancer.  Ms. Wease presents to clinic today to discuss the possibility of a hereditary predisposition to cancer, genetic testing, and to further clarify her future cancer risks, as well as potential cancer risks for family members. Ms. Schwan is a 66 y.o. female with no personal history of cancer.  She has a strong family history of cancer.  CANCER HISTORY:   No history exists.     HORMONAL RISK FACTORS:  Menarche was at age 82-13.  First live birth at age 85.  OCP use for approximately 2-3 years.  Ovaries intact: yes.  Hysterectomy: yes.  Menopausal status: postmenopausal.  HRT use: 0 years. Colonoscopy: yes; a couple polyps. Mammogram within the last year: yes. Number of breast biopsies: 0. Up to date with pelvic exams:  yes. Any excessive radiation exposure in the past:  no  Past Medical History:  Diagnosis Date  . Asthma    childhood  . Cervicogenic headache 08/20/2015   Left side  . Chicken pox   . Depression   . Family history of colon cancer   . Family history of ovarian cancer   . Family history of pancreatic cancer   . Family history of stomach cancer   . Family history of uterine cancer   . Hypertension   . Seizures ([place])    STARTED 21/2 YEARS AGO    Past Surgical History:  Procedure Laterality Date  . ABDOMINAL HYSTERECTOMY  1989   partial, menorrhagia  . CATARACT EXTRACTION Bilateral  10/26/2015, 12-04/2015   laser    Social History   Social History  . Marital status: Divorced    Spouse name: N/A  . Number of children: 3  . Years of education: college   Occupational History  . retired    Social History Main Topics  . Smoking status: Former Research scientist (life sciences)  . Smokeless tobacco: Never Used  . Alcohol use No     Comment: 1 GLASS WINE WITH DINNER  . Drug use: No  . Sexual activity: Not Currently   Other Topics Concern  . None   Social History Narrative   Patient is divorced   Patient  has 3 children.   Patient is retired   Patient has some college.   Patient is currently doing yoga and water aerobics.   Patient drinks 1-2 cups of caffeine daily.   Patient is right handed.      FAMILY HISTORY:  We obtained a detailed, 4-generation family history.  Significant diagnoses are listed below: Family History  Problem Relation Age of Onset  . Cancer Mother        uterine,ovarian  . Cancer Brother        colon-PASSEDA WAY 54, pancreatic  . Colon cancer Brother   . Cancer Brother        colon,stomach,brain  . Colon cancer Brother   . Other  Sister        Pos for Lynch syndrome  . Colon cancer Other        died at 72  . Asthma Other   . Hyperlipidemia Other   . Cancer Maternal Aunt        uterine and ovarian  . Cancer Maternal Aunt        ovarian and uterine  . Uterine cancer Other        died at 64  . Colon cancer Cousin        6 maternal cousins with colon cancer    The patient has three children.  One son died from non cancer related issues.  She has three brothers and one sister.  The oldest brother died of colon cancer and pancreatic cancer at 14 and his daughter had uterine cancer and died at 21. Another brother is dying of colon and stomach cancer.  The patient's sister has tested positive for Lynch syndrome after her son died of colon cancer at age 13.    The patient's mother died of uterine and ovarian cancer at 13.  She had three sisters and a  brother.  Two sisters died of ovarian and uterine cancer.  They each had multiple children with colon cancer.  The maternal grandparents did not have cancer, but her grandmother's mother had cancer.  The patient's father died at 16.  He had one sister.  One of her daughters had breast cancer.  There is no other cancer reported on the paternal side.  Patient's maternal ancestors are of [place] descent, and paternal ancestors are of [place] descent. There is no reported Ashkenazi Jewish ancestry. There is no known consanguinity.  GENETIC COUNSELING ASSESSMENT: Ashlin Kreps is a 66 y.o. female with a family history of colon, pancreatic, stomach, ovarian and uterine cancer which is somewhat suggestive of a Lynch syndrome and predisposition to cancer. We, therefore, discussed and recommended the following at today's visit.   DISCUSSION: We discussed that about 5-6% of colon cancer is hereditary with most cases due to Lynch syndrome.  We discussed that there are 5 genes associated with Lynch syndrome.  Reportedly her sister has tested positive for one of the Lynch syndrome genes.  Based on this information the patient has a 50% chance of also testing positive.  There are other genes associated with hereditary colon cancer syndromes.  Since about 6% of patients will have more than one hereditary mutation, we will consider a larger panel.  We reviewed the characteristics, features and inheritance patterns of hereditary cancer syndromes. We also discussed genetic testing, including the appropriate family members to test, the process of testing, insurance coverage and turn-around-time for results. We discussed the implications of a negative, positive and/or variant of uncertain significant result. We recommended Ms. Soliday pursue genetic testing for the Common hereditary cancer gene panel. The Hereditary Gene Panel offered by [facility] includes sequencing and/or deletion duplication testing of  the following 46 genes: APC, ATM, AXIN2, BARD1, BMPR1A, BRCA1, BRCA2, BRIP1, CDH1, CDKN2A (p14ARF), CDKN2A (p16INK4a), CHEK2, CTNNA1, DICER1, EPCAM (Deletion/duplication testing only), GREM1 (promoter region deletion/duplication testing only), KIT, MEN1, MLH1, MSH2, MSH3, MSH6, MUTYH, NBN, NF1, NHTL1, PALB2, PDGFRA, PMS2, POLD1, POLE, PTEN, RAD50, RAD51C, RAD51D, SDHB, SDHC, SDHD, SMAD4, SMARCA4. STK11, TP53, TSC1, TSC2, and VHL.  The following genes were evaluated for sequence changes only: SDHA and HOXB13 c.251G>A variant only.  Based on Ms. Biggins's family history of cancer, she meets medical criteria for genetic testing.  Despite that she meets criteria, she may still have an out of pocket cost. We discussed that if her out of pocket cost for testing is over $100, the laboratory will call and confirm whether she wants to proceed with testing.  If the out of pocket cost of testing is less than $100 she will be billed by the genetic testing laboratory.   PLAN: After considering the risks, benefits, and limitations, Ms. Lopezgarcia  provided informed consent to pursue genetic testing and the blood sample was sent to [facility] for analysis of the Common Hereditary Cancer Panel. Results should be available within approximately 2-3 weeks' time, at which point they will be disclosed by telephone to Ms. Hendry, as will any additional recommendations warranted by these results. Ms. Maybee will receive a summary of her genetic counseling visit and a copy of her results once available. This information will also be available in Epic. We encouraged Ms. Haughey to remain in contact with cancer genetics annually so that we can continuously update the family history and inform her of any changes in cancer genetics and testing that may be of benefit for her family. Ms. Misenheimer questions were answered to her satisfaction today. Our contact information was provided should additional questions or concerns arise.  Lastly, we  encouraged Ms. Plush to remain in contact with cancer genetics annually so that we can continuously update the family history and inform her of any changes in cancer genetics and testing that may be of benefit for this family.   Ms.  Basaldua questions were answered to her satisfaction today. Our contact information was provided should additional questions or concerns arise. Thank you for the referral and allowing [place] to share in the care of your patient.   Odai Wimmer P. Florene Glen, [place], [facility] Certified Genetic Counselor [email or web address] phone: [phone]  The patient was seen for a total of 50 minutes in face-to-face genetic counseling.  This patient was discussed with Drs. Magrinat, Lindi Adie and/or Burr Medico who agrees with the above.    _______________________________________________________________________ For Office Staff:  Number of people involved in session: 1 Was an Intern/ student involved with case: yes: Rise Paganini

## 2017-07-19 ENCOUNTER — Encounter: Payer: Self-pay | Admitting: Genetic Counselor

## 2017-07-19 ENCOUNTER — Telehealth: Payer: Self-pay | Admitting: Genetic Counselor

## 2017-07-19 DIAGNOSIS — Z1379 Encounter for other screening for genetic and chromosomal anomalies: Secondary | ICD-10-CM | POA: Insufficient documentation

## 2017-07-19 NOTE — Telephone Encounter (Signed)
LM on VM with good news.  Asked that she CB. 

## 2017-07-20 ENCOUNTER — Ambulatory Visit: Payer: Self-pay | Admitting: Genetic Counselor

## 2017-07-20 ENCOUNTER — Telehealth: Payer: Self-pay | Admitting: Genetic Counselor

## 2017-07-20 DIAGNOSIS — Z8049 Family history of malignant neoplasm of other genital organs: Secondary | ICD-10-CM

## 2017-07-20 DIAGNOSIS — Z800 Family history of malignant neoplasm of digestive organs: Secondary | ICD-10-CM

## 2017-07-20 DIAGNOSIS — Z8041 Family history of malignant neoplasm of ovary: Secondary | ICD-10-CM

## 2017-07-20 DIAGNOSIS — Z1379 Encounter for other screening for genetic and chromosomal anomalies: Secondary | ICD-10-CM

## 2017-07-20 NOTE — Telephone Encounter (Signed)
Revealed negative genetic testing.  Discussed that I would like to see a copy of her sister's genetic testing that confirmed that she had Lynch syndrome.  This will help confirm for [place] that her sister is truly positive, and that Najla's risk for cancer is not elevated based on family history.

## 2017-07-20 NOTE — Progress Notes (Signed)
HPI: Ms. Fogg was previously seen in the [facility] clinic due to a family history of cancer, a reported family history of Lynch syndrome and concerns regarding a hereditary predisposition to cancer. Please refer to our prior cancer genetics clinic note for more information regarding Ms. Frericks's medical, social and family histories, and our assessment and recommendations, at the time. Ms. Messinger recent genetic test results were disclosed to her, as were recommendations warranted by these results. These results and recommendations are discussed in more detail below.  CANCER HISTORY:   No history exists.    FAMILY HISTORY:  We obtained a detailed, 4-generation family history.  Significant diagnoses are listed below: Family History  Problem Relation Age of Onset  . Cancer Mother        uterine,ovarian  . Cancer Brother        colon-PASSEDA WAY 54, pancreatic  . Colon cancer Brother   . Cancer Brother        colon,stomach,brain  . Colon cancer Brother   . Other Sister        Pos for Lynch syndrome  . Colon cancer Other        died at 80  . Asthma Other   . Hyperlipidemia Other   . Cancer Maternal Aunt        uterine and ovarian  . Cancer Maternal Aunt        ovarian and uterine  . Uterine cancer Other        died at 33  . Colon cancer Cousin        6 maternal cousins with colon cancer    The patient has three children.  One son died from non cancer related issues.  She has three brothers and one sister.  The oldest brother died of colon cancer and pancreatic cancer at 8 and his daughter had uterine cancer and died at 46. Another brother is dying of colon and stomach cancer.  The patient's sister has tested positive for Lynch syndrome after her son died of colon cancer at age 48.    The patient's mother died of uterine and ovarian cancer at 30.  She had three sisters and a brother.  Two sisters died of ovarian and uterine cancer.  They each had multiple children  with colon cancer.  The maternal grandparents did not have cancer, but her grandmother's mother had cancer.  The patient's father died at 19.  He had one sister.  One of her daughters had breast cancer.  There is no other cancer reported on the paternal side.  Patient's maternal ancestors are of [place] descent, and paternal ancestors are of [place] descent. There is no reported Ashkenazi Jewish ancestry. There is no known consanguinity.  GENETIC TEST RESULTS: Genetic testing reported out on July 16, 2017 through the common hereditary cancer panel found no deleterious mutations.  The Hereditary Gene Panel offered by [facility] includes sequencing and/or deletion duplication testing of the following 46 genes: APC, ATM, AXIN2, BARD1, BMPR1A, BRCA1, BRCA2, BRIP1, CDH1, CDKN2A (p14ARF), CDKN2A (p16INK4a), CHEK2, CTNNA1, DICER1, EPCAM (Deletion/duplication testing only), GREM1 (promoter region deletion/duplication testing only), KIT, MEN1, MLH1, MSH2, MSH3, MSH6, MUTYH, NBN, NF1, NHTL1, PALB2, PDGFRA, PMS2, POLD1, POLE, PTEN, RAD50, RAD51C, RAD51D, SDHB, SDHC, SDHD, SMAD4, SMARCA4. STK11, TP53, TSC1, TSC2, and VHL.  The following genes were evaluated for sequence changes only: SDHA and HOXB13 c.251G>A variant only.  The test report has been scanned into EPIC and is located under the  Molecular Pathology section of the Results Review tab.   We discussed with Ms. Alling that since the current genetic testing is not perfect, it is possible there may be a gene mutation in one of these genes that current testing cannot detect, but that chance is small. We also discussed, that it is possible that another gene that has not yet been discovered, or that we have not yet tested, is responsible for the cancer diagnoses in the family, and it is, therefore, important to remain in touch with cancer genetics in the future so that we can continue to offer Ms. Partridge the most up to date genetic testing.     We discussed that we would like to see a copy of her sister's genetic testing to confirm that she has a pathogenic mutation within the Lynch syndrome genes, vs a variant of uncertain significance, vs a genetic change in a gene that does not contribute to Lynch syndrome.  If we can get a copy of the report and confirm that Ms. Severs does not have the familial mutation we would call this result a true negative result.  It would be a true negative result because the cancer-causing mutation was identified in Ms. Sallade's family, and she did not inherit it.  At that time, we would say that Ms. Duggin's chances of developing Lynch-related cancers are the same as they are in the general population.     CANCER SCREENING RECOMMENDATIONS: This normal result is reassuring and indicates that Ms. Entwistle does not likely have an increased risk of cancer due to a mutation in one of these genes.  We, therefore, recommended  Ms. Estill continue to follow the cancer screening guidelines provided by her primary healthcare providers.   RECOMMENDATIONS FOR FAMILY MEMBERS: Individuals in this family might be at some increased risk of developing cancer, over the general population risk, simply due to the family history of cancer. We recommended women in this family have a yearly mammogram beginning at age 51, or 54 years younger than the earliest onset of cancer, an annual clinical breast exam, and perform monthly breast self-exams. Women in this family should also have a gynecological exam as recommended by their primary provider. All family members should have a colonoscopy by age 81.  FOLLOW-UP: Lastly, we discussed with Ms. Inman that cancer genetics is a rapidly advancing field and it is possible that new genetic tests will be appropriate for her and/or her family members in the future. We encouraged her to remain in contact with cancer genetics on an annual basis so we can update her personal and family histories and let her  know of advances in cancer genetics that may benefit this family.   Our contact number was provided. Ms. Dingus questions were answered to her satisfaction, and she knows she is welcome to call us at anytime with additional questions or concerns.   Roma Kayser, MS, [facility] Certified Genetic Counselor [email or web address]

## 2017-07-24 ENCOUNTER — Encounter: Payer: Self-pay | Admitting: Family Medicine

## 2017-07-25 ENCOUNTER — Telehealth: Payer: Self-pay | Admitting: Genetic Counselor

## 2017-07-25 NOTE — Telephone Encounter (Signed)
LM on VM that I have the results of her family members and to please call back for discussion.

## 2017-07-25 NOTE — Telephone Encounter (Signed)
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  Discussed that this mutation was NOT identified in her, and therefore this is a true negative result.  Glenda Francis voiced her understanding.

## 2017-10-11 ENCOUNTER — Encounter: Payer: Self-pay | Admitting: Family Medicine

## 2017-10-11 ENCOUNTER — Ambulatory Visit: Payer: BLUE CROSS/BLUE SHIELD | Admitting: Family Medicine

## 2017-10-11 VITALS — BP 140/86 | HR 71 | Temp 98.4°F | Wt 139.5 lb

## 2017-10-11 DIAGNOSIS — R300 Dysuria: Secondary | ICD-10-CM

## 2017-10-11 LAB — URINALYSIS, ROUTINE W REFLEX MICROSCOPIC
BILIRUBIN URINE: NEGATIVE
HGB URINE DIPSTICK: NEGATIVE
Ketones, ur: NEGATIVE
Leukocytes, UA: NEGATIVE
NITRITE: NEGATIVE
RBC / HPF: NONE SEEN (ref 0–?)
Specific Gravity, Urine: 1.01 (ref 1.000–1.030)
Total Protein, Urine: NEGATIVE
Urine Glucose: NEGATIVE
Urobilinogen, UA: 0.2 (ref 0.0–1.0)
pH: 6.5 (ref 5.0–8.0)

## 2017-10-11 LAB — POCT URINALYSIS DIPSTICK
BILIRUBIN UA: NEGATIVE
GLUCOSE UA: NEGATIVE
KETONES UA: NEGATIVE
Leukocytes, UA: NEGATIVE
Nitrite, UA: NEGATIVE
Protein, UA: NEGATIVE
SPEC GRAV UA: 1.015 (ref 1.010–1.025)
Urobilinogen, UA: 0.2 E.U./dL
pH, UA: 6.5 (ref 5.0–8.0)

## 2017-10-11 MED ORDER — CIPROFLOXACIN HCL 500 MG PO TABS: 500.0000 mg | ORAL_TABLET | Freq: Two times a day (BID) | ORAL | 0 refills | Status: DC

## 2017-10-11 NOTE — Patient Instructions (Signed)
Drink lots of fluids We will call with culture results Doubt this is UTI, but start the antibiotic for any fever worsening symptoms

## 2017-10-11 NOTE — Progress Notes (Signed)
Subjective:     Patient ID: Glenda Francis, female   DOB: June 12, 1951, 66 y.o.   MRN: [id number]  HPI Patient seen with concerns for possible UTI. She's had one day history of mild intermittent burning with urination. She also has some bilateral lumbar back pain but started a new exercise class and thinks that may be related. She has no fever. No history of kidney stones. No gross hematuria. Mild suprapubic discomfort. No nausea or vomiting.  Patient had similar symptoms back in January and culture grew out low colony count of enterococcus.  Past Medical History:  Diagnosis Date  . Asthma    childhood  . Cervicogenic headache 08/20/2015   Left side  . Chicken pox   . Depression   . Family history of colon cancer   . Family history of ovarian cancer   . Family history of pancreatic cancer   . Family history of stomach cancer   . Family history of uterine cancer   . Hypertension   . Seizures ([place])    STARTED 21/2 YEARS AGO   Past Surgical History:  Procedure Laterality Date  . ABDOMINAL HYSTERECTOMY  1989   partial, menorrhagia  . CATARACT EXTRACTION Bilateral 10/26/2015, 12-04/2015   laser    reports that she has quit smoking. she has never used smokeless tobacco. She reports that she does not drink alcohol or use drugs. family history includes Asthma in her other; Cancer in her brother, brother, maternal aunt, maternal aunt, and mother; Colon cancer in her brother, brother, cousin, and other; Hyperlipidemia in her other; Other in her sister; Uterine cancer in her other. No Known Allergies   Review of Systems  Constitutional: Negative for chills and fever.  Gastrointestinal: Negative for abdominal pain, nausea and vomiting.  Genitourinary: Positive for dysuria. Negative for hematuria.  Musculoskeletal: Positive for back pain.       Objective:   Physical Exam  Constitutional: She appears well-developed and well-nourished.  Cardiovascular: Normal rate and regular rhythm.   Pulmonary/Chest: Effort normal and breath sounds normal. No respiratory distress. She has no wheezes. She has no rales.  Musculoskeletal:  Straight leg raises are negative bilaterally  Neurological:  Full strength lower extremities.       Assessment:     Dysuria. Urine dipstick reveals only trace blood otherwise negative.    Plan:     -Urine culture sent and urine microscopy sent -Stay well-hydrated -Printed prescription for Cipro 500 mg twice a day for 5 days if her symptoms worsen -otherwise wait for culture results  Eulas Post MD [facility] at [facility]

## 2017-10-12 LAB — URINE CULTURE
MICRO NUMBER: 81284127
RESULT: NO GROWTH
SPECIMEN QUALITY: ADEQUATE

## 2017-10-13 ENCOUNTER — Telehealth: Payer: Self-pay | Admitting: Family Medicine

## 2017-10-13 NOTE — Telephone Encounter (Signed)
Urine culture results given to patient. Pt voiced understanding.

## 2017-10-28 ENCOUNTER — Other Ambulatory Visit: Payer: Self-pay | Admitting: Family Medicine

## 2017-10-30 DIAGNOSIS — H26491 Other secondary cataract, right eye: Secondary | ICD-10-CM | POA: Diagnosis not present

## 2017-11-18 ENCOUNTER — Other Ambulatory Visit: Payer: Self-pay | Admitting: Nurse Practitioner

## 2017-12-13 ENCOUNTER — Encounter: Payer: Self-pay | Admitting: Family Medicine

## 2017-12-13 ENCOUNTER — Ambulatory Visit (INDEPENDENT_AMBULATORY_CARE_PROVIDER_SITE_OTHER): Payer: Medicare Other | Admitting: Family Medicine

## 2017-12-13 VITALS — BP 110/70 | HR 73 | Temp 97.9°F | Wt 138.4 lb

## 2017-12-13 DIAGNOSIS — S76011A Strain of muscle, fascia and tendon of right hip, initial encounter: Secondary | ICD-10-CM

## 2017-12-13 MED ORDER — MELOXICAM 15 MG PO TABS: 15.0000 mg | ORAL_TABLET | Freq: Every day | ORAL | 0 refills | Status: DC

## 2017-12-13 NOTE — Progress Notes (Signed)
Subjective:     Patient ID: Glenda Francis, female   DOB: 1950/12/04, 67 y.o.   MRN: [id number]  HPI Patient seen with right hip strain. About a week and a half ago she was walking along and her foot got caught on a surface and she tried to catch her weight from going forward and felt a pain in her right hip region. Since that time she's had some pain mostly lateral hip region occasionally radiating toward the buttock. Denies any low back pain. No numbness. No weakness. She has limited her exercise since then. Is taking only occasional ibuprofen without much relief. Her daughter is a massage therapist and she tried massaging the muscle without much improvement. Denies any anterior or medial hip pain  Past Medical History:  Diagnosis Date  . Asthma    childhood  . Cervicogenic headache 08/20/2015   Left side  . Chicken pox   . Depression   . Family history of colon cancer   . Family history of ovarian cancer   . Family history of pancreatic cancer   . Family history of stomach cancer   . Family history of uterine cancer   . Hypertension   . Seizures ([place])    STARTED 21/2 YEARS AGO   Past Surgical History:  Procedure Laterality Date  . ABDOMINAL HYSTERECTOMY  1989   partial, menorrhagia  . CATARACT EXTRACTION Bilateral 10/26/2015, 12-04/2015   laser    reports that she has quit smoking. she has never used smokeless tobacco. She reports that she does not drink alcohol or use drugs. family history includes Asthma in her other; Cancer in her brother, brother, maternal aunt, maternal aunt, and mother; Colon cancer in her brother, brother, cousin, and other; Hyperlipidemia in her other; Other in her sister; Uterine cancer in her other. No Known Allergies   Review of Systems  Neurological: Negative for weakness and numbness.  Hematological: Negative for adenopathy.       Objective:   Physical Exam  Constitutional: She appears well-developed and well-nourished.  Cardiovascular: Normal  rate and regular rhythm.  Pulmonary/Chest: Effort normal and breath sounds normal. No respiratory distress. She has no wheezes. She has no rales.  Musculoskeletal:  Full range of motion right hip.. She has mild tenderness proximal iliotibial region. No tenderness over the greater trochanteric bursa. Straight leg raise are negative. Full-strength lower extremity. Symmetric reflexes.       Assessment:     Muscle strain right hip    Plan:     -Meloxicam 15 mg once daily -Gentle stretches as tolerated -Resume regular aerobic exercise as tolerated -Consider sports referral if no improvement in 2-3 weeks  Glenda Post MD [facility]

## 2017-12-13 NOTE — Patient Instructions (Signed)
Try some warm sitz baths once daily Gentle stretches as tolerated Touch base in 2-3 weeks if no better Take the Meloxicam with food.

## 2018-01-08 ENCOUNTER — Other Ambulatory Visit: Payer: Self-pay | Admitting: Family Medicine

## 2018-01-25 ENCOUNTER — Telehealth: Payer: Self-pay | Admitting: Neurology

## 2018-01-25 ENCOUNTER — Ambulatory Visit: Payer: BLUE CROSS/BLUE SHIELD | Admitting: Neurology

## 2018-01-25 MED ORDER — LEVETIRACETAM 500 MG PO TABS: 1000.0000 mg | ORAL_TABLET | Freq: Two times a day (BID) | ORAL | 0 refills | Status: DC

## 2018-01-25 MED ORDER — DIVALPROEX SODIUM 500 MG PO DR TAB: DELAYED_RELEASE_TABLET | ORAL | 0 refills | Status: DC

## 2018-01-25 NOTE — Telephone Encounter (Signed)
Pt was 23 min late for apt, will be travelling and needs call back from nurse about medications. Of note, she would not take apt that was available in March. Best call back is [phone].

## 2018-01-25 NOTE — Telephone Encounter (Signed)
Pt requesting refills be sent to [facility], [place]

## 2018-01-25 NOTE — Telephone Encounter (Signed)
I called pt to r/s her appt she missed today. She states she got lost on way to appt and was late. She will be out of the country starting tomorrow for a month. She will return around the second week of April. I made appt for 03/12/18 at 12pm, check in 1130am to see Dr. Jannifer Franklin.   Advised we will send prescription refills to her pharmacy as requested. E-scribed.   [facility] to [facility] rx's sent by  MD since pt wants them to go to local pharmacy. I spoke with Cassandra. She states pt has no file with them. Rx's will be voided.

## 2018-01-25 NOTE — Addendum Note (Signed)
Addended by: Hope Pigeon on: 01/25/2018 09:21 AM   Modules accepted: Orders

## 2018-01-25 NOTE — Telephone Encounter (Signed)
I called the patient, left a message.  She missed her appointment today.  I assume that she is calling because she needs refills on her medications, I will send in refills for the Keppra and Depakote, she needs to schedule another revisit.  If she is needs something else, she is to call us back.

## 2018-01-25 NOTE — Telephone Encounter (Signed)
This patient did not show for a revisit appointment today. 

## 2018-01-26 ENCOUNTER — Encounter: Payer: Self-pay | Admitting: Neurology

## 2018-02-01 ENCOUNTER — Ambulatory Visit: Payer: BLUE CROSS/BLUE SHIELD | Admitting: Nurse Practitioner

## 2018-02-02 ENCOUNTER — Other Ambulatory Visit: Payer: Self-pay | Admitting: Family Medicine

## 2018-02-02 ENCOUNTER — Other Ambulatory Visit: Payer: Self-pay | Admitting: Neurology

## 2018-03-12 ENCOUNTER — Ambulatory Visit (INDEPENDENT_AMBULATORY_CARE_PROVIDER_SITE_OTHER): Payer: Medicare Other | Admitting: Neurology

## 2018-03-12 ENCOUNTER — Encounter: Payer: Self-pay | Admitting: Neurology

## 2018-03-12 VITALS — BP 155/69 | HR 64 | Ht 60.0 in | Wt 140.0 lb

## 2018-03-12 DIAGNOSIS — Z5181 Encounter for therapeutic drug level monitoring: Secondary | ICD-10-CM | POA: Diagnosis not present

## 2018-03-12 DIAGNOSIS — G40409 Other generalized epilepsy and epileptic syndromes, not intractable, without status epilepticus: Secondary | ICD-10-CM | POA: Diagnosis not present

## 2018-03-12 MED ORDER — DIVALPROEX SODIUM 500 MG PO DR TAB: DELAYED_RELEASE_TABLET | ORAL | 3 refills | Status: DC

## 2018-03-12 MED ORDER — LEVETIRACETAM 500 MG PO TABS: 1000.0000 mg | ORAL_TABLET | Freq: Two times a day (BID) | ORAL | 3 refills | Status: DC

## 2018-03-12 NOTE — Progress Notes (Signed)
Reason for visit: Seizures  Glenda Francis is an 67 y.o. female  History of present illness:  Glenda Francis is a 67 year old right-handed Hispanic female with a history of seizures that have been well controlled with use of Depakote and Keppra.  The patient is able to operate a motor vehicle without difficulty.  She has not had any seizures since last seen.  She tolerates her medications well.  She does have some left-sided neck discomfort that is not extremely bothersome for her, she does have good days and bad days with the discomfort.  She returns for an evaluation.  Past Medical History:  Diagnosis Date  . Asthma    childhood  . Cervicogenic headache 08/20/2015   Left side  . Chicken pox   . Depression   . Family history of colon cancer   . Family history of ovarian cancer   . Family history of pancreatic cancer   . Family history of stomach cancer   . Family history of uterine cancer   . Hypertension   . Seizures ([place])    STARTED 21/2 YEARS AGO    Past Surgical History:  Procedure Laterality Date  . ABDOMINAL HYSTERECTOMY  1989   partial, menorrhagia  . CATARACT EXTRACTION Bilateral 10/26/2015, 12-04/2015   laser    Family History  Problem Relation Age of Onset  . Cancer Mother        uterine,ovarian  . Cancer Brother        colon-PASSEDA WAY 54, pancreatic  . Colon cancer Brother   . Cancer Brother        colon,stomach,brain  . Colon cancer Brother   . Other Sister        Pos for Lynch syndrome  . Colon cancer Other        died at 40  . Asthma Other   . Hyperlipidemia Other   . Cancer Maternal Aunt        uterine and ovarian  . Cancer Maternal Aunt        ovarian and uterine  . Uterine cancer Other        died at 31  . Colon cancer Cousin        6 maternal cousins with colon cancer    Social history:  reports that she has quit smoking. She has never used smokeless tobacco. She reports that she does not drink alcohol or use drugs.   No Known  Allergies  Medications:  Prior to Admission medications   Medication Sig Start Date End Date Taking? Authorizing Provider  Calcium Carbonate-Vitamin D (CALCIUM + D PO) Take 1 capsule by mouth daily.   Yes [provider]  divalproex (DEPAKOTE) 500 MG DR tablet TAKE 1 TABLET (500 MG TOTAL) BY MOUTH 2 (TWO) TIMES DAILY. 03/12/18  Yes Kathrynn Ducking, MD  levETIRAcetam (KEPPRA) 500 MG tablet Take 2 tablets (1,000 mg total) by mouth every 12 (twelve) hours. 03/12/18  Yes Kathrynn Ducking, MD  losartan (COZAAR) 100 MG tablet TAKE 1 TABLET BY MOUTH EVERY DAY 02/02/18  Yes Burchette, Alinda Sierras, MD  meloxicam (MOBIC) 15 MG tablet TAKE 1 TABLET BY MOUTH DAILY 02/02/18  Yes Burchette, Alinda Sierras, MD  Multiple Vitamins-Minerals (MULTIVITAMIN PO) Take 1 capsule by mouth daily.   Yes [provider]  Polyvinyl Alcohol-Povidone (REFRESH OP) Apply 1 drop to eye 2 (two) times daily as needed.    Yes [provider]  triamcinolone cream (KENALOG) 0.1 % Apply 1 application topically 2 (  two) times daily as needed. 03/24/17  Yes Burchette, Alinda Sierras, MD    ROS:  Out of a complete 14 system review of symptoms, the patient complains only of the following symptoms, and all other reviewed systems are negative.  Ringing in the ears, runny nose Eye redness Frequency of urination Neck pain  Blood pressure (!) 155/69, pulse 64, height 5' (1.524 m), weight 140 lb (63.5 kg).  Physical Exam  General: The patient is alert and cooperative at the time of the examination.  Skin: No significant peripheral edema is noted.   Neurologic Exam  Mental status: The patient is alert and oriented x 3 at the time of the examination. The patient has apparent normal recent and remote memory, with an apparently normal attention span and concentration ability.   Cranial nerves: Facial symmetry is present. Speech is normal, no aphasia or dysarthria is noted. Extraocular movements are full. Visual fields are  full.  Motor: The patient has good strength in all 4 extremities.  Sensory examination: Soft touch sensation is symmetric on the face, arms, and legs.  Coordination: The patient has good finger-nose-finger and heel-to-shin bilaterally.  Gait and station: The patient has a normal gait. Tandem gait is normal. Romberg is negative. No drift is seen.  Reflexes: Deep tendon reflexes are symmetric.   Assessment/Plan:  1.  History of seizures, well controlled  2.  Neck pain, cervicogenic headache  Overall, the patient is doing very well.  She will have blood work done today, a prescription was sent in for her Depakote and for her Keppra.  She will follow-up in 1 year if she is doing well.  Jill Alexanders MD 03/12/2018 12:17 PM  [facility] [address] [place], [place] [address]  Phone [phone] Fax [phone]

## 2018-03-14 ENCOUNTER — Telehealth: Payer: Self-pay | Admitting: Neurology

## 2018-03-14 ENCOUNTER — Telehealth: Payer: Self-pay | Admitting: *Deleted

## 2018-03-14 LAB — COMPREHENSIVE METABOLIC PANEL
ALBUMIN: 4.1 g/dL (ref 3.6–4.8)
ALT: 11 IU/L (ref 0–32)
AST: 14 IU/L (ref 0–40)
Albumin/Globulin Ratio: 1.6 (ref 1.2–2.2)
Alkaline Phosphatase: 55 IU/L (ref 39–117)
BUN / CREAT RATIO: 23 (ref 12–28)
BUN: 13 mg/dL (ref 8–27)
CO2: 27 mmol/L (ref 20–29)
CREATININE: 0.57 mg/dL (ref 0.57–1.00)
Calcium: 9.7 mg/dL (ref 8.7–10.3)
Chloride: 97 mmol/L (ref 96–106)
GFR calc non Af Amer: 98 mL/min/{1.73_m2} (ref >59)
GFR, EST AFRICAN AMERICAN: 113 mL/min/{1.73_m2} (ref >59)
GLOBULIN, TOTAL: 2.6 g/dL (ref 1.5–4.5)
GLUCOSE: 139 mg/dL — AB (ref 65–99)
Potassium: 4.7 mmol/L (ref 3.5–5.2)
Sodium: 137 mmol/L (ref 134–144)
TOTAL PROTEIN: 6.7 g/dL (ref 6.0–8.5)

## 2018-03-14 LAB — CBC WITH DIFFERENTIAL/PLATELET
BASOS: 1 %
Basophils Absolute: 0 10*3/uL (ref 0.0–0.2)
EOS (ABSOLUTE): 0.2 10*3/uL (ref 0.0–0.4)
EOS: 4 %
HEMATOCRIT: 36.8 % (ref 34.0–46.6)
HEMOGLOBIN: 12.2 g/dL (ref 11.1–15.9)
Immature Grans (Abs): 0.1 10*3/uL (ref 0.0–0.1)
Immature Granulocytes: 2 %
LYMPHS ABS: 2.7 10*3/uL (ref 0.7–3.1)
Lymphs: 47 %
MCH: 30 pg (ref 26.6–33.0)
MCHC: 33.2 g/dL (ref 31.5–35.7)
MCV: 91 fL (ref 79–97)
MONOCYTES: 10 %
Monocytes Absolute: 0.6 10*3/uL (ref 0.1–0.9)
NEUTROS ABS: 2 10*3/uL (ref 1.4–7.0)
Neutrophils: 36 %
Platelets: 265 10*3/uL (ref 150–379)
RBC: 4.06 x10E6/uL (ref 3.77–5.28)
RDW: 13.1 % (ref 12.3–15.4)
WBC: 5.6 10*3/uL (ref 3.4–10.8)

## 2018-03-14 LAB — LEVETIRACETAM LEVEL: Levetiracetam Lvl: 31.7 ug/mL (ref 10.0–40.0)

## 2018-03-14 LAB — VALPROIC ACID LEVEL: VALPROIC ACID LVL: 55 ug/mL (ref 50–100)

## 2018-03-14 NOTE — Telephone Encounter (Signed)
-----   Message from Kathrynn Ducking, MD sent at 03/14/2018  1:52 PM EDT -----  Blood work is unremarkable, therapeutic levels of Keppra and Depakote, no change in dosing. Please call the patient. ----- Message ----- From: Lavone Neri Lab Results In Sent: 03/13/2018   7:41 AM To: Kathrynn Ducking, MD

## 2018-03-14 NOTE — Telephone Encounter (Signed)
Pt returning RNs call, aware that labs were normal. Pt didn't have any questions at this time.

## 2018-03-14 NOTE — Telephone Encounter (Signed)
Called, LVM for pt advising her labs unremarkable per CW,MD. Keppra and Depakote levels in therapeutic range, no change in dosing. Gave [facility] phone number if she has further questions/concerns.

## 2018-03-14 NOTE — Telephone Encounter (Signed)
Noted  

## 2018-03-14 NOTE — Telephone Encounter (Signed)
Called the patient to review lab work. There was no answer. I left a voicemail for the pt informing her that her lab work was with in the therapeutic range and there would be no changing in the dosage of her medications. Encouraged the pt to call back if she had any questions.

## 2018-03-21 ENCOUNTER — Ambulatory Visit (INDEPENDENT_AMBULATORY_CARE_PROVIDER_SITE_OTHER): Payer: Medicare Other | Admitting: Internal Medicine

## 2018-03-21 ENCOUNTER — Encounter: Payer: Self-pay | Admitting: Internal Medicine

## 2018-03-21 VITALS — BP 118/76 | HR 87 | Temp 97.9°F | Resp 16 | Ht 60.0 in | Wt 139.4 lb

## 2018-03-21 DIAGNOSIS — R300 Dysuria: Secondary | ICD-10-CM

## 2018-03-21 DIAGNOSIS — N3001 Acute cystitis with hematuria: Secondary | ICD-10-CM | POA: Diagnosis not present

## 2018-03-21 LAB — POCT URINALYSIS DIPSTICK
BILIRUBIN UA: NEGATIVE
Glucose, UA: NEGATIVE
KETONES UA: NEGATIVE
Nitrite, UA: NEGATIVE
PH UA: 6.5 (ref 5.0–8.0)
SPEC GRAV UA: 1.015 (ref 1.010–1.025)
UROBILINOGEN UA: NEGATIVE U/dL — AB

## 2018-03-21 MED ORDER — NITROFURANTOIN MONOHYD MACRO 100 MG PO CAPS: 100.0000 mg | ORAL_CAPSULE | Freq: Two times a day (BID) | ORAL | 0 refills | Status: AC

## 2018-03-21 NOTE — Patient Instructions (Signed)
This  Is a uti   Urine culture pending  Begin on antibiotic     In the interim   Will be contacted   When results are back      Urinary Tract Infection, Adult A urinary tract infection (UTI) is an infection of any part of the urinary tract, which includes the kidneys, ureters, bladder, and urethra. These organs make, store, and get rid of urine in the body. UTI can be a bladder infection (cystitis) or kidney infection (pyelonephritis). What are the causes? This infection may be caused by fungi, viruses, or bacteria. Bacteria are the most common cause of UTIs. This condition can also be caused by repeated incomplete emptying of the bladder during urination. What increases the risk? This condition is more likely to develop if:  You ignore your need to urinate or hold urine for long periods of time.  You do not empty your bladder completely during urination.  You wipe back to front after urinating or having a bowel movement, if you are female.  You are uncircumcised, if you are female.  You are constipated.  You have a urinary catheter that stays in place (indwelling).  You have a weak defense (immune) system.  You have a medical condition that affects your bowels, kidneys, or bladder.  You have diabetes.  You take antibiotic medicines frequently or for long periods of time, and the antibiotics no longer work well against certain types of infections (antibiotic resistance).  You take medicines that irritate your urinary tract.  You are exposed to chemicals that irritate your urinary tract.  You are female.  What are the signs or symptoms? Symptoms of this condition include:  Fever.  Frequent urination or passing small amounts of urine frequently.  Needing to urinate urgently.  Pain or burning with urination.  Urine that smells bad or unusual.  Cloudy urine.  Pain in the lower abdomen or back.  Trouble urinating.  Blood in the urine.  Vomiting or being less  hungry than normal.  Diarrhea or abdominal pain.  Vaginal discharge, if you are female.  How is this diagnosed? This condition is diagnosed with a medical history and physical exam. You will also need to provide a urine sample to test your urine. Other tests may be done, including:  Blood tests.  Sexually transmitted disease (STD) testing.  If you have had more than one UTI, a cystoscopy or imaging studies may be done to determine the cause of the infections. How is this treated? Treatment for this condition often includes a combination of two or more of the following:  Antibiotic medicine.  Other medicines to treat less common causes of UTI.  Over-the-counter medicines to treat pain.  Drinking enough water to stay hydrated.  Follow these instructions at home:  Take over-the-counter and prescription medicines only as told by your health care provider.  If you were prescribed an antibiotic, take it as told by your health care provider. Do not stop taking the antibiotic even if you start to feel better.  Avoid alcohol, caffeine, tea, and carbonated beverages. They can irritate your bladder.  Drink enough fluid to keep your urine clear or pale yellow.  Keep all follow-up visits as told by your health care provider. This is important.  Make sure to: ? Empty your bladder often and completely. Do not hold urine for long periods of time. ? Empty your bladder before and after sex. ? Wipe from front to back after a bowel movement if you  are female. Use each tissue one time when you wipe. Contact a health care provider if:  You have back pain.  You have a fever.  You feel nauseous or vomit.  Your symptoms do not get better after 3 days.  Your symptoms go away and then return. Get help right away if:  You have severe back pain or lower abdominal pain.  You are vomiting and cannot keep down any medicines or water. This information is not intended to replace advice given  to you by your health care provider. Make sure you discuss any questions you have with your health care provider. Document Released: 08/24/2005 Document Revised: 04/27/2016 Document Reviewed: 10/05/2015 Elsevier Interactive Patient Education  [employer].

## 2018-03-21 NOTE — Progress Notes (Signed)
Chief Complaint  Patient presents with  . Dysuria    sx started yesterday  . Urinary Frequency  . Lower abdomninal pain    HPI: Glenda Francis 67 y.o.   SDA  PCP  appt NA Concern for UtI  Once day of pain and frequent    And lowre abd pain .  For  Dysuria  And back  Given monistat 7 from  Work .   Took ibuprofen Seen 1-2 days   lbp but  fever  nvd    Ha some mild up congestion ? Allergy ?   dysuria  Nov 18  Basically negative  But rx   NG   rx with cipro in past Remote hx  Enterococcus  uti 2017  ROS: See pertinent positives and negatives per HPI. Fever VD .   Past Medical History:  Diagnosis Date  . Asthma    childhood  . Cervicogenic headache 08/20/2015   Left side  . Chicken pox   . Depression   . Family history of colon cancer   . Family history of ovarian cancer   . Family history of pancreatic cancer   . Family history of stomach cancer   . Family history of uterine cancer   . Hypertension   . Seizures ([place])    STARTED 21/2 YEARS AGO    Family History  Problem Relation Age of Onset  . Cancer Mother        uterine,ovarian  . Cancer Brother        colon-PASSEDA WAY 54, pancreatic  . Colon cancer Brother   . Cancer Brother        colon,stomach,brain  . Colon cancer Brother   . Other Sister        Pos for Lynch syndrome  . Colon cancer Other        died at 97  . Asthma Other   . Hyperlipidemia Other   . Cancer Maternal Aunt        uterine and ovarian  . Cancer Maternal Aunt        ovarian and uterine  . Uterine cancer Other        died at 60  . Colon cancer Cousin        6 maternal cousins with colon cancer    Social History   Socioeconomic History  . Marital status: Divorced    Spouse name: Not on file  . Number of children: 3  . Years of education: college  . Highest education level: Not on file  Occupational History  . Occupation: retired  Scientific laboratory technician  . Financial resource strain: Not on file  . Food insecurity:    Worry: Not on file     Inability: Not on file  . Transportation needs:    Medical: Not on file    Non-medical: Not on file  Tobacco Use  . Smoking status: Former Research scientist (life sciences)  . Smokeless tobacco: Never Used  Substance and Sexual Activity  . Alcohol use: No    Comment: 1 GLASS WINE WITH DINNER  . Drug use: No  . Sexual activity: Not Currently  Lifestyle  . Physical activity:    Days per week: Not on file    Minutes per session: Not on file  . Stress: Not on file  Relationships  . Social connections:    Talks on phone: Not on file    Gets together: Not on file    Attends religious service: Not on file  Active member of club or organization: Not on file    Attends meetings of clubs or organizations: Not on file    Relationship status: Not on file  Other Topics Concern  . Not on file  Social History Narrative   Patient is divorced   Patient  has 3 children.   Patient is retired   Patient has some college.   Patient is currently doing yoga and water aerobics.   Patient drinks 1-2 cups of caffeine daily.   Patient is right handed.     Outpatient Medications Prior to Visit  Medication Sig Dispense Refill  . Calcium Carbonate-Vitamin D (CALCIUM + D PO) Take 1 capsule by mouth daily.    . divalproex (DEPAKOTE) 500 MG DR tablet TAKE 1 TABLET (500 MG TOTAL) BY MOUTH 2 (TWO) TIMES DAILY. 180 tablet 3  . levETIRAcetam (KEPPRA) 500 MG tablet Take 2 tablets (1,000 mg total) by mouth every 12 (twelve) hours. 360 tablet 3  . losartan (COZAAR) 100 MG tablet TAKE 1 TABLET BY MOUTH EVERY DAY 90 tablet 0  . meloxicam (MOBIC) 15 MG tablet TAKE 1 TABLET BY MOUTH DAILY (Patient taking differently: TAKE 1 TABLET BY MOUTH as needed) 30 tablet 0  . Multiple Vitamins-Minerals (MULTIVITAMIN PO) Take 1 capsule by mouth daily.    . Polyvinyl Alcohol-Povidone (REFRESH OP) Apply 1 drop to eye 2 (two) times daily as needed.     . triamcinolone cream (KENALOG) 0.1 % Apply 1 application topically 2 (two) times daily as needed.  30 g 3   No facility-administered medications prior to visit.      EXAM:  BP 118/76   Pulse 87   Temp 97.9 F (36.6 C)   Resp 16   Ht 5' (1.524 m)   Wt 139 lb 6 oz (63.2 kg)   SpO2 97%   BMI 27.22 kg/m   Body mass index is 27.22 kg/m.  GENERAL: vitals reviewed and listed above, alert, oriented, appears well hydrated and in no acute distress HEENT: atraumatic, conjunctiva  clear, no obvious abnormalities on inspection of external nose and ears NECK: no obvious masses on inspection palpation  CV: HRRR, no clubbing cyanosis or  peripheral edema nl cap refill  Abdomen:  Sof,t normal bowel sounds without hepatosplenomegaly,  Mild lower abd discomfort no guarding rebound or masses no CVA tenderness MS: moves all extremities without noticeable focal  abnormality PSYCH: pleasant and cooperative, no obvious depression or anxiety ua 3+ l 3+ bl 1+ prot ASSESSMENT AND PLAN:  Discussed the following assessment and plan:  Dysuria - Plan: POC Urinalysis Dipstick, Urine Culture  Acute cystitis with hematuria uti most likely acute cystitis     Begin macrobid and ur cx pending   Results     Expectant management. And for alarm sx  Etc  -Patient advised to return or notify health care team  if symptoms worsen ,persist or new concerns arise.  Patient Instructions  This  Is a uti   Urine culture pending  Begin on antibiotic     In the interim   Will be contacted   When results are back      Urinary Tract Infection, Adult A urinary tract infection (UTI) is an infection of any part of the urinary tract, which includes the kidneys, ureters, bladder, and urethra. These organs make, store, and get rid of urine in the body. UTI can be a bladder infection (cystitis) or kidney infection (pyelonephritis). What are the causes? This infection may be caused  by fungi, viruses, or bacteria. Bacteria are the most common cause of UTIs. This condition can also be caused by repeated incomplete emptying of  the bladder during urination. What increases the risk? This condition is more likely to develop if:  You ignore your need to urinate or hold urine for long periods of time.  You do not empty your bladder completely during urination.  You wipe back to front after urinating or having a bowel movement, if you are female.  You are uncircumcised, if you are female.  You are constipated.  You have a urinary catheter that stays in place (indwelling).  You have a weak defense (immune) system.  You have a medical condition that affects your bowels, kidneys, or bladder.  You have diabetes.  You take antibiotic medicines frequently or for long periods of time, and the antibiotics no longer work well against certain types of infections (antibiotic resistance).  You take medicines that irritate your urinary tract.  You are exposed to chemicals that irritate your urinary tract.  You are female.  What are the signs or symptoms? Symptoms of this condition include:  Fever.  Frequent urination or passing small amounts of urine frequently.  Needing to urinate urgently.  Pain or burning with urination.  Urine that smells bad or unusual.  Cloudy urine.  Pain in the lower abdomen or back.  Trouble urinating.  Blood in the urine.  Vomiting or being less hungry than normal.  Diarrhea or abdominal pain.  Vaginal discharge, if you are female.  How is this diagnosed? This condition is diagnosed with a medical history and physical exam. You will also need to provide a urine sample to test your urine. Other tests may be done, including:  Blood tests.  Sexually transmitted disease (STD) testing.  If you have had more than one UTI, a cystoscopy or imaging studies may be done to determine the cause of the infections. How is this treated? Treatment for this condition often includes a combination of two or more of the following:  Antibiotic medicine.  Other medicines to treat less  common causes of UTI.  Over-the-counter medicines to treat pain.  Drinking enough water to stay hydrated.  Follow these instructions at home:  Take over-the-counter and prescription medicines only as told by your health care provider.  If you were prescribed an antibiotic, take it as told by your health care provider. Do not stop taking the antibiotic even if you start to feel better.  Avoid alcohol, caffeine, tea, and carbonated beverages. They can irritate your bladder.  Drink enough fluid to keep your urine clear or pale yellow.  Keep all follow-up visits as told by your health care provider. This is important.  Make sure to: ? Empty your bladder often and completely. Do not hold urine for long periods of time. ? Empty your bladder before and after sex. ? Wipe from front to back after a bowel movement if you are female. Use each tissue one time when you wipe. Contact a health care provider if:  You have back pain.  You have a fever.  You feel nauseous or vomit.  Your symptoms do not get better after 3 days.  Your symptoms go away and then return. Get help right away if:  You have severe back pain or lower abdominal pain.  You are vomiting and cannot keep down any medicines or water. This information is not intended to replace advice given to you by your health care provider. Make sure  you discuss any questions you have with your health care provider. Document Released: 08/24/2005 Document Revised: 04/27/2016 Document Reviewed: 10/05/2015 Elsevier Interactive Patient Education  2018 [place]. Jasiya Markie M.D.

## 2018-03-23 LAB — URINE CULTURE
MICRO NUMBER:: 90501382
SPECIMEN QUALITY:: ADEQUATE

## 2018-03-23 NOTE — Progress Notes (Signed)
Tell patient that urine culture shows e coli  sensitive to medication given . Should resolve with current treatment .FU if not better. 

## 2018-05-11 ENCOUNTER — Other Ambulatory Visit: Payer: Self-pay

## 2018-05-11 MED ORDER — TRIAMCINOLONE ACETONIDE 0.1 % EX CREA: 1.0000 "application " | TOPICAL_CREAM | Freq: Two times a day (BID) | CUTANEOUS | 0 refills | Status: DC | PRN

## 2018-05-18 ENCOUNTER — Encounter: Payer: Self-pay | Admitting: Internal Medicine

## 2018-05-18 DIAGNOSIS — Z01419 Encounter for gynecological examination (general) (routine) without abnormal findings: Secondary | ICD-10-CM | POA: Diagnosis not present

## 2018-05-18 DIAGNOSIS — Z1231 Encounter for screening mammogram for malignant neoplasm of breast: Secondary | ICD-10-CM | POA: Diagnosis not present

## 2018-05-18 DIAGNOSIS — Z6828 Body mass index (BMI) 28.0-28.9, adult: Secondary | ICD-10-CM | POA: Diagnosis not present

## 2018-05-18 DIAGNOSIS — M8588 Other specified disorders of bone density and structure, other site: Secondary | ICD-10-CM | POA: Diagnosis not present

## 2018-05-18 DIAGNOSIS — N958 Other specified menopausal and perimenopausal disorders: Secondary | ICD-10-CM | POA: Diagnosis not present

## 2018-05-24 ENCOUNTER — Encounter: Payer: Self-pay | Admitting: Internal Medicine

## 2018-07-20 ENCOUNTER — Ambulatory Visit (AMBULATORY_SURGERY_CENTER): Payer: Self-pay

## 2018-07-20 VITALS — Ht 59.5 in | Wt 140.8 lb

## 2018-07-20 DIAGNOSIS — Z800 Family history of malignant neoplasm of digestive organs: Secondary | ICD-10-CM

## 2018-07-20 MED ORDER — NA SULFATE-K SULFATE-MG SULF 17.5-3.13-1.6 GM/177ML PO SOLN: 1.0000 | Freq: Once | ORAL | 0 refills | Status: AC

## 2018-07-20 NOTE — Progress Notes (Signed)
Per pt, no allergies to soy or egg products.Pt not taking any weight loss meds or using  O2 at home.  Pt refused emmi video. 

## 2018-07-26 ENCOUNTER — Ambulatory Visit: Payer: Medicare Other | Admitting: Family Medicine

## 2018-07-26 ENCOUNTER — Encounter: Payer: Self-pay | Admitting: Family Medicine

## 2018-07-26 ENCOUNTER — Ambulatory Visit (INDEPENDENT_AMBULATORY_CARE_PROVIDER_SITE_OTHER): Payer: Medicare Other | Admitting: Family Medicine

## 2018-07-26 VITALS — BP 130/90 | HR 78 | Temp 97.9°F | Wt 139.6 lb

## 2018-07-26 DIAGNOSIS — R1032 Left lower quadrant pain: Secondary | ICD-10-CM | POA: Diagnosis not present

## 2018-07-26 DIAGNOSIS — M545 Low back pain, unspecified: Secondary | ICD-10-CM

## 2018-07-26 DIAGNOSIS — R1030 Lower abdominal pain, unspecified: Secondary | ICD-10-CM | POA: Diagnosis not present

## 2018-07-26 LAB — POCT URINALYSIS DIPSTICK
BILIRUBIN UA: NEGATIVE
Glucose, UA: NEGATIVE
KETONES UA: NEGATIVE
Nitrite, UA: NEGATIVE
PH UA: 7 (ref 5.0–8.0)
Protein, UA: NEGATIVE
Spec Grav, UA: 1.015 (ref 1.010–1.025)
UROBILINOGEN UA: 0.2 U/dL

## 2018-07-26 MED ORDER — CIPROFLOXACIN HCL 500 MG PO TABS: 500.0000 mg | ORAL_TABLET | Freq: Two times a day (BID) | ORAL | 0 refills | Status: DC

## 2018-07-26 MED ORDER — METRONIDAZOLE 500 MG PO TABS: 500.0000 mg | ORAL_TABLET | Freq: Three times a day (TID) | ORAL | 0 refills | Status: DC

## 2018-07-26 NOTE — Progress Notes (Signed)
Subjective:     Patient ID: Glenda Francis, female   DOB: 02-May-1951, 67 y.o.   MRN: [id number]  HPI Patient seen with chief complaint of left lower quadrant abdominal pain with some radiation toward the back region. Onset around this past Sunday. Symptoms have been somewhat intermittent. She denies any dysuria. No fevers or chills. Occasional slight nausea. No vomiting. Occasional loose stools. No bloody stools.  Pain is mild to moderate. Not related to position No alleviating or exacerbating factors.  No radiculitis symptoms. No flank pain  Denies history of kidney stones. She had colonoscopy 2014 which showed diffuse diverticulosis involving ascending, transverse, descending, and sigmoid colon. No prior history of diverticulitis. She has scheduled colonoscopy next week.  Past Medical History:  Diagnosis Date  . Anemia    as a child  . Asthma    childhood  . Cervicogenic headache 08/20/2015   Left side  . Chicken pox   . Depression    when son passed away  . Family history of colon cancer   . Family history of ovarian cancer   . Family history of pancreatic cancer   . Family history of stomach cancer   . Family history of uterine cancer   . Hypertension   . Seizures ([place]) 1   started in 07/2008/ last seizure was in 2012   Past Surgical History:  Procedure Laterality Date  . ABDOMINAL HYSTERECTOMY  1989   partial, menorrhagia  . CATARACT EXTRACTION Bilateral 10/26/2015, 12-04/2015   laser  . COLONOSCOPY      reports that she has quit smoking. She has never used smokeless tobacco. She reports that she drinks about 7.0 standard drinks of alcohol per week. She reports that she does not use drugs. family history includes Asthma in her other; Cancer in her brother, brother, maternal aunt, maternal aunt, and mother; Colon cancer in her brother, brother, cousin, and other; Hyperlipidemia in her other; Other in her sister; Ovarian cancer in her maternal aunt, maternal aunt, mother, and  other; Pancreatic cancer in her brother; Uterine cancer in her maternal aunt, maternal aunt, mother, and other. No Known Allergies   Review of Systems  Constitutional: Negative for chills and fever.  Respiratory: Negative for cough and shortness of breath.   Cardiovascular: Negative for chest pain.  Gastrointestinal: Positive for abdominal pain. Negative for abdominal distention, blood in stool, constipation and vomiting.  Genitourinary: Negative for difficulty urinating, dysuria, flank pain and hematuria.  Neurological: Negative for weakness and numbness.       Objective:   Physical Exam  Constitutional: She appears well-developed and well-nourished.  Cardiovascular: Normal rate and regular rhythm.  Pulmonary/Chest: Effort normal and breath sounds normal.  Abdominal: Soft. Normal appearance and bowel sounds are normal. There is no hepatosplenomegaly. There is no rigidity, no rebound and no guarding.  She has some tenderness to palpation left lower quadrant. No guarding or rebound.  Skin: No rash noted.       Assessment:     Abdominal pain left lower quadrant. Suspect possible acute diverticulitis. She does have small leukocytes and trace blood on urine dipstick but doubt related to urinary tract. Non-acute abdomen    Plan:     -urine culture sent -Start Cipro 500 mg twice a day for 10 days and Flagyl 500 mg 3 times a day for 10 days -Follow-up immediately for any fever, vomiting, or worsening abdominal pain.  Touch base if pain not improving in a couple of days  Eulas Post MD  [facility]

## 2018-07-26 NOTE — Patient Instructions (Signed)
Diverticulitis °Diverticulitis is infection or inflammation of small pouches (diverticula) in the colon that form due to a condition called diverticulosis. Diverticula can trap stool (feces) and bacteria, causing infection and inflammation. °Diverticulitis may cause severe stomach pain and diarrhea. It may lead to tissue damage in the colon that causes bleeding. The diverticula may also burst (rupture) and cause infected stool to enter other areas of the abdomen. °Complications of diverticulitis can include: °· Bleeding. °· Severe infection. °· Severe pain. °· Rupture (perforation) of the colon. °· Blockage (obstruction) of the colon. ° °What are the causes? °This condition is caused by stool becoming trapped in the diverticula, which allows bacteria to grow in the diverticula. This leads to inflammation and infection. °What increases the risk? °You are more likely to develop this condition if: °· You have diverticulosis. The risk for diverticulosis increases if: °? You are overweight or obese. °? You use tobacco products. °? You do not get enough exercise. °· You eat a diet that does not include enough fiber. High-fiber foods include fruits, vegetables, beans, nuts, and whole grains. ° °What are the signs or symptoms? °Symptoms of this condition may include: °· Pain and tenderness in the abdomen. The pain is normally located on the left side of the abdomen, but it may occur in other areas. °· Fever and chills. °· Bloating. °· Cramping. °· Nausea. °· Vomiting. °· Changes in bowel routines. °· Blood in your stool. ° °How is this diagnosed? °This condition is diagnosed based on: °· Your medical history. °· A physical exam. °· Tests to make sure there is nothing else causing your condition. These tests may include: °? Blood tests. °? Urine tests. °? Imaging tests of the abdomen, including X-rays, ultrasounds, MRIs, or CT scans. ° °How is this treated? °Most cases of this condition are mild and can be treated at home.  Treatment may include: °· Taking over-the-counter pain medicines. °· Following a clear liquid diet. °· Taking antibiotic medicines by mouth. °· Rest. ° °More severe cases may need to be treated at a hospital. Treatment may include: °· Not eating or drinking. °· Taking prescription pain medicine. °· Receiving antibiotic medicines through an IV tube. °· Receiving fluids and nutrition through an IV tube. °· Surgery. ° °When your condition is under control, your health care provider may recommend that you have a colonoscopy. This is an exam to look at the entire large intestine. During the exam, a lubricated, bendable tube is inserted into the anus and then passed into the rectum, colon, and other parts of the large intestine. A colonoscopy can show how severe your diverticula are and whether something else may be causing your symptoms. °Follow these instructions at home: °Medicines °· Take over-the-counter and prescription medicines only as told by your health care provider. These include fiber supplements, probiotics, and stool softeners. °· If you were prescribed an antibiotic medicine, take it as told by your health care provider. Do not stop taking the antibiotic even if you start to feel better. °· Do not drive or use heavy machinery while taking prescription pain medicine. °General instructions °· Follow a full liquid diet or another diet as directed by your health care provider. After your symptoms improve, your health care provider may tell you to change your diet. He or she may recommend that you eat a diet that contains at least 25 g (25 grams) of fiber daily. Fiber makes it easier to pass stool. Healthy sources of fiber include: °? Berries. One cup   contains 4-8 grams of fiber. °? Beans or lentils. One half cup contains 5-8 grams of fiber. °? Green vegetables. One cup contains 4 grams of fiber. °· Exercise for at least 30 minutes, 3 times each week. You should exercise hard enough to raise your heart rate and  break a sweat. °· Keep all follow-up visits as told by your health care provider. This is important. You may need a colonoscopy. °Contact a health care provider if: °· Your pain does not improve. °· You have a hard time drinking or eating food. °· Your bowel movements do not return to normal. °Get help right away if: °· Your pain gets worse. °· Your symptoms do not get better with treatment. °· Your symptoms suddenly get worse. °· You have a fever. °· You vomit more than one time. °· You have stools that are bloody, black, or tarry. °Summary °· Diverticulitis is infection or inflammation of small pouches (diverticula) in the colon that form due to a condition called diverticulosis. Diverticula can trap stool (feces) and bacteria, causing infection and inflammation. °· You are at higher risk for this condition if you have diverticulosis and you eat a diet that does not include enough fiber. °· Most cases of this condition are mild and can be treated at home. More severe cases may need to be treated at a hospital. °· When your condition is under control, your health care provider may recommend that you have an exam called a colonoscopy. This exam can show how severe your diverticula are and whether something else may be causing your symptoms. °This information is not intended to replace advice given to you by your health care provider. Make sure you discuss any questions you have with your health care provider. °Document Released: 08/24/2005 Document Revised: 12/17/2016 Document Reviewed: 12/17/2016 °Elsevier Interactive Patient Education © 2018 [employer] ° °

## 2018-07-27 LAB — URINE CULTURE
MICRO NUMBER:: 91035785
SPECIMEN QUALITY: ADEQUATE

## 2018-07-31 ENCOUNTER — Telehealth: Payer: Self-pay | Admitting: Family Medicine

## 2018-07-31 NOTE — Telephone Encounter (Signed)
Follow up by tomorrow if not better.

## 2018-07-31 NOTE — Telephone Encounter (Signed)
Left detailed message on machine for patient to schedule a follow up appointment if no improvement.

## 2018-07-31 NOTE — Progress Notes (Signed)
Patient has been rescheduled for a colonoscopy on 09/24/18 2:30.  She verbalized understanding of her new instructions dates and time.

## 2018-07-31 NOTE — Telephone Encounter (Signed)
Copied from [place] [id number]. Topic: Quick Communication - See Telephone Encounter >> Jul 31, 2018  9:23 AM Gardiner Ramus wrote: CRM for notification. See Telephone encounter for: 07/31/18. Pt called and stated that she was in the office 07/26/18 for diverticulitis and  wanted dr Elease Hashimoto to know that her back pain is much better but her lower abdominal pain is not better. Pt would like to know if she should come in for another appointment. Please advise

## 2018-08-01 ENCOUNTER — Other Ambulatory Visit: Payer: Self-pay | Admitting: Family Medicine

## 2018-08-03 ENCOUNTER — Encounter: Payer: Medicare Other | Admitting: Internal Medicine

## 2018-09-24 ENCOUNTER — Encounter: Payer: Self-pay | Admitting: Internal Medicine

## 2018-09-24 ENCOUNTER — Ambulatory Visit (AMBULATORY_SURGERY_CENTER): Payer: Medicare Other | Admitting: Internal Medicine

## 2018-09-24 VITALS — BP 145/69 | HR 73 | Temp 97.3°F | Resp 17

## 2018-09-24 DIAGNOSIS — D125 Benign neoplasm of sigmoid colon: Secondary | ICD-10-CM

## 2018-09-24 DIAGNOSIS — Z1211 Encounter for screening for malignant neoplasm of colon: Secondary | ICD-10-CM

## 2018-09-24 DIAGNOSIS — Z800 Family history of malignant neoplasm of digestive organs: Secondary | ICD-10-CM | POA: Diagnosis not present

## 2018-09-24 DIAGNOSIS — D128 Benign neoplasm of rectum: Secondary | ICD-10-CM | POA: Diagnosis not present

## 2018-09-24 DIAGNOSIS — D127 Benign neoplasm of rectosigmoid junction: Secondary | ICD-10-CM | POA: Diagnosis not present

## 2018-09-24 MED ORDER — SODIUM CHLORIDE 0.9 % IV SOLN: 500.0000 mL | Freq: Once | INTRAVENOUS | Status: DC

## 2018-09-24 NOTE — Progress Notes (Signed)
No problems noted in the recovery room. maw 

## 2018-09-24 NOTE — Op Note (Signed)
[place] Patient Name: Glenda Francis Procedure Date: 09/24/2018 2:28 PM MRN: [id number] Endoscopist: Jerene Bears , MD Age: 67 Referring MD:  Date of Birth: 06-Aug-1951 Gender: Female Account #: [id number] Procedure:                Colonoscopy Indications:              Screening patient at increased risk: Family history                            of colorectal cancer in multiple 1st-degree                            relatives (brothers), Last colonoscopy 5 years ago Medicines:                Monitored Anesthesia Care Procedure:                Pre-Anesthesia Assessment:                           - Prior to the procedure, a History and Physical                            was performed, and patient medications and                            allergies were reviewed. The patient's tolerance of                            previous anesthesia was also reviewed. The risks                            and benefits of the procedure and the sedation                            options and risks were discussed with the patient.                            All questions were answered, and informed consent                            was obtained. Prior Anticoagulants: The patient has                            taken no previous anticoagulant or antiplatelet                            agents. ASA Grade Assessment: II - A patient with                            mild systemic disease. After reviewing the risks                            and benefits, the patient was deemed in  satisfactory condition to undergo the procedure.                           After obtaining informed consent, the colonoscope                            was passed under direct vision. Throughout the                            procedure, the patient's blood pressure, pulse, and                            oxygen saturations were monitored continuously. The                            Colonoscope was  introduced through the anus and                            advanced to the cecum, identified by appendiceal                            orifice and ileocecal valve. The colonoscopy was                            performed without difficulty. The patient tolerated                            the procedure well. The quality of the bowel                            preparation was good. The ileocecal valve,                            appendiceal orifice, and rectum were photographed. Scope In: 2:39:05 PM Scope Out: 2:52:48 PM Scope Withdrawal Time: 0 hours 9 minutes 41 seconds  Total Procedure Duration: 0 hours 13 minutes 43 seconds  Findings:                 The digital rectal exam was normal.                           Two sessile polyps were found in the rectum and                            sigmoid colon. The polyps were 3 to 4 mm in size.                            These polyps were removed with a cold snare.                            Resection and retrieval were complete.                           Multiple small and large-mouthed diverticula were  found from cecum to sigmoid colon.                           The retroflexed view of the distal rectum and anal                            verge was normal and showed no anal or rectal                            abnormalities. Complications:            No immediate complications. Estimated Blood Loss:     Estimated blood loss was minimal. Impression:               - Two 3 to 4 mm polyps in the rectum and in the                            sigmoid colon, removed with a cold snare. Resected                            and retrieved.                           - Diverticulosis from cecum to sigmoid colon.                           - The distal rectum and anal verge are normal on                            retroflexion view. Recommendation:           - Patient has a contact number available for                             emergencies. The signs and symptoms of potential                            delayed complications were discussed with the                            patient. Return to normal activities tomorrow.                            Written discharge instructions were provided to the                            patient.                           - Resume previous diet.                           - Continue present medications.                           - Await pathology results.                           -  Repeat colonoscopy in 5 years for surveillance. Jerene Bears, MD 09/24/2018 2:55:56 PM This report has been signed electronically.

## 2018-09-24 NOTE — Progress Notes (Signed)
Pt's states no medical or surgical changes since previsit or office visit. 

## 2018-09-24 NOTE — Progress Notes (Signed)
PT taken to PACU. Monitors in place. VSS. Report given to RN. 

## 2018-09-24 NOTE — Patient Instructions (Signed)
YOU HAD AN ENDOSCOPIC PROCEDURE TODAY AT THE [facility]:   Refer to the procedure report that was given to you for any specific questions about what was found during the examination.  If the procedure report does not answer your questions, please call your gastroenterologist to clarify.  If you requested that your care partner not be given the details of your procedure findings, then the procedure report has been included in a sealed envelope for you to review at your convenience later.  YOU SHOULD EXPECT: Some feelings of bloating in the abdomen. Passage of more gas than usual.  Walking can help get rid of the air that was put into your GI tract during the procedure and reduce the bloating. If you had a lower endoscopy (such as a colonoscopy or flexible sigmoidoscopy) you may notice spotting of blood in your stool or on the toilet paper. If you underwent a bowel prep for your procedure, you may not have a normal bowel movement for a few days.  Please Note:  You might notice some irritation and congestion in your nose or some drainage.  This is from the oxygen used during your procedure.  There is no need for concern and it should clear up in a day or so.  SYMPTOMS TO REPORT IMMEDIATELY:   Following lower endoscopy (colonoscopy or flexible sigmoidoscopy):  Excessive amounts of blood in the stool  Significant tenderness or worsening of abdominal pains  Swelling of the abdomen that is new, acute  Fever of 100F or higher    For urgent or emergent issues, a gastroenterologist can be reached at any hour by calling [phone].   DIET:  We do recommend a small meal at first, but then you may proceed to your regular diet.  Drink plenty of fluids but you should avoid alcoholic beverages for 24 hours.  ACTIVITY:  You should plan to take it easy for the rest of today and you should NOT DRIVE or use heavy machinery until tomorrow (because of the sedation medicines used during the test).     FOLLOW UP: Our staff will call the number listed on your records the next business day following your procedure to check on you and address any questions or concerns that you may have regarding the information given to you following your procedure. If we do not reach you, we will leave a message.  However, if you are feeling well and you are not experiencing any problems, there is no need to return our call.  We will assume that you have returned to your regular daily activities without incident.  If any biopsies were taken you will be contacted by phone or by letter within the next 1-3 weeks.  Please call us at [phone] if you have not heard about the biopsies in 3 weeks.    SIGNATURES/CONFIDENTIALITY: You and/or your care partner have signed paperwork which will be entered into your electronic medical record.  These signatures attest to the fact that that the information above on your After Visit Summary has been reviewed and is understood.  Full responsibility of the confidentiality of this discharge information lies with you and/or your care-partner.    Handouts were given to your care partner on polyps and diverticulosis. You may resume your current medications today. Await biopsy results. Please call if any questions or concerns.   

## 2018-09-24 NOTE — Progress Notes (Signed)
Called to room to assist during endoscopic procedure.  Patient ID and intended procedure confirmed with present staff. Received instructions for my participation in the procedure from the performing physician.  

## 2018-09-25 ENCOUNTER — Telehealth: Payer: Self-pay

## 2018-09-25 NOTE — Telephone Encounter (Signed)
  Follow up Call-  Call back number 09/24/2018  Post procedure Call Back phone  # [phone]  Permission to leave phone message Yes  Some recent data might be hidden     Patient questions:  Do you have a fever, pain , or abdominal swelling? No. Pain Score  0 *  Have you tolerated food without any problems? Yes.    Have you been able to return to your normal activities? Yes.    Do you have any questions about your discharge instructions: Diet   No. Medications  No. Follow up visit  No.  Do you have questions or concerns about your Care? No.  Actions: * If pain score is 4 or above: No action needed, pain <4.

## 2018-10-01 ENCOUNTER — Encounter: Payer: Self-pay | Admitting: Internal Medicine

## 2018-10-26 ENCOUNTER — Ambulatory Visit: Payer: Self-pay

## 2018-10-26 NOTE — Telephone Encounter (Signed)
Pt c/o left lower abdominal pain since August. Pt stated that she thinks that  diverticulitis is causing her pain. Pt rated pain as moderate. Pt stated she had similar symptoms before and was given antibiotics that improved her symptoms. Pt stated the pain comes and goes.Pt has been taking Ibuprofen and has gotten some relief.   Pt denies vomiting, diarrhea, constipation or any urinary symptoms.  Pt given care advice and pt verbalized understanding. Pt given appointment with Dr Elease Hashimoto Monday at 1000. Pt advised to go to ED or [facility] if pain worsens.  Reason for Disposition . Abdominal pain is a chronic symptom (recurrent or ongoing AND present > 4 weeks)  Answer Assessment - Initial Assessment Questions 1. LOCATION: "Where does it hurt?"      Left lower side of abdomen h/o diverticulitis 2. RADIATION: "Does the pain shoot anywhere else?" (e.g., chest, back)     no 3. ONSET: "When did the pain begin?" (e.g., minutes, hours or days ago)      August 2019 4. SUDDEN: "Gradual or sudden onset?"     Gradual put on 2 types of antibiotics 5. PATTERN "Does the pain come and go, or is it constant?"    - If constant: "Is it getting better, staying the same, or worsening?"      (Note: Constant means the pain never goes away completely; most serious pain is constant and it progresses)     - If intermittent: "How long does it last?" "Do you have pain now?"     (Note: Intermittent means the pain goes away completely between bouts)     Comes and goes  6. SEVERITY: "How bad is the pain?"  (e.g., Scale 1-10; mild, moderate, or severe)   - MILD (1-3): doesn't interfere with normal activities, abdomen soft and not tender to touch    - MODERATE (4-7): interferes with normal activities or awakens from sleep, tender to touch    - SEVERE (8-10): excruciating pain, doubled over, unable to do any normal activities      Moderate worse with standing or when toughing that side or when she goes to the bathroom 7.  RECURRENT SYMPTOM: "Have you ever had this type of abdominal pain before?" If so, ask: "When was the last time?" and "What happened that time?"      Yes-2 abx given 8. CAUSE: "What do you think is causing the abdominal pain?"     diverticultitis 9. RELIEVING/AGGRAVATING FACTORS: "What makes it better or worse?" (e.g., movement, antacids, bowel movement)     Took 2 ibuprofen with relief 10. OTHER SYMPTOMS: "Has there been any vomiting, diarrhea, constipation, or urine problems?"       no 11. PREGNANCY: "Is there any chance you are pregnant?" "When was your last menstrual period?"       n/a  Protocols used: ABDOMINAL PAIN - [facility]

## 2018-10-29 ENCOUNTER — Encounter: Payer: Self-pay | Admitting: Family Medicine

## 2018-10-29 ENCOUNTER — Ambulatory Visit (INDEPENDENT_AMBULATORY_CARE_PROVIDER_SITE_OTHER): Payer: Medicare Other | Admitting: Family Medicine

## 2018-10-29 ENCOUNTER — Other Ambulatory Visit: Payer: Self-pay

## 2018-10-29 VITALS — BP 132/80 | HR 68 | Temp 97.8°F | Ht 60.0 in | Wt 136.4 lb

## 2018-10-29 DIAGNOSIS — I10 Essential (primary) hypertension: Secondary | ICD-10-CM | POA: Diagnosis not present

## 2018-10-29 DIAGNOSIS — R1032 Left lower quadrant pain: Secondary | ICD-10-CM | POA: Diagnosis not present

## 2018-10-29 MED ORDER — CIPROFLOXACIN HCL 500 MG PO TABS: 500.0000 mg | ORAL_TABLET | Freq: Two times a day (BID) | ORAL | 0 refills | Status: DC

## 2018-10-29 MED ORDER — METRONIDAZOLE 500 MG PO TABS: 500.0000 mg | ORAL_TABLET | Freq: Three times a day (TID) | ORAL | 0 refills | Status: DC

## 2018-10-29 MED ORDER — LOSARTAN POTASSIUM 100 MG PO TABS: 100.0000 mg | ORAL_TABLET | Freq: Every day | ORAL | 3 refills | Status: DC

## 2018-10-29 NOTE — Patient Instructions (Signed)
Let me know by next week if pain not totally resolved.

## 2018-10-29 NOTE — Progress Notes (Signed)
Subjective:     Patient ID: Glenda Francis, female   DOB: 08-26-1951, 67 y.o.   MRN: [id number]  HPI Patient here for the following issues  Onset this past Thursday on Thanksgiving of left lower quadrant abdominal pain.  She had very similar pain back in August which promptly resolved with antibiotics with presumed acute diverticulitis then.  She had repeat colonoscopy 09/24/2018 and had 2 polyps removed.  She has not had any bloody stools.  No fever.  She describes somewhat intermittent pain left lower quadrant.  No urinary symptoms.  No nausea or vomiting.  Appetite fair.  Colonoscopy did reveal diverticulosis changes sigmoid colon  Also requesting losartan refill.  Blood pressures been stable.  No headaches or dizziness.  Compliant with therapy.  Past Medical History:  Diagnosis Date  . Anemia    as a child  . Asthma    childhood  . Cervicogenic headache 08/20/2015   Left side  . Chicken pox   . Depression    when son passed away  . Family history of colon cancer   . Family history of ovarian cancer   . Family history of pancreatic cancer   . Family history of stomach cancer   . Family history of uterine cancer   . Hypertension   . Seizures ([place]) 1   started in 07/2008/ last seizure was in 2012   Past Surgical History:  Procedure Laterality Date  . ABDOMINAL HYSTERECTOMY  1989   partial, menorrhagia  . CATARACT EXTRACTION Bilateral 10/26/2015, 12-04/2015   laser  . COLONOSCOPY      reports that she has quit smoking. She has never used smokeless tobacco. She reports that she drinks about 7.0 standard drinks of alcohol per week. She reports that she does not use drugs. family history includes Asthma in her other; Cancer in her brother, brother, maternal aunt, maternal aunt, and mother; Colon cancer in her brother, brother, cousin, and other; Hyperlipidemia in her other; Other in her sister; Ovarian cancer in her maternal aunt, maternal aunt, mother, and other; Pancreatic cancer  in her brother; Uterine cancer in her maternal aunt, maternal aunt, mother, and other. No Known Allergies   Review of Systems  Constitutional: Negative for chills, fever and unexpected weight change.  Eyes: Negative for visual disturbance.  Respiratory: Negative for cough, chest tightness, shortness of breath and wheezing.   Cardiovascular: Negative for chest pain, palpitations and leg swelling.  Gastrointestinal: Positive for abdominal pain. Negative for abdominal distention, blood in stool, constipation, diarrhea, nausea and vomiting.  Endocrine: Negative for polydipsia and polyuria.  Neurological: Negative for dizziness, seizures, syncope, weakness, light-headedness and headaches.       Objective:   Physical Exam  Constitutional: She appears well-developed and well-nourished.  Cardiovascular: Normal rate and regular rhythm.  Pulmonary/Chest: Effort normal and breath sounds normal.  Abdominal: Normal appearance and bowel sounds are normal. There is no hepatosplenomegaly. There is tenderness in the left lower quadrant. There is no rigidity, no rebound and no guarding. No hernia.  Skin: No rash noted.       Assessment:     #1 left lower quadrant abdominal pain.  Suspect acute diverticulitis flare.  Nonacute abdomen  #2 hypertension stable    Plan:     -Refill losartan for 1 year -Start back Cipro 500 mg twice daily for 10 days and Flagyl 500 mg 3 times daily for 10 days -If pain not fully relieved by next week follow-up and will consider CT scan. -Follow-up immediately  for any increasing fever, increased abdominal pain, vomiting, or other change of symptoms  Eulas Post MD [facility] at [facility]

## 2018-10-30 ENCOUNTER — Other Ambulatory Visit: Payer: Self-pay | Admitting: Family Medicine

## 2019-01-14 ENCOUNTER — Encounter: Payer: Self-pay | Admitting: Family Medicine

## 2019-01-14 ENCOUNTER — Other Ambulatory Visit: Payer: Self-pay

## 2019-01-14 ENCOUNTER — Ambulatory Visit (INDEPENDENT_AMBULATORY_CARE_PROVIDER_SITE_OTHER): Payer: Medicare Other | Admitting: Family Medicine

## 2019-01-14 VITALS — BP 120/70 | HR 72 | Temp 98.1°F | Ht 60.0 in | Wt 137.6 lb

## 2019-01-14 DIAGNOSIS — R1030 Lower abdominal pain, unspecified: Secondary | ICD-10-CM

## 2019-01-14 LAB — CBC WITH DIFFERENTIAL/PLATELET
Basophils Absolute: 0 10*3/uL (ref 0.0–0.1)
Basophils Relative: 0.4 % (ref 0.0–3.0)
EOS ABS: 0.1 10*3/uL (ref 0.0–0.7)
Eosinophils Relative: 1.2 % (ref 0.0–5.0)
HCT: 39.4 % (ref 36.0–46.0)
Hemoglobin: 13.6 g/dL (ref 12.0–15.0)
Lymphocytes Relative: 33.5 % (ref 12.0–46.0)
Lymphs Abs: 2.8 10*3/uL (ref 0.7–4.0)
MCHC: 34.5 g/dL (ref 30.0–36.0)
MCV: 89.7 fl (ref 78.0–100.0)
MONO ABS: 0.9 10*3/uL (ref 0.1–1.0)
Monocytes Relative: 11.5 % (ref 3.0–12.0)
Neutro Abs: 4.4 10*3/uL (ref 1.4–7.7)
Neutrophils Relative %: 53.4 % (ref 43.0–77.0)
Platelets: 281 10*3/uL (ref 150.0–400.0)
RBC: 4.4 Mil/uL (ref 3.87–5.11)
RDW: 12.4 % (ref 11.5–15.5)
WBC: 8.2 10*3/uL (ref 4.0–10.5)

## 2019-01-14 LAB — COMPREHENSIVE METABOLIC PANEL
ALT: 9 U/L (ref 0–35)
AST: 12 U/L (ref 0–37)
Albumin: 4.2 g/dL (ref 3.5–5.2)
Alkaline Phosphatase: 54 U/L (ref 39–117)
BUN: 13 mg/dL (ref 6–23)
CO2: 32 mEq/L (ref 19–32)
Calcium: 9.7 mg/dL (ref 8.4–10.5)
Chloride: 97 mEq/L (ref 96–112)
Creatinine, Ser: 0.63 mg/dL (ref 0.40–1.20)
GFR: 94.49 mL/min (ref >60.00)
Glucose, Bld: 93 mg/dL (ref 70–99)
Potassium: 4.8 mEq/L (ref 3.5–5.1)
Sodium: 136 mEq/L (ref 135–145)
Total Bilirubin: 0.3 mg/dL (ref 0.2–1.2)
Total Protein: 6.9 g/dL (ref 6.0–8.3)

## 2019-01-14 LAB — POCT URINALYSIS DIPSTICK
Bilirubin, UA: NEGATIVE
Glucose, UA: NEGATIVE
Ketones, UA: NEGATIVE
Leukocytes, UA: NEGATIVE
Nitrite, UA: NEGATIVE
Protein, UA: NEGATIVE
RBC UA: NEGATIVE
Spec Grav, UA: 1.015 (ref 1.010–1.025)
Urobilinogen, UA: 0.2 E.U./dL
pH, UA: 6.5 (ref 5.0–8.0)

## 2019-01-14 MED ORDER — LOSARTAN POTASSIUM 100 MG PO TABS: 100.0000 mg | ORAL_TABLET | Freq: Every day | ORAL | 3 refills | Status: DC

## 2019-01-14 NOTE — Addendum Note (Signed)
Addended by: Eulas Post on: 01/14/2019 08:53 PM   Modules accepted: Orders

## 2019-01-14 NOTE — Patient Instructions (Signed)
Abdominal Pain, Adult  Abdominal pain can be caused by many things. Often, abdominal pain is not serious and it gets better with no treatment or by being treated at home. However, sometimes abdominal pain is serious. Your health care provider will do a medical history and a physical exam to try to determine the cause of your abdominal pain.  Follow these instructions at home:   Take over-the-counter and prescription medicines only as told by your health care provider. Do not take a laxative unless told by your health care provider.   Drink enough fluid to keep your urine clear or pale yellow.   Watch your condition for any changes.   Keep all follow-up visits as told by your health care provider. This is important.  Contact a health care provider if:   Your abdominal pain changes or gets worse.   You are not hungry or you lose weight without trying.   You are constipated or have diarrhea for more than 2-3 days.   You have pain when you urinate or have a bowel movement.   Your abdominal pain wakes you up at night.   Your pain gets worse with meals, after eating, or with certain foods.   You are throwing up and cannot keep anything down.   You have a fever.  Get help right away if:   Your pain does not go away as soon as your health care provider told you to expect.   You cannot stop throwing up.   Your pain is only in areas of the abdomen, such as the right side or the left lower portion of the abdomen.   You have bloody or black stools, or stools that look like tar.   You have severe pain, cramping, or bloating in your abdomen.   You have signs of dehydration, such as:  ? Dark urine, very little urine, or no urine.  ? Cracked lips.  ? Dry mouth.  ? Sunken eyes.  ? Sleepiness.  ? Weakness.  This information is not intended to replace advice given to you by your health care provider. Make sure you discuss any questions you have with your health care provider.  Document Released: 08/24/2005 Document  Revised: 06/03/2016 Document Reviewed: 04/27/2016  Elsevier Interactive Patient Education  2019 [employer] Inc[employer]

## 2019-01-14 NOTE — Progress Notes (Signed)
Subjective:     Patient ID: Glenda Francis, female   DOB: [birth year], 68 y.o.   MRN: [id number]  HPI Patient is seen with somewhat poorly localized bilateral lower abdominal pain she states since October.  She had colonoscopy October 28.  She states she has had some lower abdominal pain since then.  She had 2 benign polyps which were removed.  She has not had a change in stools.  No bloody stools.  She was seen here back in early December and we suspected possible acute diverticulitis.  She was treated with antibiotics but has not seen much change.  She denies any fever or any appetite or weight changes.  No urinary symptoms.  Nursing notes mentioned "lymphadenopathy ".  Patient though denies any inguinal adenopathy.  She has noticed some increased sensitivity of submandibular glands bilaterally in her neck but has not really had any lymph node enlargement.  Her pain is slightly worse with movement.  No back pain.  No alleviating factors.  Her mother had ovarian cancer.  She had 2 siblings with Lynch syndrome and brother just died.  He had multiple cancers apparently.  Patient was tested for Lynch syndrome by geneticist about a year ago and came back Lynch negative.  Patient has had previous partial hysterectomy but has both ovaries  Past Medical History:  Diagnosis Date  . Anemia    as a child  . Asthma    childhood  . Cervicogenic headache 08/20/2015   Left side  . Chicken pox   . Depression    when son passed away  . Family history of colon cancer   . Family history of ovarian cancer   . Family history of pancreatic cancer   . Family history of stomach cancer   . Family history of uterine cancer   . Hypertension   . Seizures ([place]) 1   started in 07/2008/ last seizure was in 2012   Past Surgical History:  Procedure Laterality Date  . ABDOMINAL HYSTERECTOMY  1989   partial, menorrhagia  . CATARACT EXTRACTION Bilateral 10/26/2015, 12-04/2015   laser  . COLONOSCOPY      reports that  she has quit smoking. She has never used smokeless tobacco. She reports current alcohol use of about 7.0 standard drinks of alcohol per week. She reports that she does not use drugs. family history includes Asthma in an other family member; Cancer in her brother, brother, maternal aunt, maternal aunt, and mother; Colon cancer in her brother, brother, cousin, and another family member; Hyperlipidemia in an other family member; Other in her sister; Ovarian cancer in her maternal aunt, maternal aunt, mother, and another family member; Pancreatic cancer in her brother; Uterine cancer in her maternal aunt, maternal aunt, mother, and another family member. No Known Allergies   Review of Systems  Constitutional: Negative for appetite change, chills, fever and unexpected weight change.  Respiratory: Negative for cough and shortness of breath.   Cardiovascular: Negative for chest pain.  Gastrointestinal: Positive for abdominal pain. Negative for blood in stool, constipation, diarrhea, nausea, rectal pain and vomiting.  Genitourinary: Negative for dysuria and flank pain.  Skin: Negative for rash.  Hematological: Negative for adenopathy.       Objective:   Physical Exam Constitutional:      Appearance: Normal appearance.  Neck:     Musculoskeletal: Neck supple.     Comments: Slightly prominent and slightly tender submandibular glands bilaterally.  No lymphadenopathy noted.  No parotid gland enlargement. Cardiovascular:  Rate and Rhythm: Normal rate and regular rhythm.  Pulmonary:     Effort: Pulmonary effort is normal.     Breath sounds: Normal breath sounds.  Abdominal:     General: Abdomen is flat. Bowel sounds are normal. There is no distension.     Palpations: Abdomen is soft. There is no mass.     Tenderness: There is no abdominal tenderness. There is no guarding or rebound.  Musculoskeletal:     Right lower leg: No edema.     Left lower leg: No edema.  Lymphadenopathy:     Cervical:  No cervical adenopathy.  Neurological:     Mental Status: She is alert.        Assessment:     Patient presents with several month history of persistent nonspecific lower abdominal pain which is bilateral.  This is almost more in the pelvic region.  Nonfocal exam.  She does not have any red flags such as appetite change ,weight loss, fever, etc.    Plan:     -Obtain urinalysis along with CBC and comprehensive metabolic panel -Given duration of symptoms obtain CT abdomen and pelvis to further evaluate  Eulas Post MD [facility] at [facility]

## 2019-01-16 ENCOUNTER — Ambulatory Visit (INDEPENDENT_AMBULATORY_CARE_PROVIDER_SITE_OTHER)
Admission: RE | Admit: 2019-01-16 | Discharge: 2019-01-16 | Disposition: A | Discharge status: Home / Self Care | Payer: Medicare Other | Source: Ambulatory Visit | Attending: Family Medicine | Admitting: Family Medicine

## 2019-01-16 DIAGNOSIS — R1030 Lower abdominal pain, unspecified: Secondary | ICD-10-CM

## 2019-01-21 ENCOUNTER — Telehealth: Payer: Self-pay

## 2019-01-21 NOTE — Telephone Encounter (Signed)
Patient has been called and given CR Results. Patient verbalized an understanding. See result note for further detail.  Copied from [place] [id number]. Topic: General - Inquiry >> Jan 21, 2019 10:48 AM Rayann Heman wrote: Reason for CRM: requested CT results from 01/17/19

## 2019-02-12 DIAGNOSIS — H40033 Anatomical narrow angle, bilateral: Secondary | ICD-10-CM | POA: Diagnosis not present

## 2019-02-12 DIAGNOSIS — H00024 Hordeolum internum left upper eyelid: Secondary | ICD-10-CM | POA: Diagnosis not present

## 2019-03-13 ENCOUNTER — Telehealth: Payer: Self-pay | Admitting: Neurology

## 2019-03-13 NOTE — Telephone Encounter (Signed)
Due to current COVID 19 pandemic, our office is severely reducing in office visits for at least the next 2 weeks, in order to minimize the risk to our patients and healthcare providers. Pt understands that although there may be some limitations with this type of visit, we will take all precautions to reduce any security or privacy concerns.  Pt understands that this will be treated like an in office visit and we will file with pt's insurance, and there may be a patient responsible charge related to this service. Pt's number is [phone]

## 2019-03-14 NOTE — Telephone Encounter (Signed)
I contacted the pt and I was able to complete the pre charting for 4/20 appt. EMR has been updated.

## 2019-03-18 ENCOUNTER — Ambulatory Visit: Payer: Medicare Other | Admitting: Nurse Practitioner

## 2019-03-18 ENCOUNTER — Other Ambulatory Visit: Payer: Self-pay

## 2019-03-18 ENCOUNTER — Encounter: Payer: Self-pay | Admitting: Neurology

## 2019-03-18 ENCOUNTER — Ambulatory Visit (INDEPENDENT_AMBULATORY_CARE_PROVIDER_SITE_OTHER): Payer: Medicare Other | Admitting: Neurology

## 2019-03-18 DIAGNOSIS — G40409 Other generalized epilepsy and epileptic syndromes, not intractable, without status epilepticus: Secondary | ICD-10-CM

## 2019-03-18 MED ORDER — LEVETIRACETAM 500 MG PO TABS: 1000.0000 mg | ORAL_TABLET | Freq: Two times a day (BID) | ORAL | 3 refills | Status: DC

## 2019-03-18 MED ORDER — DIVALPROEX SODIUM 500 MG PO DR TAB: DELAYED_RELEASE_TABLET | ORAL | 3 refills | Status: DC

## 2019-03-18 NOTE — Progress Notes (Signed)
     Virtual Visit via Telephone Note  I connected with Glenda Francis on 03/18/19 at 10:30 AM EDT by telephone and verified that I am speaking with the correct person using two identifiers.   I discussed the limitations, risks, security and privacy concerns of performing an evaluation and management service by telephone and the availability of in person appointments. I also discussed with the patient that there may be a patient responsible charge related to this service. The patient expressed understanding and agreed to proceed.   History of Present Illness: Glenda Francis is a 68 year old right-handed Hispanic female with a history of well-controlled seizures on Keppra and Depakote.  She tolerates his medications well, she has not had any seizures since last seen.  She is able to operate a motor vehicle.  She did have routine blood work done in February 2020.  She reports that she tries to exercise on a regular basis, she is able to sleep well at night.  No other new medical issues have come up since last seen.   Observations/Objective: On the telephone interview, the patient appears to be bright and alert and cooperative.  She responds to questions appropriately, no evidence of aphasia or dysarthria is noted.  Assessment and Plan: 1.  History of seizures, well controlled  Prescriptions for her Depakote and Keppra were sent in, she will follow-up here in about 6 months.  We will check blood levels of the medication at that time.  Follow Up Instructions: 73-month follow-up, may see nurse practitioner   I discussed the assessment and treatment plan with the patient. The patient was provided an opportunity to ask questions and all were answered. The patient agreed with the plan and demonstrated an understanding of the instructions.   The patient was advised to call back or seek an in-person evaluation if the symptoms worsen or if the condition fails to improve as anticipated.  I provided 15  minutes of non-face-to-face time during this encounter.   Kathrynn Ducking, MD

## 2019-04-02 ENCOUNTER — Other Ambulatory Visit: Payer: Self-pay

## 2019-04-02 ENCOUNTER — Ambulatory Visit (INDEPENDENT_AMBULATORY_CARE_PROVIDER_SITE_OTHER): Payer: Medicare Other | Admitting: Family Medicine

## 2019-04-02 DIAGNOSIS — R300 Dysuria: Secondary | ICD-10-CM

## 2019-04-02 MED ORDER — CIPROFLOXACIN HCL 500 MG PO TABS: 500.0000 mg | ORAL_TABLET | Freq: Two times a day (BID) | ORAL | 0 refills | Status: DC

## 2019-04-02 NOTE — Progress Notes (Signed)
This visit type was conducted due to national recommendations for restrictions regarding the COVID-19 pandemic in an effort to limit this patient's exposure and mitigate transmission in our community.   Virtual Visit via Telephone Note  I connected with Glenda Francis on 04/02/19 at 11:15 AM EDT by telephone and verified that I am speaking with the correct person using two identifiers.   I discussed the limitations, risks, security and privacy concerns of performing an evaluation and management service by telephone and the availability of in person appointments. I also discussed with the patient that there may be a patient responsible charge related to this service. The patient expressed understanding and agreed to proceed.  Location patient: home Location provider: work or home office Participants present for the call: patient, provider Patient did not have a visit in the prior 7 days to address this/these issue(s).   History of Present Illness: She relates 1 day history of burning with urination and some increased frequency.  No fevers or chills.  No back pain.  No nausea or vomiting.  Similar symptoms with acute cystitis in the past.  She has taken Cipro without difficulty previously.  No recent UTI.   Observations/Objective: Patient sounds cheerful and well on the phone. I do not appreciate any SOB. Speech and thought processing are grossly intact. Patient reported vitals:  Assessment and Plan: 1 day history of dysuria.  Suspect uncomplicated cystitis  -Cipro 500 mg twice daily for 5 days -Plenty of fluids -Follow-up here in 2 days if symptoms not greatly improved  Follow Up Instructions:  -As above  I did not refer this patient for an OV in the next 24 hours for this/these issue(s).  I discussed the assessment and treatment plan with the patient. The patient was provided an opportunity to ask questions and all were answered. The patient agreed with the plan and demonstrated an  understanding of the instructions.   The patient was advised to call back or seek an in-person evaluation if the symptoms worsen or if the condition fails to improve as anticipated.  I provided 10 minutes of non-face-to-face time during this encounter.   Carolann Littler, MD

## 2019-05-06 ENCOUNTER — Other Ambulatory Visit: Payer: Self-pay | Admitting: Neurology

## 2019-05-06 MED ORDER — LEVETIRACETAM 500 MG PO TABS: 1000.0000 mg | ORAL_TABLET | Freq: Two times a day (BID) | ORAL | 3 refills | Status: DC

## 2019-05-06 MED ORDER — DIVALPROEX SODIUM 500 MG PO DR TAB: DELAYED_RELEASE_TABLET | ORAL | 3 refills | Status: DC

## 2019-05-06 NOTE — Addendum Note (Signed)
Addended by: Verlin Grills T on: 05/06/2019 12:57 PM   Modules accepted: Orders

## 2019-07-08 DIAGNOSIS — G40909 Epilepsy, unspecified, not intractable, without status epilepticus: Secondary | ICD-10-CM | POA: Insufficient documentation

## 2019-07-19 ENCOUNTER — Other Ambulatory Visit: Payer: Self-pay

## 2019-07-19 ENCOUNTER — Telehealth (INDEPENDENT_AMBULATORY_CARE_PROVIDER_SITE_OTHER): Payer: Medicare Other | Admitting: Adult Health

## 2019-07-19 ENCOUNTER — Telehealth: Payer: Self-pay | Admitting: Family Medicine

## 2019-07-19 DIAGNOSIS — K5792 Diverticulitis of intestine, part unspecified, without perforation or abscess without bleeding: Secondary | ICD-10-CM

## 2019-07-19 MED ORDER — METRONIDAZOLE 500 MG PO TABS: 500.0000 mg | ORAL_TABLET | Freq: Three times a day (TID) | ORAL | 0 refills | Status: AC

## 2019-07-19 MED ORDER — CIPROFLOXACIN HCL 500 MG PO TABS: 500.0000 mg | ORAL_TABLET | Freq: Two times a day (BID) | ORAL | 0 refills | Status: DC

## 2019-07-19 NOTE — Telephone Encounter (Signed)
Please call to schedule appointment.

## 2019-07-19 NOTE — Telephone Encounter (Signed)
Attempted to call the office, three times. Patient is calling to get an appt today with Dr. Elease Hashimoto for an antibiotic related to diverticulitis.   Please advise CB- [phone]

## 2019-07-19 NOTE — Progress Notes (Signed)
Virtual Visit via Telephone Note  I connected with [name] on 07/19/19 at  4:00 PM EDT by telephone and verified that I am speaking with the correct person using two identifiers.   I discussed the limitations, risks, security and privacy concerns of performing an evaluation and management service by telephone and the availability of in person appointments. I also discussed with the patient that there may be a patient responsible charge related to this service. The patient expressed understanding and agreed to proceed.  Location patient: home Location provider: work or home office Participants present for the call: patient, provider Patient did not have a visit in the prior 7 days to address this/these issue(s).   History of Present Illness:  68 year old female who  has a past medical history of Anemia, Asthma, Cervicogenic headache (08/20/2015), Chicken pox, Depression, Family history of colon cancer, Family history of ovarian cancer, Family history of pancreatic cancer, Family history of stomach cancer, Family history of uterine cancer, Hypertension, and Seizures ([place]) (1).  She is a patient of Dr. Elease Hashimoto who I am seeing today for an acute on chronic issue of abdominal pain.  He woke up yesterday morning experiencing left lower quadrant abdominal pain, which she has a history of in the past with diverticulitis.  Her last colonoscopy in 2019 showed multiple small and large mouth diverticula that were found in the cecum to sigmoid colon.  She has not had any bloody stools but had 3 episodes of diarrhea yesterday and one episode this morning.  She denies fever.  She also has not experienced nausea or vomiting.  Denies urinary symptoms.  Appetite is good  She reports pain is in the center of the last time she had diverticulitis   Observations/Objective: Patient sounds cheerful and well on the phone. I do not appreciate any SOB. Speech and thought processing are grossly intact. Patient  reported vitals:  Assessment and Plan: 1. Acute diverticulitis -We will treat for diverticulitis Cipro and Flagyl.  She was advised if pain is not fully resolved by early next week to follow-up with her PCP.  If over the weekend she has increasing fever, increased abdominal pain, vomiting or other changes and she needs to be seen in the emergency room - ciprofloxacin (CIPRO) 500 MG tablet; Take 1 tablet (500 mg total) by mouth 2 (two) times daily.  Dispense: 20 tablet; Refill: 0 - metroNIDAZOLE (FLAGYL) 500 MG tablet; Take 1 tablet (500 mg total) by mouth 3 (three) times daily for 10 days.  Dispense: 30 tablet; Refill: 0   Follow Up Instructions:  I did not refer this patient for an OV in the next 24 hours for this/these issue(s).  I discussed the assessment and treatment plan with the patient. The patient was provided an opportunity to ask questions and all were answered. The patient agreed with the plan and demonstrated an understanding of the instructions.   The patient was advised to call back or seek an in-person evaluation if the symptoms worsen or if the condition fails to improve as anticipated.  I provided 30 minutes of non-face-to-face time during this encounter.   Dorothyann Peng, NP

## 2019-09-16 NOTE — Progress Notes (Signed)
PATIENT: Glenda Francis DOB: [birth year]  REASON FOR VISIT: follow up HISTORY FROM: patient  HISTORY OF PRESENT ILLNESS: Today 09/17/19 Glenda Francis is a 68 year old female with history of well-controlled seizures on Keppra and Depakote.  She continues to tolerate her medications well.  She has not had recurrent seizure in nearly 10 years.  She lives alone, and she is able to operate a motor vehicle.  She is able to perform all of her own ADLs.  She has routine care with her OB/GYN and primary doctor.  She denies any trouble ambulating or any falls.  She denies any new problems or concerns.  She presents today for follow-up unaccompanied.  HISTORY 03/18/2019 Dr. Jannifer Franklin: Glenda Francis is a 68 year old right-handed Hispanic female with a history of well-controlled seizures on Keppra and Depakote.  She tolerates his medications well, she has not had any seizures since last seen.  She is able to operate a motor vehicle.  She did have routine blood work done in February 2020.  She reports that she tries to exercise on a regular basis, she is able to sleep well at night.  No other new medical issues have come up since last seen.   REVIEW OF SYSTEMS: Out of a complete 14 system review of symptoms, the patient complains only of the following symptoms, and all other reviewed systems are negative.  Seizures  ALLERGIES: No Known Allergies  HOME MEDICATIONS: Outpatient Medications Prior to Visit  Medication Sig Dispense Refill  . Calcium Carbonate-Vitamin D (CALCIUM + D PO) Take 1 capsule by mouth daily.    . divalproex (DEPAKOTE) 500 MG DR tablet TAKE 1 TABLET (500 MG TOTAL) BY MOUTH 2 (TWO) TIMES DAILY. 180 tablet 3  . levETIRAcetam (KEPPRA) 500 MG tablet Take 2 tablets (1,000 mg total) by mouth every 12 (twelve) hours. 360 tablet 3  . losartan (COZAAR) 100 MG tablet TAKE 1 TABLET BY MOUTH DAILY 90 tablet 3  . Multiple Vitamins-Minerals (MULTIVITAMIN PO) Take 1 capsule by mouth daily.    .  ciprofloxacin (CIPRO) 500 MG tablet Take 1 tablet (500 mg total) by mouth 2 (two) times daily. 20 tablet 0  . meloxicam (MOBIC) 15 MG tablet TAKE 1 TABLET BY MOUTH DAILY (Patient taking differently: as needed. Rarely needs) 30 tablet 0  . triamcinolone cream (KENALOG) 0.1 % Apply 1 application topically 2 (two) times daily as needed. 30 g 0   No facility-administered medications prior to visit.     PAST MEDICAL HISTORY: Past Medical History:  Diagnosis Date  . Anemia    as a child  . Asthma    childhood  . Cervicogenic headache 08/20/2015   Left side  . Chicken pox   . Depression    when son passed away  . Family history of colon cancer   . Family history of ovarian cancer   . Family history of pancreatic cancer   . Family history of stomach cancer   . Family history of uterine cancer   . Hypertension   . Seizures ([facility]) 1   started in 07/2008/ last seizure was in 2012    PAST SURGICAL HISTORY: Past Surgical History:  Procedure Laterality Date  . ABDOMINAL HYSTERECTOMY  1989   partial, menorrhagia  . CATARACT EXTRACTION Bilateral 10/26/2015, 12-04/2015   laser  . COLONOSCOPY      FAMILY HISTORY: Family History  Problem Relation Age of Onset  . Cancer Mother        uterine,ovarian  . Uterine cancer  Mother   . Ovarian cancer Mother   . Cancer Brother        colon-PASSEDA WAY 54, pancreatic  . Colon cancer Brother   . Pancreatic cancer Brother   . Cancer Brother        colon,stomach,brain  . Colon cancer Brother   . Other Sister        Pos for Lynch syndrome  . Colon cancer Other        died at 75  . Asthma Other   . Hyperlipidemia Other   . Cancer Maternal Aunt        uterine and ovarian  . Uterine cancer Maternal Aunt   . Ovarian cancer Maternal Aunt   . Cancer Maternal Aunt        ovarian and uterine  . Uterine cancer Maternal Aunt   . Ovarian cancer Maternal Aunt   . Uterine cancer Other        died at 67  . Ovarian cancer Other   . Colon cancer  Cousin        6 maternal cousins with colon cancer    SOCIAL HISTORY: Social History   Socioeconomic History  . Marital status: Divorced    Spouse name: Not on file  . Number of children: 3  . Years of education: college  . Highest education level: Not on file  Occupational History  . Occupation: retired  Scientific laboratory technician  . Financial resource strain: Not on file  . Food insecurity    Worry: Not on file    Inability: Not on file  . Transportation needs    Medical: Not on file    Non-medical: Not on file  Tobacco Use  . Smoking status: Former Research scientist (life sciences)  . Smokeless tobacco: Never Used  . Tobacco comment: only social  Substance and Sexual Activity  . Alcohol use: Yes    Alcohol/week: 7.0 standard drinks    Types: 7 Glasses of wine per week    Comment: 1 GLASS WINE WITH DINNER  . Drug use: No  . Sexual activity: Not Currently  Lifestyle  . Physical activity    Days per week: Not on file    Minutes per session: Not on file  . Stress: Not on file  Relationships  . Social Herbalist on phone: Not on file    Gets together: Not on file    Attends religious service: Not on file    Active member of club or organization: Not on file    Attends meetings of clubs or organizations: Not on file    Relationship status: Not on file  . Intimate partner violence    Fear of current or ex partner: Not on file    Emotionally abused: Not on file    Physically abused: Not on file    Forced sexual activity: Not on file  Other Topics Concern  . Not on file  Social History Narrative   Patient is divorced   Patient  has 3 children.   Patient is retired   Patient has some college.   Patient is currently doing yoga and water aerobics.   Patient drinks 1-2 cups of caffeine daily.   Patient is right handed.       PHYSICAL EXAM  Vitals:   09/17/19 1410  BP: (!) 169/82  Pulse: 66  Temp: 97.9 F (36.6 C)  Weight: 135 lb (61.2 kg)  Height: 4\' 11"  (1.499 m)   Body mass  index is  27.27 kg/m.  Generalized: Well developed, in no acute distress   Neurological examination  Mentation: Alert oriented to time, place, history taking. Follows all commands speech and language fluent Cranial nerve II-XII: Pupils were equal round reactive to light. Extraocular movements were full, visual field were full on confrontational test. Facial sensation and strength were normal. Head turning and shoulder shrug  were normal and symmetric. Motor: The motor testing reveals 5 over 5 strength of all 4 extremities. Good symmetric motor tone is noted throughout.  Sensory: Sensory testing is intact to soft touch on all 4 extremities. No evidence of extinction is noted.  Coordination: Cerebellar testing reveals good finger-nose-finger and heel-to-shin bilaterally.  Gait and station: Gait is normal. Tandem gait is normal. Romberg is negative. No drift is seen.  Reflexes: Deep tendon reflexes are symmetric and normal bilaterally.   DIAGNOSTIC DATA (LABS, IMAGING, TESTING) - I reviewed patient records, labs, notes, testing and imaging myself where available.  Lab Results  Component Value Date   WBC 8.2 01/14/2019   HGB 13.6 01/14/2019   HCT 39.4 01/14/2019   MCV 89.7 01/14/2019   PLT 281.0 01/14/2019      Component Value Date/Time   NA 136 01/14/2019 1214   NA 137 03/12/2018 1238   K 4.8 01/14/2019 1214   CL 97 01/14/2019 1214   CO2 32 01/14/2019 1214   GLUCOSE 93 01/14/2019 1214   BUN 13 01/14/2019 1214   BUN 13 03/12/2018 1238   CREATININE 0.63 01/14/2019 1214   CALCIUM 9.7 01/14/2019 1214   PROT 6.9 01/14/2019 1214   PROT 6.7 03/12/2018 1238   ALBUMIN 4.2 01/14/2019 1214   ALBUMIN 4.1 03/12/2018 1238   AST 12 01/14/2019 1214   ALT 9 01/14/2019 1214   ALKPHOS 54 01/14/2019 1214   BILITOT 0.3 01/14/2019 1214   BILITOT <0.2 03/12/2018 1238   GFRNONAA 98 03/12/2018 1238   GFRAA 113 03/12/2018 1238   No results found for: CHOL, HDL, LDLCALC, LDLDIRECT, TRIG, CHOLHDL  Lab Results  Component Value Date   HGBA1C (H) 07/16/2010    6.3 (NOTE)                                                                       According to the ADA Clinical Practice Recommendations for 2011, when HbA1c is used as a screening test:   >=6.5%   Diagnostic of Diabetes Mellitus           (if abnormal result  is confirmed)  5.7-6.4%   Increased risk of developing Diabetes Mellitus  References:Diagnosis and Classification of Diabetes Mellitus,Diabetes [id number] 1):S62-S69 and Standards of Medical Care in         Diabetes - 2011,Diabetes Care,2011,34  (Suppl 1):S11-S61.   No results found for: VITAMINB12 Lab Results  Component Value Date   TSH 0.329 (L) 07/16/2010    ASSESSMENT AND PLAN 68 y.o. year old female  has a past medical history of Anemia, Asthma, Cervicogenic headache (08/20/2015), Chicken pox, Depression, Family history of colon cancer, Family history of ovarian cancer, Family history of pancreatic cancer, Family history of stomach cancer, Family history of uterine cancer, Hypertension, and Seizures ([place]) (1). here with:  1.  History of seizures well-controlled  She will continue taking  Keppra and Depakote.  She has not had recurrent seizure in nearly 10 years.  I will check routine lab work today.  She does not need a refill on her medications. Her BP was elevated today. She is to keep a check on this, but she reports she gets nervous when coming to the doctor. She will follow-up in 1 year or sooner if needed.  She will call for recurrent seizure.  I spent 15 minutes with the patient. 50% of this time was spent discussing her plan of care.  Butler Denmark, AGNP-C, DNP 09/17/2019, 2:14 PM [facility] [address] [place], [place] [address] [phone]

## 2019-09-17 ENCOUNTER — Other Ambulatory Visit: Payer: Self-pay

## 2019-09-17 ENCOUNTER — Ambulatory Visit: Payer: Medicare Other | Admitting: Neurology

## 2019-09-17 ENCOUNTER — Encounter: Payer: Self-pay | Admitting: Neurology

## 2019-09-17 VITALS — BP 172/92 | HR 66 | Temp 97.9°F | Ht 59.0 in | Wt 135.0 lb

## 2019-09-17 DIAGNOSIS — G40409 Other generalized epilepsy and epileptic syndromes, not intractable, without status epilepticus: Secondary | ICD-10-CM

## 2019-09-17 NOTE — Patient Instructions (Addendum)
1. Continue current medications  2. I will check lab work today 3. Return in 1 year

## 2019-09-17 NOTE — Progress Notes (Signed)
I have read the note, and I agree with the clinical assessment and plan.  Diora Bellizzi K Sabriel Borromeo   

## 2019-09-18 ENCOUNTER — Telehealth: Payer: Self-pay

## 2019-09-18 LAB — COMPREHENSIVE METABOLIC PANEL
ALT: 14 IU/L (ref 0–32)
AST: 19 IU/L (ref 0–40)
Albumin/Globulin Ratio: 1.4 (ref 1.2–2.2)
Albumin: 4.1 g/dL (ref 3.8–4.8)
Alkaline Phosphatase: 57 IU/L (ref 39–117)
BUN/Creatinine Ratio: 13 (ref 12–28)
BUN: 9 mg/dL (ref 8–27)
Bilirubin Total: 0.2 mg/dL (ref 0.0–1.2)
CO2: 26 mmol/L (ref 20–29)
Calcium: 9.7 mg/dL (ref 8.7–10.3)
Chloride: 98 mmol/L (ref 96–106)
Creatinine, Ser: 0.69 mg/dL (ref 0.57–1.00)
GFR calc Af Amer: 105 mL/min/{1.73_m2} (ref >59)
GFR calc non Af Amer: 91 mL/min/{1.73_m2} (ref >59)
Globulin, Total: 3 g/dL (ref 1.5–4.5)
Glucose: 107 mg/dL — ABNORMAL HIGH (ref 65–99)
Potassium: 4.6 mmol/L (ref 3.5–5.2)
Sodium: 139 mmol/L (ref 134–144)
Total Protein: 7.1 g/dL (ref 6.0–8.5)

## 2019-09-18 LAB — CBC WITH DIFFERENTIAL/PLATELET
Basophils Absolute: 0 10*3/uL (ref 0.0–0.2)
Basos: 1 %
EOS (ABSOLUTE): 0.1 10*3/uL (ref 0.0–0.4)
Eos: 2 %
Hematocrit: 39.6 % (ref 34.0–46.6)
Hemoglobin: 13.4 g/dL (ref 11.1–15.9)
Immature Grans (Abs): 0 10*3/uL (ref 0.0–0.1)
Immature Granulocytes: 0 %
Lymphocytes Absolute: 2.8 10*3/uL (ref 0.7–3.1)
Lymphs: 51 %
MCH: 30.7 pg (ref 26.6–33.0)
MCHC: 33.8 g/dL (ref 31.5–35.7)
MCV: 91 fL (ref 79–97)
Monocytes Absolute: 0.6 10*3/uL (ref 0.1–0.9)
Monocytes: 12 %
Neutrophils Absolute: 1.9 10*3/uL (ref 1.4–7.0)
Neutrophils: 34 %
Platelets: 311 10*3/uL (ref 150–450)
RBC: 4.37 x10E6/uL (ref 3.77–5.28)
RDW: 12.2 % (ref 11.7–15.4)
WBC: 5.4 10*3/uL (ref 3.4–10.8)

## 2019-09-18 LAB — VALPROIC ACID LEVEL: Valproic Acid Lvl: 102 ug/mL — ABNORMAL HIGH (ref 50–100)

## 2019-09-18 NOTE — Telephone Encounter (Signed)
Spoke with the patient and she verbalized understanding her results. No questions or concerns at this time.   

## 2019-10-01 ENCOUNTER — Other Ambulatory Visit: Payer: Self-pay

## 2019-10-01 ENCOUNTER — Telehealth (INDEPENDENT_AMBULATORY_CARE_PROVIDER_SITE_OTHER): Payer: Medicare Other | Admitting: Family Medicine

## 2019-10-01 DIAGNOSIS — R0981 Nasal congestion: Secondary | ICD-10-CM | POA: Diagnosis not present

## 2019-10-01 DIAGNOSIS — Z20828 Contact with and (suspected) exposure to other viral communicable diseases: Secondary | ICD-10-CM

## 2019-10-01 DIAGNOSIS — R059 Cough, unspecified: Secondary | ICD-10-CM

## 2019-10-01 DIAGNOSIS — R05 Cough: Secondary | ICD-10-CM | POA: Diagnosis not present

## 2019-10-01 DIAGNOSIS — Z20822 Contact with and (suspected) exposure to covid-19: Secondary | ICD-10-CM

## 2019-10-01 NOTE — Patient Instructions (Signed)
Follow up: if worsening, new concerns, positive test or if not improving over the next several days.  Can try one tablet of claritin or zyrtec daily.  Nasal saline.  Self Isolation/Home Quarantine: -see the CDC site for information:   RunningShows.co.za.html   -STAY HOME except for to seek medical care -stay in your own room away from others in your house and use a separate bathroom if possible -Wash hands frequently, disinfect high touch surface areas often, wear a mask if you leave your room and interact as little as possible with others -seek medical care immediately if worsening - call our office for a visit or call ahead if going elsewhere to an urgent care  -seek emergency care if very sick or severe symptoms - call 911 -isolate for at least 10 days from the onset of symptoms PLUS 1 day of no fever PLUS 1 day of improving symptoms  We recommend wearing a mask, social distancing, good hand hygiene and asking others  around you to do the same at all times when around others outside of your home unit throughout the [place] pandemic.    Novel Coronavirus Testing: I sent an order for coronavirus testing. No Appointment is needed.  Testing Sites:    [facility] Location:                            [address] [place] (old [facility]) Hours:                                 8a-3:45p, M-F  [facility] Location:                           [address] [place], [place] [address]                                                              [place] ([place]) Hours:                                 8a-3:45p, M-F  [facility] Location:                            [facility]ician, dispensing (across from [place]) Hours:                                 8a-3:45p, M-F  Positive test. These tests are not 100% perfect, but if you  tested positive for COVID-19, this confirms that you have contracted the SARS-CoV-2 virus. STAY HOME to complete full Quarantine per CDC guidelines.  Negative test. These tests are not 100% perfect but if you tested negative for COVID-19, this indicates that you may  not have contracted the SARS-CoV-2 virus. Follow your doctor's recommendations and the CDC guidelines.

## 2019-10-01 NOTE — Progress Notes (Signed)
Virtual Visit via Telephone Note  I connected with Glenda Francis on 10/01/19 at  3:00 PM EST by telephone and verified that I am speaking with the correct person using two identifiers.   I discussed the limitations, risks, security and privacy concerns of performing an evaluation and management service by telephone and the availability of in person appointments. I also discussed with the patient that there may be a patient responsible charge related to this service. The patient expressed understanding and agreed to proceed.  Location patient: home Location provider: work or home office Participants present for the call: patient, provider Patient did not have a visit in the prior 7 days to address this/these issue(s).   History of Present Illness:  Acute visit for nasal congestion: -started 4 days ago -symptoms include: nasal congestion, little bit of sore throat, a little ear discomfort, lots of sneezing, itchy watery eyes, cough -denies fevers, body aches, NVD, loss of taste, SOB -she gets a lot of allergies this time of the year and this feels the same -no known sick exposure, no known, COVID exposures, denies any exposure to anyone outside of her home    Observations/Objective: Patient sounds cheerful and well on the phone. I do not appreciate any SOB. Speech and thought processing are grossly intact. Patient reported vitals:  Assessment and Plan:  Cough  Nasal congestion  Suspected COVID-19 virus infection - Plan: Novel Coronavirus, NAA ([facility])  -we discussed possible serious and likely etiologies, options for evaluation and workup, limitations of telemedicine visit vs in person visit, treatment, treatment risks and precautions. Pt prefers to treat via telemedicine empirically rather then risking or undertaking an in person visit at this moment. Discussed options for symptomatic care and for allergies (allegra, claritin or zyrtec, nasal saline) for possible allergies vs VURI  most likely. COVID19 also possible, but seems less likely. She wishes to do testing - orders, limitations, options discussed. Also, she is agreeable to home self isolation. Patient agrees to seek prompt in person care if worsening, new symptoms arise, or if is not improving with treatment.  Follow Up Instructions:  I did not refer this patient for an OV in the next 24 hours for this/these issue(s).  I discussed the assessment and treatment plan with the patient. The patient was provided an opportunity to ask questions and all were answered. The patient agreed with the plan and demonstrated an understanding of the instructions.   The patient was advised to call back or seek an in-person evaluation if the symptoms worsen or if the condition fails to improve as anticipated.  I provided15 minutes of non-face-to-face time during this encounter.   Lucretia Kern, DO   Patient Instructions  Follow up: if worsening, new concerns, positive test or if not improving over the next several days.  Can try one tablet of claritin or zyrtec daily.  Nasal saline.  Self Isolation/Home Quarantine: -see the CDC site for information:   RunningShows.co.za.html   -STAY HOME except for to seek medical care -stay in your own room away from others in your house and use a separate bathroom if possible -Wash hands frequently, disinfect high touch surface areas often, wear a mask if you leave your room and interact as little as possible with others -seek medical care immediately if worsening - call our office for a visit or call ahead if going elsewhere to an urgent care  -seek emergency care if very sick or severe symptoms - call 911 -isolate for at least 10  days from the onset of symptoms PLUS 1 day of no fever PLUS 1 day of improving symptoms  We recommend wearing a mask, social distancing, good hand hygiene and asking others  around you to do the same at  all times when around others outside of your home unit throughout the [place] pandemic.    Novel Coronavirus Testing: I sent an order for coronavirus testing. No Appointment is needed.  Testing Sites:    [facility] Location:                            [address] [place] (old [facility]) Hours:                                 8a-3:45p, M-F  [facility] Location:                           [address] [place], [place] [address]                                                              [place] ([place]) Hours:                                 8a-3:45p, M-F  [facility] Location:                            Optician, dispensing (across from [place]) Hours:                                 8a-3:45p, M-F  Positive test. These tests are not 100% perfect, but if you tested positive for COVID-19, this confirms that you have contracted the SARS-CoV-2 virus. STAY HOME to complete full Quarantine per CDC guidelines.  Negative test. These tests are not 100% perfect but if you tested negative for COVID-19, this indicates that you may not have contracted the SARS-CoV-2 virus. Follow your doctor's recommendations and the CDC guidelines.

## 2019-10-02 ENCOUNTER — Other Ambulatory Visit: Payer: Self-pay

## 2019-10-02 DIAGNOSIS — Z20822 Contact with and (suspected) exposure to covid-19: Secondary | ICD-10-CM

## 2019-10-03 LAB — NOVEL CORONAVIRUS, NAA: SARS-CoV-2, NAA: NOT DETECTED

## 2019-10-22 ENCOUNTER — Other Ambulatory Visit: Payer: Self-pay | Admitting: *Deleted

## 2019-10-22 MED ORDER — LEVETIRACETAM 500 MG PO TABS: 1000.0000 mg | ORAL_TABLET | Freq: Two times a day (BID) | ORAL | 1 refills | Status: DC

## 2019-10-28 ENCOUNTER — Other Ambulatory Visit: Payer: Self-pay

## 2019-10-29 ENCOUNTER — Other Ambulatory Visit: Payer: Self-pay

## 2019-10-29 MED ORDER — LOSARTAN POTASSIUM 100 MG PO TABS: 100.0000 mg | ORAL_TABLET | Freq: Every day | ORAL | 1 refills | Status: DC

## 2019-12-02 ENCOUNTER — Ambulatory Visit: Payer: Medicare HMO | Attending: Internal Medicine

## 2019-12-02 DIAGNOSIS — Z20822 Contact with and (suspected) exposure to covid-19: Secondary | ICD-10-CM | POA: Diagnosis not present

## 2019-12-03 LAB — NOVEL CORONAVIRUS, NAA: SARS-CoV-2, NAA: DETECTED — AB

## 2019-12-04 ENCOUNTER — Telehealth: Payer: Self-pay | Admitting: Nurse Practitioner

## 2019-12-04 NOTE — Telephone Encounter (Signed)
Called to Discuss with patient about Covid symptoms and the use of bamlanivimab, a monoclonal antibody infusion for those with mild to moderate Covid symptoms and at a high risk of hospitalization.     Pt is qualified for this infusion at the [facility] due to co-morbid conditions and/or a member of an at-risk group.     Unable to reach pt  

## 2019-12-05 ENCOUNTER — Other Ambulatory Visit: Payer: Self-pay

## 2019-12-05 ENCOUNTER — Telehealth (INDEPENDENT_AMBULATORY_CARE_PROVIDER_SITE_OTHER): Payer: Medicare HMO | Admitting: Family Medicine

## 2019-12-05 DIAGNOSIS — U071 COVID-19: Secondary | ICD-10-CM | POA: Diagnosis not present

## 2019-12-05 NOTE — Progress Notes (Signed)
Virtual Visit via Telephone Note  I connected with the patient on 12/05/19 at 11:30 AM EST by telephone and verified that I am speaking with the correct person using two identifiers.   I discussed the limitations, risks, security and privacy concerns of performing an evaluation and management service by telephone and the availability of in person appointments. I also discussed with the patient that there may be a patient responsible charge related to this service. The patient expressed understanding and agreed to proceed.  Location patient: home Location provider: work or home office Participants present for the call: patient, provider Patient did not have a visit in the prior 7 days to address this/these issue(s).   History of Present Illness: Here asking for help for chest congestion and a dry cough. She has been sick for one week, and on 12-02-19 she tested positive for the Covid-19 virus (along with her daughter). She has had stuffy head, chest congestion and the cough, along with a mild headache and some body aches. No SOB or chest pain. No NVD. No fever. She is drinking fluids and taking Alka Seltzer Plus.    Observations/Objective: Patient sounds cheerful and well on the phone. I do not appreciate any SOB. Speech and thought processing are grossly intact. Patient reported vitals:  Assessment and Plan: Covid-19 infection with chest congestion. She can use Mucinex DM twice a day. Recheck as needed. Alysia Penna, MD   Follow Up Instructions:     [id number] 5-10 [phone] 11-20 9443 21-30 I did not refer this patient for an OV in the next 24 hours for this/these issue(s).  I discussed the assessment and treatment plan with the patient. The patient was provided an opportunity to ask questions and all were answered. The patient agreed with the plan and demonstrated an understanding of the instructions.   The patient was advised to call back or seek an in-person evaluation if the symptoms  worsen or if the condition fails to improve as anticipated.  I provided  14 minutes of non-face-to-face time during this encounter.   Alysia Penna, MD

## 2019-12-19 ENCOUNTER — Ambulatory Visit: Payer: Self-pay

## 2019-12-19 ENCOUNTER — Telehealth: Payer: Self-pay | Admitting: Family Medicine

## 2019-12-19 MED ORDER — METRONIDAZOLE 500 MG PO TABS: 500.0000 mg | ORAL_TABLET | Freq: Two times a day (BID) | ORAL | 0 refills | Status: DC

## 2019-12-19 MED ORDER — CIPROFLOXACIN HCL 500 MG PO TABS: 500.0000 mg | ORAL_TABLET | Freq: Two times a day (BID) | ORAL | 0 refills | Status: DC

## 2019-12-19 NOTE — Telephone Encounter (Signed)
Call in Cipro 500 mg bid for 10 days and Flagyl 500 mg bid for 10 days

## 2019-12-19 NOTE — Telephone Encounter (Signed)
Ria Comment can you please have Dr. Sarajane Jews take a look, pcp is out of the office. Thanks

## 2019-12-19 NOTE — Addendum Note (Signed)
Addended by: Rebecca Eaton on: 12/19/2019 12:27 PM   Modules accepted: Orders

## 2019-12-19 NOTE — Telephone Encounter (Signed)
Pt. Called; c/o left lower quad. Abdominal pain with radiation to the left lower back.  Onset last evening.  Awakened during night with severe pain; pain comes and goes.  Rated 8/10.  C/o some nausea and has had chills.  Denied vomiting.  Denied change in bowel movements; reported having normal stools.  Stated she has had hx of diverticulitis and usually is prescribed antibiotics right away.  Advised she may need to go to ER due to severity of pain.  Declined the recommendation for ER, stating, "I've gone to Dr. Elease Hashimoto for years, and he has always just prescribed antibiotics for this."  Called FC at office.  Reported Dr. Elease Hashimoto is out of office today.  Advised pt. May need to go to UC, if she does not want to go to ER.  Questioned if another MD could evaluate her today.  Was advised to send note to office for review.  Advised pt. She may need to seek eval. At [facility].  Pt. Verb. Her dissatisfaction about being sent to UC.  Informed her of plan to have MD at office review Triage note, to make further recommendations.  Encouraged her to rest, sip on fluids, and to await call back from office.  Pt. Verb. Understanding. Reason for Disposition . [1] SEVERE pain AND [2] age > 8    C/o onset left lower quad. Abdominal pain last evening, with radiation to left lower back, that comes and goes.  Stated she has a hx of diverticulitis.  Pt. does not want to go to the ER.  Answer Assessment - Initial Assessment Questions 1. LOCATION: "Where does it hurt?"      Left lower quadrant and left lower back  2. RADIATION: "Does the pain shoot anywhere else?" (e.g., chest, back)     Radiates from front to back  3. ONSET: "When did the pain begin?" (e.g., minutes, hours or days ago)      Last evening 4. SUDDEN: "Gradual or sudden onset?"     Sudden onset with worsening of pain during night 5. PATTERN "Does the pain come and go, or is it constant?"    - If constant: "Is it getting better, staying the same, or  worsening?"      (Note: Constant means the pain never goes away completely; most serious pain is constant and it progresses)     - If intermittent: "How long does it last?" "Do you have pain now?"     (Note: Intermittent means the pain goes away completely between bouts)     Comes and goes 6. SEVERITY: "How bad is the pain?"  (e.g., Scale 1-10; mild, moderate, or severe)   - MILD (1-3): doesn't interfere with normal activities, abdomen soft and not tender to touch    - MODERATE (4-7): interferes with normal activities or awakens from sleep, tender to touch    - SEVERE (8-10): excruciating pain, doubled over, unable to do any normal activities      8/10 7. RECURRENT SYMPTOM: "Have you ever had this type of abdominal pain before?" If so, ask: "When was the last time?" and "What happened that time?"      Yes- hx of diverticulitis 8. CAUSE: "What do you think is causing the abdominal pain?"     diverticulitis 9. RELIEVING/AGGRAVATING FACTORS: "What makes it better or worse?" (e.g., movement, antacids, bowel movement)     Ice application 10. OTHER SYMPTOMS: "Has there been any vomiting, diarrhea, constipation, or urine problems?"  Felt a little chilled, some nausea, no change in bowel movements. 11. PREGNANCY: "Is there any chance you are pregnant?" "When was your last menstrual period?"      N/a  Protocols used: ABDOMINAL PAIN - [facility]

## 2019-12-19 NOTE — Telephone Encounter (Signed)
Rx sent in. Patient is aware.  

## 2020-01-02 NOTE — Telephone Encounter (Signed)
Disregard

## 2020-01-02 NOTE — Telephone Encounter (Signed)
disregard

## 2020-01-29 ENCOUNTER — Encounter: Payer: Self-pay | Admitting: Family Medicine

## 2020-01-29 ENCOUNTER — Other Ambulatory Visit: Payer: Self-pay

## 2020-01-29 ENCOUNTER — Ambulatory Visit (INDEPENDENT_AMBULATORY_CARE_PROVIDER_SITE_OTHER): Payer: Medicare Other | Admitting: Family Medicine

## 2020-01-29 VITALS — BP 118/70 | HR 77 | Temp 97.7°F | Ht 59.0 in | Wt 131.4 lb

## 2020-01-29 DIAGNOSIS — I10 Essential (primary) hypertension: Secondary | ICD-10-CM | POA: Diagnosis not present

## 2020-01-29 DIAGNOSIS — R1032 Left lower quadrant pain: Secondary | ICD-10-CM | POA: Diagnosis not present

## 2020-01-29 DIAGNOSIS — U071 COVID-19: Secondary | ICD-10-CM

## 2020-01-29 LAB — CBC WITH DIFFERENTIAL/PLATELET
Basophils Absolute: 0 10*3/uL (ref 0.0–0.1)
Basophils Relative: 0.8 % (ref 0.0–3.0)
Eosinophils Absolute: 0.1 10*3/uL (ref 0.0–0.7)
Eosinophils Relative: 2 % (ref 0.0–5.0)
HCT: 39.4 % (ref 36.0–46.0)
Hemoglobin: 13.4 g/dL (ref 12.0–15.0)
Lymphocytes Relative: 45.8 % (ref 12.0–46.0)
Lymphs Abs: 3 10*3/uL (ref 0.7–4.0)
MCHC: 34 g/dL (ref 30.0–36.0)
MCV: 89.6 fl (ref 78.0–100.0)
Monocytes Absolute: 0.7 10*3/uL (ref 0.1–1.0)
Monocytes Relative: 10.1 % (ref 3.0–12.0)
Neutro Abs: 2.7 10*3/uL (ref 1.4–7.7)
Neutrophils Relative %: 41.3 % — ABNORMAL LOW (ref 43.0–77.0)
Platelets: 273 10*3/uL (ref 150.0–400.0)
RBC: 4.39 Mil/uL (ref 3.87–5.11)
RDW: 12.8 % (ref 11.5–15.5)
WBC: 6.6 10*3/uL (ref 4.0–10.5)

## 2020-01-29 LAB — LIPID PANEL
Cholesterol: 217 mg/dL — ABNORMAL HIGH (ref 0–200)
HDL: 52.9 mg/dL (ref >39.00)
LDL Cholesterol: 124 mg/dL — ABNORMAL HIGH (ref 0–99)
NonHDL: 163.61
Total CHOL/HDL Ratio: 4
Triglycerides: 200 mg/dL — ABNORMAL HIGH (ref 0.0–149.0)
VLDL: 40 mg/dL (ref 0.0–40.0)

## 2020-01-29 NOTE — Patient Instructions (Signed)
Diverticulitis  Diverticulitis is infection or inflammation of small pouches (diverticula) in the colon that form due to a condition called diverticulosis. Diverticula can trap stool (feces) and bacteria, causing infection and inflammation. Diverticulitis may cause severe stomach pain and diarrhea. It may lead to tissue damage in the colon that causes bleeding. The diverticula may also burst (rupture) and cause infected stool to enter other areas of the abdomen. Complications of diverticulitis can include:  Bleeding.  Severe infection.  Severe pain.  Rupture (perforation) of the colon.  Blockage (obstruction) of the colon. What are the causes? This condition is caused by stool becoming trapped in the diverticula, which allows bacteria to grow in the diverticula. This leads to inflammation and infection. What increases the risk? You are more likely to develop this condition if:  You have diverticulosis. The risk for diverticulosis increases if: ? You are overweight or obese. ? You use tobacco products. ? You do not get enough exercise.  You eat a diet that does not include enough fiber. High-fiber foods include fruits, vegetables, beans, nuts, and whole grains. What are the signs or symptoms? Symptoms of this condition may include:  Pain and tenderness in the abdomen. The pain is normally located on the left side of the abdomen, but it may occur in other areas.  Fever and chills.  Bloating.  Cramping.  Nausea.  Vomiting.  Changes in bowel routines.  Blood in your stool. How is this diagnosed? This condition is diagnosed based on:  Your medical history.  A physical exam.  Tests to make sure there is nothing else causing your condition. These tests may include: ? Blood tests. ? Urine tests. ? Imaging tests of the abdomen, including X-rays, ultrasounds, MRIs, or CT scans. How is this treated? Most cases of this condition are mild and can be treated at home.  Treatment may include:  Taking over-the-counter pain medicines.  Following a clear liquid diet.  Taking antibiotic medicines by mouth.  Rest. More severe cases may need to be treated at a hospital. Treatment may include:  Not eating or drinking.  Taking prescription pain medicine.  Receiving antibiotic medicines through an IV tube.  Receiving fluids and nutrition through an IV tube.  Surgery. When your condition is under control, your health care provider may recommend that you have a colonoscopy. This is an exam to look at the entire large intestine. During the exam, a lubricated, bendable tube is inserted into the anus and then passed into the rectum, colon, and other parts of the large intestine. A colonoscopy can show how severe your diverticula are and whether something else may be causing your symptoms. Follow these instructions at home: Medicines  Take over-the-counter and prescription medicines only as told by your health care provider. These include fiber supplements, probiotics, and stool softeners.  If you were prescribed an antibiotic medicine, take it as told by your health care provider. Do not stop taking the antibiotic even if you start to feel better.  Do not drive or use heavy machinery while taking prescription pain medicine. General instructions   Follow a full liquid diet or another diet as directed by your health care provider. After your symptoms improve, your health care provider may tell you to change your diet. He or she may recommend that you eat a diet that contains at least 25 g (25 grams) of fiber daily. Fiber makes it easier to pass stool. Healthy sources of fiber include: ? Berries. One cup contains 4-8 grams of   fiber. ? Beans or lentils. One half cup contains 5-8 grams of fiber. ? Green vegetables. One cup contains 4 grams of fiber.  Exercise for at least 30 minutes, 3 times each week. You should exercise hard enough to raise your heart rate and  break a sweat.  Keep all follow-up visits as told by your health care provider. This is important. You may need a colonoscopy. Contact a health care provider if:  Your pain does not improve.  You have a hard time drinking or eating food.  Your bowel movements do not return to normal. Get help right away if:  Your pain gets worse.  Your symptoms do not get better with treatment.  Your symptoms suddenly get worse.  You have a fever.  You vomit more than one time.  You have stools that are bloody, black, or tarry. Summary  Diverticulitis is infection or inflammation of small pouches (diverticula) in the colon that form due to a condition called diverticulosis. Diverticula can trap stool (feces) and bacteria, causing infection and inflammation.  You are at higher risk for this condition if you have diverticulosis and you eat a diet that does not include enough fiber.  Most cases of this condition are mild and can be treated at home. More severe cases may need to be treated at a hospital.  When your condition is under control, your health care provider may recommend that you have an exam called a colonoscopy. This exam can show how severe your diverticula are and whether something else may be causing your symptoms. This information is not intended to replace advice given to you by your health care provider. Make sure you discuss any questions you have with your health care provider. Document Revised: 10/27/2017 Document Reviewed: 12/17/2016 Elsevier Patient Education  2020 Elsevier Inc.  

## 2020-01-29 NOTE — Progress Notes (Signed)
Subjective:     Patient ID: Glenda Francis, female   DOB: 20-Nov-1951, 69 y.o.   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  She feels like she is recovered from the Covid at this time.  Her symptoms are relatively mild.  She did have virtual follow-up for possible acute diverticulitis.  She developed some left lower quadrant abdominal pain.  She was treated with antibiotics and is improved but still has some mild residual left lower quadrant pain.  Her virtual visit was January 7.  She has lost some weight due to her efforts.  She is made some dietary changes.  She has good appetite.  No bloody stools.  She has pretty much pan diverticulosis changes to the colon.  Her last colonoscopy was October 2019 and this was reviewed.  She had benign polyp.  She states her bowel movements are normal in terms of consistency.  She did have some diarrhea with Covid that is very transient.  None since then.  She has hypertension which is treated with losartan has been stable.  No recent dizziness.  She did have some lightheadedness with Covid infection.  She had valproic acid level along with CBC and comprehensive metabolic panel per neurology back in October and those results were reviewed.  She is requesting lipid panel.  She has not had one in years.  Past Medical History:  Diagnosis Date  . Anemia    as a child  . Asthma    childhood  . Cervicogenic headache 08/20/2015   Left side  . Chicken pox   . Depression    when son passed away  . Family history of colon cancer   . Family history of ovarian cancer   . Family history of pancreatic cancer   . Family history of stomach cancer   . Family history of uterine cancer   . Hypertension   . Seizures ([place]) 1   started in 07/2008/ last seizure was in 2012   Past Surgical History:  Procedure Laterality Date  . ABDOMINAL HYSTERECTOMY  1989   partial, menorrhagia  . CATARACT  EXTRACTION Bilateral 10/26/2015, 12-04/2015   laser  . COLONOSCOPY      reports that she has quit smoking. She has never used smokeless tobacco. She reports current alcohol use of about 7.0 standard drinks of alcohol per week. She reports that she does not use drugs. family history includes Asthma in an other family member; Cancer in her brother, brother, maternal aunt, maternal aunt, and mother; Colon cancer in her brother, brother, cousin, and another family member; Hyperlipidemia in an other family member; Other in her sister; Ovarian cancer in her maternal aunt, maternal aunt, mother, and another family member; Pancreatic cancer in her brother; Uterine cancer in her maternal aunt, maternal aunt, mother, and another family member. No Known Allergies   Review of Systems  Constitutional: Negative for fatigue.  Eyes: Negative for visual disturbance.  Respiratory: Negative for cough, chest tightness, shortness of breath and wheezing.   Cardiovascular: Negative for chest pain, palpitations and leg swelling.  Gastrointestinal: Positive for abdominal pain. Negative for abdominal distention, anal bleeding, blood in stool, constipation, diarrhea, nausea and vomiting.  Genitourinary: Negative for dysuria.  Neurological: Negative for dizziness, seizures, syncope, weakness, light-headedness and headaches.       Objective:   Physical Exam Constitutional:      Appearance: She is well-developed.  Eyes:     Pupils: Pupils  are equal, round, and reactive to light.  Neck:     Thyroid: No thyromegaly.     Vascular: No JVD.  Cardiovascular:     Rate and Rhythm: Normal rate and regular rhythm.     Heart sounds: No gallop.   Pulmonary:     Effort: Pulmonary effort is normal. No respiratory distress.     Breath sounds: Normal breath sounds. No wheezing or rales.  Abdominal:     Comments: Abdomen is nondistended.  Normal bowel sounds.  Soft with minimal tenderness left lower quadrant to deep palpation.   No masses palpated.  Musculoskeletal:     Cervical back: Neck supple.  Neurological:     Mental Status: She is alert.        Assessment:     #1 recent COVID-19 infection back in January.  She is symptomatically improved at this time.  No major residual symptoms  #2 history of presumed acute diverticulitis back in January treat with antibiotics.  She has some residual mild left lower quadrant pain but has not had any fever  #3 hypertension which is stable on losartan and well-controlled    Plan:     -Check CBC with differential -Check lipid panel -Continue losartan -Follow-up immediately for any abdominal distention, recurrent vomiting, fever, or worsening abdominal pain.  We explained that we would like to try to avoid frequent CT scans to avoid excessive radiation in the absence of any symptoms above, elevated white count, or fever would favor observation for now.  Her colonoscopy is up-to-date as above  Eulas Post MD [facility] at [facility]

## 2020-03-07 ENCOUNTER — Ambulatory Visit: Payer: Medicare Other | Attending: Internal Medicine

## 2020-03-07 DIAGNOSIS — Z23 Encounter for immunization: Secondary | ICD-10-CM

## 2020-03-07 NOTE — Progress Notes (Signed)
   Covid-19 Vaccination Clinic  Name:  Glenda Francis    MRN: [id number] DOB: 06/17/1951  03/07/2020  [name] was observed post Covid-19 immunization for 15 minutes without incident. She was provided with Vaccine Information Sheet and instruction to access the V-Safe system.   [name] was instructed to call 911 with any severe reactions post vaccine: Marland Kitchen Difficulty breathing  . Swelling of face and throat  . A fast heartbeat  . A bad rash all over body  . Dizziness and weakness   Immunizations Administered    Name Date Dose VIS Date Route   Pfizer COVID-19 Vaccine 03/07/2020  1:01 PM 0.3 mL 11/08/2019 Intramuscular   Manufacturer: [employer]   Lot: [id number]   [place]: [id number]

## 2020-03-10 ENCOUNTER — Emergency Department (HOSPITAL_COMMUNITY)
Admission: EM | Admit: 2020-03-10 | Discharge: 2020-03-10 | Disposition: A | Discharge status: Home / Self Care | Payer: Medicare Other | Attending: Emergency Medicine | Admitting: Emergency Medicine

## 2020-03-10 ENCOUNTER — Telehealth (INDEPENDENT_AMBULATORY_CARE_PROVIDER_SITE_OTHER): Payer: Medicare Other | Admitting: Family Medicine

## 2020-03-10 ENCOUNTER — Encounter (HOSPITAL_COMMUNITY): Payer: Self-pay

## 2020-03-10 ENCOUNTER — Other Ambulatory Visit: Payer: Self-pay

## 2020-03-10 DIAGNOSIS — R109 Unspecified abdominal pain: Secondary | ICD-10-CM

## 2020-03-10 DIAGNOSIS — R197 Diarrhea, unspecified: Secondary | ICD-10-CM | POA: Insufficient documentation

## 2020-03-10 DIAGNOSIS — Z5321 Procedure and treatment not carried out due to patient leaving prior to being seen by health care provider: Secondary | ICD-10-CM | POA: Insufficient documentation

## 2020-03-10 DIAGNOSIS — R1110 Vomiting, unspecified: Secondary | ICD-10-CM

## 2020-03-10 DIAGNOSIS — R112 Nausea with vomiting, unspecified: Secondary | ICD-10-CM | POA: Diagnosis not present

## 2020-03-10 LAB — CBC
HCT: 39.8 % (ref 36.0–46.0)
Hemoglobin: 13.6 g/dL (ref 12.0–15.0)
MCH: 30.6 pg (ref 26.0–34.0)
MCHC: 34.2 g/dL (ref 30.0–36.0)
MCV: 89.6 fL (ref 80.0–100.0)
Platelets: 290 K/uL (ref 150–400)
RBC: 4.44 MIL/uL (ref 3.87–5.11)
RDW: 12.2 % (ref 11.5–15.5)
WBC: 12.1 K/uL — ABNORMAL HIGH (ref 4.0–10.5)
nRBC: 0 % (ref 0.0–0.2)

## 2020-03-10 LAB — COMPREHENSIVE METABOLIC PANEL WITH GFR
ALT: 17 U/L (ref 0–44)
AST: 22 U/L (ref 15–41)
Albumin: 3.5 g/dL (ref 3.5–5.0)
Alkaline Phosphatase: 48 U/L (ref 38–126)
Anion gap: 11 (ref 5–15)
BUN: 15 mg/dL (ref 8–23)
CO2: 27 mmol/L (ref 22–32)
Calcium: 8.8 mg/dL — ABNORMAL LOW (ref 8.9–10.3)
Chloride: 96 mmol/L — ABNORMAL LOW (ref 98–111)
Creatinine, Ser: 0.74 mg/dL (ref 0.44–1.00)
GFR calc Af Amer: >60 mL/min
GFR calc non Af Amer: >60 mL/min
Glucose, Bld: 255 mg/dL — ABNORMAL HIGH (ref 70–99)
Potassium: 3.8 mmol/L (ref 3.5–5.1)
Sodium: 134 mmol/L — ABNORMAL LOW (ref 135–145)
Total Bilirubin: 0.5 mg/dL (ref 0.3–1.2)
Total Protein: 6.8 g/dL (ref 6.5–8.1)

## 2020-03-10 LAB — URINALYSIS, ROUTINE W REFLEX MICROSCOPIC
Bilirubin Urine: NEGATIVE
Glucose, UA: >=500 mg/dL — AB
Ketones, ur: 5 mg/dL — AB
Nitrite: NEGATIVE
Protein, ur: NEGATIVE mg/dL
Specific Gravity, Urine: 1.008 (ref 1.005–1.030)
pH: 6 (ref 5.0–8.0)

## 2020-03-10 LAB — LIPASE, BLOOD: Lipase: 32 U/L (ref 11–51)

## 2020-03-10 MED ORDER — SODIUM CHLORIDE 0.9% FLUSH: 3.0000 mL | Freq: Once | INTRAVENOUS | Status: DC

## 2020-03-10 NOTE — Progress Notes (Signed)
This visit type was conducted due to national recommendations for restrictions regarding the COVID-19 pandemic in an effort to limit this patient's exposure and mitigate transmission in our community.   Virtual Visit via Video Note  I connected with [name] on 03/10/20 at  4:30 PM EDT by a video enabled telemedicine application and verified that I am speaking with the correct person using two identifiers.  Location patient: home Location provider:work or home office Persons participating in the virtual visit: patient, provider  I discussed the limitations of evaluation and management by telemedicine and the availability of in person appointments. The patient expressed understanding and agreed to proceed.   HPI: Glenda Francis had episode last night of epigastric pain followed by few episodes of vomiting nonbloody emesis and one episode of diarrhea.  She had some upper abdominal cramps.  She did not note any melena.  No fever.  No chills.  No known sick contacts.  She reportedly had her first Covid vaccine on Sunday.  She had some soreness in her axilla and arm following the vaccine but no other side effects.  She went to the ER at [facility] and had labs which included mildly elevated white count of 12.1 thousand, normal lipase, basically normal chemistries.  Her urinalysis showed trace leukocytes.  She has had no urinary symptoms whatsoever.  She feels much better today overall.  She has kept down some clear liquids such as soups and fruits.  She has had no further vomiting and no diarrhea.  She had some mild cramps earlier but none currently.  Denies any right upper quadrant pain.   ROS: See pertinent positives and negatives per HPI.  Past Medical History:  Diagnosis Date  . Anemia    as a child  . Asthma    childhood  . Cervicogenic headache 08/20/2015   Left side  . Chicken pox   . Depression    when son passed away  . Family history of colon cancer   . Family history of ovarian  cancer   . Family history of pancreatic cancer   . Family history of stomach cancer   . Family history of uterine cancer   . Hypertension   . Seizures ([place]) 1   started in 07/2008/ last seizure was in 2012    Past Surgical History:  Procedure Laterality Date  . ABDOMINAL HYSTERECTOMY  1989   partial, menorrhagia  . CATARACT EXTRACTION Bilateral 10/26/2015, 12-04/2015   laser  . COLONOSCOPY      Family History  Problem Relation Age of Onset  . Cancer Mother        uterine,ovarian  . Uterine cancer Mother   . Ovarian cancer Mother   . Cancer Brother        colon-[address] 54, pancreatic  . Colon cancer Brother   . Pancreatic cancer Brother   . Cancer Brother        colon,stomach,brain  . Colon cancer Brother   . Other Sister        Pos for Lynch syndrome  . Colon cancer Other        died at 76  . Asthma Other   . Hyperlipidemia Other   . Cancer Maternal Aunt        uterine and ovarian  . Uterine cancer Maternal Aunt   . Ovarian cancer Maternal Aunt   . Cancer Maternal Aunt        ovarian and uterine  . Uterine cancer Maternal Aunt   .  Ovarian cancer Maternal Aunt   . Uterine cancer Other        died at 73  . Ovarian cancer Other   . Colon cancer Cousin        6 maternal cousins with colon cancer    SOCIAL HX: Non-smoker   Current Outpatient Medications:  .  Calcium Carbonate-Vitamin D (CALCIUM + D PO), Take 1 capsule by mouth daily., Disp: , Rfl:  .  divalproex (DEPAKOTE) 500 MG DR tablet, TAKE 1 TABLET (500 MG TOTAL) BY MOUTH 2 (TWO) TIMES DAILY., Disp: 180 tablet, Rfl: 3 .  levETIRAcetam (KEPPRA) 500 MG tablet, Take 2 tablets (1,000 mg total) by mouth every 12 (twelve) hours., Disp: 360 tablet, Rfl: 1 .  losartan (COZAAR) 100 MG tablet, Take 1 tablet (100 mg total) by mouth daily., Disp: 90 tablet, Rfl: 1 .  Multiple Vitamins-Minerals (MULTIVITAMIN PO), Take 1 capsule by mouth daily., Disp: , Rfl:  .  Multiple Vitamins-Minerals (ZINC PO), Take by mouth.,  Disp: , Rfl:   EXAM:  VITALS per patient if applicable:  GENERAL: alert, oriented, appears well and in no acute distress  HEENT: atraumatic, conjunttiva clear, no obvious abnormalities on inspection of external nose and ears  NECK: normal movements of the head and neck  LUNGS: on inspection no signs of respiratory distress, breathing rate appears normal, no obvious gross SOB, gasping or wheezing  CV: no obvious cyanosis  MS: moves all visible extremities without noticeable abnormality  PSYCH/NEURO: pleasant and cooperative, no obvious depression or anxiety, speech and thought processing grossly intact  ASSESSMENT AND PLAN:  Discussed the following assessment and plan:  Vomiting and diarrhea.  This was very acute onset yesterday and clinically much improved today.?  Viral.  Doubt related to Covid vaccine.  She had clinical infection with Covid back few months ago so recurrent Covid would seem very unlikely.  She is in no distress at this time and most of her labs were fairly reassuring.  She did have mildly elevated white count and this may have been some demargination related to her vomiting.  -We recommended continue clear liquids as tolerated and bland diet and follow for now.  Follow-up immediately for any fever, recurrent vomiting, melena, or other concerns     I discussed the assessment and treatment plan with the patient. The patient was provided an opportunity to ask questions and all were answered. The patient agreed with the plan and demonstrated an understanding of the instructions.   The patient was advised to call back or seek an in-person evaluation if the symptoms worsen or if the condition fails to improve as anticipated.     Carolann Littler, MD

## 2020-03-10 NOTE — ED Triage Notes (Signed)
Patient arrived stating she ate dinner last night and has since had upper mid abdominal pain, vomiting, and diarrhea. Reports pain feels like cramping that she has had diverticulitis in the past and was told to return for symptoms to check her gallbladder.

## 2020-04-01 ENCOUNTER — Ambulatory Visit: Payer: Medicare Other | Attending: Internal Medicine

## 2020-04-01 DIAGNOSIS — Z23 Encounter for immunization: Secondary | ICD-10-CM

## 2020-04-01 NOTE — Progress Notes (Signed)
   Covid-19 Vaccination Clinic  Name:  Glenda Francis    MRN: [id number] DOB: 1951/11/19  04/01/2020  Glenda Francis was observed post Covid-19 immunization for 15 minutes without incident. She was provided with Vaccine Information Sheet and instruction to access the V-Safe system.   Glenda Francis was instructed to call 911 with any severe reactions post vaccine: Marland Kitchen Difficulty breathing  . Swelling of face and throat  . A fast heartbeat  . A bad rash all over body  . Dizziness and weakness   Immunizations Administered    Name Date Dose VIS Date Route   Pfizer COVID-19 Vaccine 04/01/2020  1:04 PM 0.3 mL 01/22/2019 Intramuscular   Manufacturer: [place]   Lot: [id number]   [place]: [id number]

## 2020-04-13 ENCOUNTER — Telehealth: Payer: Self-pay | Admitting: Family Medicine

## 2020-04-13 MED ORDER — LOSARTAN POTASSIUM 100 MG PO TABS: 100.0000 mg | ORAL_TABLET | Freq: Every day | ORAL | 1 refills | Status: DC

## 2020-04-13 NOTE — Telephone Encounter (Signed)
Pt is calling needing a refill on losartan 100 MG would like to have 90 days.  Patient state that she is out.  Pharm:  [facility] on [address]

## 2020-04-13 NOTE — Telephone Encounter (Signed)
Refill sent in

## 2020-05-05 ENCOUNTER — Other Ambulatory Visit: Payer: Self-pay | Admitting: Neurology

## 2020-05-06 ENCOUNTER — Other Ambulatory Visit: Payer: Self-pay

## 2020-05-06 MED ORDER — LEVETIRACETAM 500 MG PO TABS: 1000.0000 mg | ORAL_TABLET | Freq: Two times a day (BID) | ORAL | 1 refills | Status: DC

## 2020-05-06 MED ORDER — DIVALPROEX SODIUM 500 MG PO DR TAB: DELAYED_RELEASE_TABLET | ORAL | 1 refills | Status: DC

## 2020-05-18 ENCOUNTER — Other Ambulatory Visit: Payer: Self-pay

## 2020-05-18 MED ORDER — DIVALPROEX SODIUM 500 MG PO DR TAB: DELAYED_RELEASE_TABLET | ORAL | 1 refills | Status: DC

## 2020-05-18 MED ORDER — LEVETIRACETAM 500 MG PO TABS: 1000.0000 mg | ORAL_TABLET | Freq: Two times a day (BID) | ORAL | 1 refills | Status: DC

## 2020-06-15 DIAGNOSIS — H1012 Acute atopic conjunctivitis, left eye: Secondary | ICD-10-CM | POA: Diagnosis not present

## 2020-06-15 DIAGNOSIS — H02839 Dermatochalasis of unspecified eye, unspecified eyelid: Secondary | ICD-10-CM | POA: Diagnosis not present

## 2020-06-15 DIAGNOSIS — H11001 Unspecified pterygium of right eye: Secondary | ICD-10-CM | POA: Diagnosis not present

## 2020-06-15 DIAGNOSIS — H11002 Unspecified pterygium of left eye: Secondary | ICD-10-CM | POA: Diagnosis not present

## 2020-07-30 DIAGNOSIS — D239 Other benign neoplasm of skin, unspecified: Secondary | ICD-10-CM | POA: Diagnosis not present

## 2020-07-30 DIAGNOSIS — L720 Epidermal cyst: Secondary | ICD-10-CM | POA: Diagnosis not present

## 2020-07-30 DIAGNOSIS — L738 Other specified follicular disorders: Secondary | ICD-10-CM | POA: Diagnosis not present

## 2020-08-24 DIAGNOSIS — Z1231 Encounter for screening mammogram for malignant neoplasm of breast: Secondary | ICD-10-CM | POA: Diagnosis not present

## 2020-08-24 DIAGNOSIS — M8588 Other specified disorders of bone density and structure, other site: Secondary | ICD-10-CM | POA: Diagnosis not present

## 2020-08-24 LAB — HM MAMMOGRAPHY

## 2020-09-09 ENCOUNTER — Other Ambulatory Visit: Payer: Self-pay

## 2020-09-09 ENCOUNTER — Ambulatory Visit (INDEPENDENT_AMBULATORY_CARE_PROVIDER_SITE_OTHER): Payer: Medicare Other | Admitting: Family Medicine

## 2020-09-09 VITALS — BP 140/82 | HR 84 | Temp 98.4°F | Ht 59.0 in | Wt 134.8 lb

## 2020-09-09 DIAGNOSIS — M8580 Other specified disorders of bone density and structure, unspecified site: Secondary | ICD-10-CM

## 2020-09-09 DIAGNOSIS — M5432 Sciatica, left side: Secondary | ICD-10-CM | POA: Diagnosis not present

## 2020-09-09 MED ORDER — PREDNISONE 10 MG PO TABS: ORAL_TABLET | ORAL | 0 refills | Status: DC

## 2020-09-09 NOTE — Patient Instructions (Addendum)
Sciatica Rehab Ask your health care provider which exercises are safe for you. Do exercises exactly as told by your health care provider and adjust them as directed. It is normal to feel mild stretching, pulling, tightness, or discomfort as you do these exercises. Stop right away if you feel sudden pain or your pain gets worse. Do not begin these exercises until told by your health care provider. Stretching and range-of-motion exercises These exercises warm up your muscles and joints and improve the movement and flexibility of your hips and back. These exercises also help to relieve pain, numbness, and tingling. Sciatic nerve glide 1. Sit in a chair with your head facing down toward your chest. Place your hands behind your back. Let your shoulders slump forward. 2. Slowly straighten one of your legs while you tilt your head back as if you are looking toward the ceiling. Only straighten your leg as far as you can without making your symptoms worse. 3. Hold this position for __________ seconds. 4. Slowly return to the starting position. 5. Repeat with your other leg. Repeat __________ times. Complete this exercise __________ times a day. Knee to chest with hip adduction and internal rotation  1. Lie on your back on a firm surface with both legs straight. 2. Bend one of your knees and move it up toward your chest until you feel a gentle stretch in your lower back and buttock. Then, move your knee toward the shoulder that is on the opposite side from your leg. This is hip adduction and internal rotation. ? Hold your leg in this position by holding on to the front of your knee. 3. Hold this position for __________ seconds. 4. Slowly return to the starting position. 5. Repeat with your other leg. Repeat __________ times. Complete this exercise __________ times a day. Prone extension on elbows  1. Lie on your abdomen on a firm surface. A bed may be too soft for this exercise. 2. Prop yourself up on  your elbows. 3. Use your arms to help lift your chest up until you feel a gentle stretch in your abdomen and your lower back. ? This will place some of your body weight on your elbows. If this is uncomfortable, try stacking pillows under your chest. ? Your hips should stay down, against the surface that you are lying on. Keep your hip and back muscles relaxed. 4. Hold this position for __________ seconds. 5. Slowly relax your upper body and return to the starting position. Repeat __________ times. Complete this exercise __________ times a day. Strengthening exercises These exercises build strength and endurance in your back. Endurance is the ability to use your muscles for a long time, even after they get tired. Pelvic tilt This exercise strengthens the muscles that lie deep in the abdomen. 1. Lie on your back on a firm surface. Bend your knees and keep your feet flat on the floor. 2. Tense your abdominal muscles. Tip your pelvis up toward the ceiling and flatten your lower back into the floor. ? To help with this exercise, you may place a small towel under your lower back and try to push your back into the towel. 3. Hold this position for __________ seconds. 4. Let your muscles relax completely before you repeat this exercise. Repeat __________ times. Complete this exercise __________ times a day. Alternating arm and leg raises  1. Get on your hands and knees on a firm surface. If you are on a hard floor, you may want to use   padding, such as an exercise mat, to cushion your knees. 2. Line up your arms and legs. Your hands should be directly below your shoulders, and your knees should be directly below your hips. 3. Lift your left leg behind you. At the same time, raise your right arm and straighten it in front of you. ? Do not lift your leg higher than your hip. ? Do not lift your arm higher than your shoulder. ? Keep your abdominal and back muscles tight. ? Keep your hips facing the  ground. ? Do not arch your back. ? Keep your balance carefully, and do not hold your breath. 4. Hold this position for __________ seconds. 5. Slowly return to the starting position. 6. Repeat with your right leg and your left arm. Repeat __________ times. Complete this exercise __________ times a day. Posture and body mechanics Good posture and healthy body mechanics can help to relieve stress in your body's tissues and joints. Body mechanics refers to the movements and positions of your body while you do your daily activities. Posture is part of body mechanics. Good posture means:  Your spine is in its natural S-curve position (neutral).  Your shoulders are pulled back slightly.  Your head is not tipped forward. Follow these guidelines to improve your posture and body mechanics in your everyday activities. Standing   When standing, keep your spine neutral and your feet about hip width apart. Keep a slight bend in your knees. Your ears, shoulders, and hips should line up.  When you do a task in which you stand in one place for a long time, place one foot up on a stable object that is 2-4 inches (5-10 cm) high, such as a footstool. This helps keep your spine neutral. Sitting   When sitting, keep your spine neutral and keep your feet flat on the floor. Use a footrest, if necessary, and keep your thighs parallel to the floor. Avoid rounding your shoulders, and avoid tilting your head forward.  When working at a desk or a computer, keep your desk at a height where your hands are slightly lower than your elbows. Slide your chair under your desk so you are close enough to maintain good posture.  When working at a computer, place your monitor at a height where you are looking straight ahead and you do not have to tilt your head forward or downward to look at the screen. Resting  When lying down and resting, avoid positions that are most painful for you.  If you have pain with activities  such as sitting, bending, stooping, or squatting, lie in a position in which your body does not bend very much. For example, avoid curling up on your side with your arms and knees near your chest (fetal position).  If you have pain with activities such as standing for a long time or reaching with your arms, lie with your spine in a neutral position and bend your knees slightly. Try the following positions: ? Lying on your side with a pillow between your knees. ? Lying on your back with a pillow under your knees. Lifting   When lifting objects, keep your feet at least shoulder width apart and tighten your abdominal muscles.  Bend your knees and hips and keep your spine neutral. It is important to lift using the strength of your legs, not your back. Do not lock your knees straight out.  Always ask for help to lift heavy or awkward objects. This information is not   intended to replace advice given to you by your health care provider. Make sure you discuss any questions you have with your health care provider. Document Revised: 03/08/2019 Document Reviewed: 12/06/2018 Elsevier Patient Education  2020 [employer]  

## 2020-09-09 NOTE — Progress Notes (Signed)
Established Patient Office Visit  Subjective:  Patient ID: Glenda Francis, female    DOB: 1951-05-15  Age: 69 y.o. MRN: [id number]  CC:  Chief Complaint  Patient presents with  . Pain    pain only on the left leg and  lowee back. Told by gyn she has osteoporosis which causes bone loss    HPI Glenda Francis presents for new problem of pain rating from her lumbar back down left lower extremity all way down to the foot and ankle.  Onset about 2 weeks ago.  No injury.  No numbness or weakness.  No urine or stool incontinence.  No prior history of major back difficulties.  She has achy sharp pain somewhat intermittent.  No clear exacerbating features.   Pain severity 7/10 at worst.   She had recent DEXA scan through her GYN.  She brings in a copy that to review.  She had T score -1.9.  She takes calcium and vitamin D.  She is fairly active.  She had misunderstanding that she had osteoporosis.  No recent fractures.  Past Medical History:  Diagnosis Date  . Anemia    as a child  . Asthma    childhood  . Cervicogenic headache 08/20/2015   Left side  . Chicken pox   . Depression    when son passed away  . Family history of colon cancer   . Family history of ovarian cancer   . Family history of pancreatic cancer   . Family history of stomach cancer   . Family history of uterine cancer   . Hypertension   . Seizures ([place]) 1   started in 07/2008/ last seizure was in 2012    Past Surgical History:  Procedure Laterality Date  . ABDOMINAL HYSTERECTOMY  1989   partial, menorrhagia  . CATARACT EXTRACTION Bilateral 10/26/2015, 12-04/2015   laser  . COLONOSCOPY      Family History  Problem Relation Age of Onset  . Cancer Mother        uterine,ovarian  . Uterine cancer Mother   . Ovarian cancer Mother   . Cancer Brother        colon-PASSEDA [address] 54, pancreatic  . Colon cancer Brother   . Pancreatic cancer Brother   . Cancer Brother        colon,stomach,brain  . Colon cancer Brother     . Other Sister        Pos for Lynch syndrome  . Colon cancer Other        died at 41  . Asthma Other   . Hyperlipidemia Other   . Cancer Maternal Aunt        uterine and ovarian  . Uterine cancer Maternal Aunt   . Ovarian cancer Maternal Aunt   . Cancer Maternal Aunt        ovarian and uterine  . Uterine cancer Maternal Aunt   . Ovarian cancer Maternal Aunt   . Uterine cancer Other        died at 33  . Ovarian cancer Other   . Colon cancer Cousin        6 maternal cousins with colon cancer    Social History   Socioeconomic History  . Marital status: Divorced    Spouse name: Not on file  . Number of children: 3  . Years of education: college  . Highest education level: Not on file  Occupational History  . Occupation: retired  Tobacco Use  . Smoking  status: Former Research scientist (life sciences)  . Smokeless tobacco: Never Used  . Tobacco comment: only social  Vaping Use  . Vaping Use: Never used  Substance and Sexual Activity  . Alcohol use: Yes    Alcohol/week: 7.0 standard drinks    Types: 7 Glasses of wine per week    Comment: 1 GLASS WINE WITH DINNER  . Drug use: No  . Sexual activity: Not Currently  Other Topics Concern  . Not on file  Social History Narrative   Patient is divorced   Patient  has 3 children.   Patient is retired   Patient has some college.   Patient is currently doing yoga and water aerobics.   Patient drinks 1-2 cups of caffeine daily.   Patient is right handed.    Social Determinants of Health   Financial Resource Strain:   . Difficulty of Paying Living Expenses: Not on file  Food Insecurity:   . Worried About Charity fundraiser in the Last Year: Not on file  . Ran Out of Food in the Last Year: Not on file  Transportation Needs:   . Lack of Transportation (Medical): Not on file  . Lack of Transportation (Non-Medical): Not on file  Physical Activity:   . Days of Exercise per Week: Not on file  . Minutes of Exercise per Session: Not on file  Stress:    . Feeling of Stress : Not on file  Social Connections:   . Frequency of Communication with Friends and Family: Not on file  . Frequency of Social Gatherings with Friends and Family: Not on file  . Attends Religious Services: Not on file  . Active Member of Clubs or Organizations: Not on file  . Attends Archivist Meetings: Not on file  . Marital Status: Not on file  Intimate Partner Violence:   . Fear of Current or Ex-Partner: Not on file  . Emotionally Abused: Not on file  . Physically Abused: Not on file  . Sexually Abused: Not on file    Outpatient Medications Prior to Visit  Medication Sig Dispense Refill  . Calcium Carbonate-Vitamin D (CALCIUM + D PO) Take 1 capsule by mouth daily.    . divalproex (DEPAKOTE) 500 MG DR tablet TAKE 1 TABLET (500 MG TOTAL) BY MOUTH 2 (TWO) TIMES DAILY. 180 tablet 1  . levETIRAcetam (KEPPRA) 500 MG tablet Take 2 tablets (1,000 mg total) by mouth every 12 (twelve) hours. 360 tablet 1  . losartan (COZAAR) 100 MG tablet Take 1 tablet (100 mg total) by mouth daily. 90 tablet 1  . Multiple Vitamins-Minerals (MULTIVITAMIN PO) Take 1 capsule by mouth daily.    . Multiple Vitamins-Minerals (ZINC PO) Take by mouth.     No facility-administered medications prior to visit.    No Known Allergies  ROS Review of Systems  Constitutional: Negative for chills and fever.  Respiratory: Negative for cough.   Gastrointestinal: Negative for abdominal pain.  Genitourinary: Negative for dysuria.  Musculoskeletal: Positive for back pain.  Neurological: Negative for weakness and numbness.  Hematological: Negative for adenopathy.      Objective:    Physical Exam Vitals reviewed.  Constitutional:      Appearance: Normal appearance.  Cardiovascular:     Rate and Rhythm: Normal rate and regular rhythm.  Pulmonary:     Effort: Pulmonary effort is normal.     Breath sounds: Normal breath sounds.  Musculoskeletal:     Comments: SLRs negative.  No leg  edema.  Good  distal foot pulses.  Neurological:     Mental Status: She is alert.     Comments: DTR- 1+ knee bil, 1+ ankle bil. Full strength with exception of ? Of mild weakness left dorsiflexion.  Ambulating without difficulty.      BP 140/82   Pulse 84   Temp 98.4 F (36.9 C)   Ht 4\' 11"  (1.499 m)   Wt 134 lb 12.8 oz (61.1 kg)   SpO2 97%   BMI 27.23 kg/m  Wt Readings from Last 3 Encounters:  09/09/20 134 lb 12.8 oz (61.1 kg)  01/29/20 131 lb 7 oz (59.6 kg)  09/17/19 135 lb (61.2 kg)     There are no preventive care reminders to display for this patient.  There are no preventive care reminders to display for this patient.  Lab Results  Component Value Date   TSH 0.329 (L) 07/16/2010   Lab Results  Component Value Date   WBC 12.1 (H) 03/10/2020   HGB 13.6 03/10/2020   HCT 39.8 03/10/2020   MCV 89.6 03/10/2020   PLT 290 03/10/2020   Lab Results  Component Value Date   NA 134 (L) 03/10/2020   K 3.8 03/10/2020   CO2 27 03/10/2020   GLUCOSE 255 (H) 03/10/2020   BUN 15 03/10/2020   CREATININE 0.74 03/10/2020   BILITOT 0.5 03/10/2020   ALKPHOS 48 03/10/2020   AST 22 03/10/2020   ALT 17 03/10/2020   PROT 6.8 03/10/2020   ALBUMIN 3.5 03/10/2020   CALCIUM 8.8 (L) 03/10/2020   ANIONGAP 11 03/10/2020   GFR 94.49 01/14/2019   Lab Results  Component Value Date   CHOL 217 (H) 01/29/2020   Lab Results  Component Value Date   HDL 52.90 01/29/2020   Lab Results  Component Value Date   LDLCALC 124 (H) 01/29/2020   Lab Results  Component Value Date   TRIG 200.0 (H) 01/29/2020   Lab Results  Component Value Date   CHOLHDL 4 01/29/2020   Lab Results  Component Value Date   HGBA1C (H) 07/16/2010    6.3 (NOTE)                                                                       According to the ADA Clinical Practice Recommendations for 2011, when HbA1c is used as a screening test:   >=6.5%   Diagnostic of Diabetes Mellitus           (if abnormal result  is  confirmed)  5.7-6.4%   Increased risk of developing Diabetes Mellitus  References:Diagnosis and Classification of Diabetes Mellitus,Diabetes VHQI,6962,95(MWUXL 1):S62-S69 and Standards of Medical Care in         Diabetes - 2011,Diabetes Care,2011,34  (Suppl 1):S11-S61.      Assessment & Plan:   #1 left-sided sciatica symptoms.  Nonfocal neuro exam.  No weakness.  Symptoms are relatively mild.  7 out of 10 severity at worst.  -Discussed trial of prednisone taper and take with food -Recommend 3-week follow-up and sooner as needed and particularly for any progressive pain, numbness, or weakness  #2 osteopenia.  Reviewed results of DEXA scan with her. There was no FRAX score calculated.   -Recommend regular weightbearing exercise along with calcium vitamin D  Meds ordered this encounter  Medications  . predniSONE (DELTASONE) 10 MG tablet    Sig: Taper as follows: 4-4-4-3-3-2-2-1-1    Dispense:  24 tablet    Refill:  0    Follow-up: No follow-ups on file.    Carolann Littler, MD

## 2020-09-15 ENCOUNTER — Telehealth: Payer: Self-pay | Admitting: Family Medicine

## 2020-09-15 ENCOUNTER — Ambulatory Visit (INDEPENDENT_AMBULATORY_CARE_PROVIDER_SITE_OTHER): Payer: Medicare Other

## 2020-09-15 DIAGNOSIS — Z0000 Encounter for general adult medical examination without abnormal findings: Secondary | ICD-10-CM | POA: Diagnosis not present

## 2020-09-15 NOTE — Telephone Encounter (Signed)
Attempted to reach patient x2 for medicare wellness visit. I left voicemails x2 awaiting to speak with patient so that I may reschedule her

## 2020-09-15 NOTE — Patient Instructions (Signed)
[name] , Thank you for taking time to come for your Medicare Wellness Visit. I appreciate your ongoing commitment to your health goals. Please review the following plan we discussed and let me know if I can assist you in the future.   Screening recommendations/referrals: Colonoscopy: Up to date, next due 09/25/2023 Mammogram: Up to date, next due 08/24/2021 Bone Density: Up to date, next due 08/24/2022 Recommended yearly ophthalmology/optometry visit for glaucoma screening and checkup Recommended yearly dental visit for hygiene and checkup  Vaccinations: Influenza vaccine: Patient declined education provided Pneumococcal vaccine: Patient declined education provided  Tdap vaccine: Up to date, next due 08/31/2023 Shingles vaccine: Currently due for Shingrix, you may receive at your local pharmacy     Advanced directives: Advance directive discussed with you today. Even though you declined this today please call our office should you change your mind and we can give you the proper paperwork for you to fill out.   Conditions/risks identified: None  Next appointment: 09/16/2021 @ 2:45 PM Telephone visit with Tracy 68 Years and Older, Female Preventive care refers to lifestyle choices and visits with your health care provider that can promote health and wellness. What does preventive care include?  A yearly physical exam. This is also called an annual well check.  Dental exams once or twice a year.  Routine eye exams. Ask your health care provider how often you should have your eyes checked.  Personal lifestyle choices, including:  Daily care of your teeth and gums.  Regular physical activity.  Eating a healthy diet.  Avoiding tobacco and drug use.  Limiting alcohol use.  Practicing safe sex.  Taking low-dose aspirin every day.  Taking vitamin and mineral supplements as recommended by your health care provider. What happens  during an annual well check? The services and screenings done by your health care provider during your annual well check will depend on your age, overall health, lifestyle risk factors, and family history of disease. Counseling  Your health care provider may ask you questions about your:  Alcohol use.  Tobacco use.  Drug use.  Emotional well-being.  Home and relationship well-being.  Sexual activity.  Eating habits.  History of falls.  Memory and ability to understand (cognition).  Work and work Statistician.  Reproductive health. Screening  You may have the following tests or measurements:  Height, weight, and BMI.  Blood pressure.  Lipid and cholesterol levels. These may be checked every 5 years, or more frequently if you are over 68 years old.  Skin check.  Lung cancer screening. You may have this screening every year starting at age 18 if you have a 30-pack-year history of smoking and currently smoke or have quit within the past 15 years.  Fecal occult blood test (FOBT) of the stool. You may have this test every year starting at age 39.  Flexible sigmoidoscopy or colonoscopy. You may have a sigmoidoscopy every 5 years or a colonoscopy every 10 years starting at age 59.  Hepatitis C blood test.  Hepatitis B blood test.  Sexually transmitted disease (STD) testing.  Diabetes screening. This is done by checking your blood sugar (glucose) after you have not eaten for a while (fasting). You may have this done every 1-3 years.  Bone density scan. This is done to screen for osteoporosis. You may have this done starting at age 72.  Mammogram. This may be done every 1-2 years. Talk to your health care provider  about how often you should have regular mammograms. Talk with your health care provider about your test results, treatment options, and if necessary, the need for more tests. Vaccines  Your health care provider may recommend certain vaccines, such  as:  Influenza vaccine. This is recommended every year.  Tetanus, diphtheria, and acellular pertussis (Tdap, Td) vaccine. You may need a Td booster every 10 years.  Zoster vaccine. You may need this after age 69.  Pneumococcal 13-valent conjugate (PCV13) vaccine. One dose is recommended after age 55.  Pneumococcal polysaccharide (PPSV23) vaccine. One dose is recommended after age 55. Talk to your health care provider about which screenings and vaccines you need and how often you need them. This information is not intended to replace advice given to you by your health care provider. Make sure you discuss any questions you have with your health care provider. Document Released: 12/11/2015 Document Revised: 08/03/2016 Document Reviewed: 09/15/2015 Elsevier Interactive Patient Education  2017 [place] Prevention in the Home Falls can cause injuries. They can happen to people of all ages. There are many things you can do to make your home safe and to help prevent falls. What can I do on the outside of my home?  Regularly fix the edges of walkways and driveways and fix any cracks.  Remove anything that might make you trip as you walk through a door, such as a raised step or threshold.  Trim any bushes or trees on the path to your home.  Use bright outdoor lighting.  Clear any walking paths of anything that might make someone trip, such as rocks or tools.  Regularly check to see if handrails are loose or broken. Make sure that both sides of any steps have handrails.  Any raised decks and porches should have guardrails on the edges.  Have any leaves, snow, or ice cleared regularly.  Use sand or salt on walking paths during winter.  Clean up any spills in your garage right away. This includes oil or grease spills. What can I do in the bathroom?  Use night lights.  Install grab bars by the toilet and in the tub and shower. Do not use towel bars as grab bars.  Use  non-skid mats or decals in the tub or shower.  If you need to sit down in the shower, use a plastic, non-slip stool.  Keep the floor dry. Clean up any water that spills on the floor as soon as it happens.  Remove soap buildup in the tub or shower regularly.  Attach bath mats securely with double-sided non-slip rug tape.  Do not have throw rugs and other things on the floor that can make you trip. What can I do in the bedroom?  Use night lights.  Make sure that you have a light by your bed that is easy to reach.  Do not use any sheets or blankets that are too big for your bed. They should not hang down onto the floor.  Have a firm chair that has side arms. You can use this for support while you get dressed.  Do not have throw rugs and other things on the floor that can make you trip. What can I do in the kitchen?  Clean up any spills right away.  Avoid walking on wet floors.  Keep items that you use a lot in easy-to-reach places.  If you need to reach something above you, use a strong step stool that has a grab bar.  Keep electrical cords out of the way.  Do not use floor polish or wax that makes floors slippery. If you must use wax, use non-skid floor wax.  Do not have throw rugs and other things on the floor that can make you trip. What can I do with my stairs?  Do not leave any items on the stairs.  Make sure that there are handrails on both sides of the stairs and use them. Fix handrails that are broken or loose. Make sure that handrails are as long as the stairways.  Check any carpeting to make sure that it is firmly attached to the stairs. Fix any carpet that is loose or worn.  Avoid having throw rugs at the top or bottom of the stairs. If you do have throw rugs, attach them to the floor with carpet tape.  Make sure that you have a light switch at the top of the stairs and the bottom of the stairs. If you do not have them, ask someone to add them for you. What  else can I do to help prevent falls?  Wear shoes that:  Do not have high heels.  Have rubber bottoms.  Are comfortable and fit you well.  Are closed at the toe. Do not wear sandals.  If you use a stepladder:  Make sure that it is fully opened. Do not climb a closed stepladder.  Make sure that both sides of the stepladder are locked into place.  Ask someone to hold it for you, if possible.  Clearly mark and make sure that you can see:  Any grab bars or handrails.  First and last steps.  Where the edge of each step is.  Use tools that help you move around (mobility aids) if they are needed. These include:  Canes.  Walkers.  Scooters.  Crutches.  Turn on the lights when you go into a dark area. Replace any light bulbs as soon as they burn out.  Set up your furniture so you have a clear path. Avoid moving your furniture around.  If any of your floors are uneven, fix them.  If there are any pets around you, be aware of where they are.  Review your medicines with your doctor. Some medicines can make you feel dizzy. This can increase your chance of falling. Ask your doctor what other things that you can do to help prevent falls. This information is not intended to replace advice given to you by your health care provider. Make sure you discuss any questions you have with your health care provider. Document Released: 09/10/2009 Document Revised: 04/21/2016 Document Reviewed: 12/19/2014 Elsevier Interactive Patient Education  2017 [employer].

## 2020-09-15 NOTE — Progress Notes (Signed)
Subjective:   Glenda Francis is a 69 y.o. female who presents for an Initial Medicare Annual Wellness Visit.  I connected with Glenda Francis today by telephone and verified that I am speaking with the correct person using two identifiers. Location patient: home Location provider: work Persons participating in the virtual visit: patient, provider.   I discussed the limitations, risks, security and privacy concerns of performing an evaluation and management service by telephone and the availability of in person appointments. I also discussed with the patient that there may be a patient responsible charge related to this service. The patient expressed understanding and verbally consented to this telephonic visit.    Interactive audio and video telecommunications were attempted between this provider and patient, however failed, due to patient having technical difficulties OR patient did not have access to video capability.  We continued and completed visit with audio only.     Review of Systems    N/A  Cardiac Risk Factors include: advanced age (>81men, >57 women);hypertension     Objective:    Today's Vitals   There is no height or weight on file to calculate BMI.  Advanced Directives 09/15/2020 03/10/2020 08/20/2015 09/17/2014  Does Patient Have a Medical Advance Directive? Yes No Yes No;Yes  Type of Advance Directive Living will;Healthcare Power of [place];Living will Living will  Does patient want to make changes to medical advance directive? No - Patient declined No - Patient declined - -  Copy of [place] in Chart? No - copy requested - - -  Would patient like information on creating a medical advance directive? - No - Patient declined - -    Current Medications (verified) Outpatient Encounter Medications as of 09/15/2020  Medication Sig  . Calcium Carbonate-Vitamin D (CALCIUM + D PO) Take 1 capsule by mouth daily.  .  divalproex (DEPAKOTE) 500 MG DR tablet TAKE 1 TABLET (500 MG TOTAL) BY MOUTH 2 (TWO) TIMES DAILY.  Marland Kitchen levETIRAcetam (KEPPRA) 500 MG tablet Take 2 tablets (1,000 mg total) by mouth every 12 (twelve) hours.  Marland Kitchen losartan (COZAAR) 100 MG tablet Take 1 tablet (100 mg total) by mouth daily.  . Multiple Vitamins-Minerals (MULTIVITAMIN PO) Take 1 capsule by mouth daily.  . Multiple Vitamins-Minerals (ZINC PO) Take by mouth.  . predniSONE (DELTASONE) 10 MG tablet Taper as follows: 4-4-4-3-3-2-2-1-1   No facility-administered encounter medications on file as of 09/15/2020.    Allergies (verified) Patient has no known allergies.   History: Past Medical History:  Diagnosis Date  . Anemia    as a child  . Asthma    childhood  . Cervicogenic headache 08/20/2015   Left side  . Chicken pox   . Depression    when son passed away  . Family history of colon cancer   . Family history of ovarian cancer   . Family history of pancreatic cancer   . Family history of stomach cancer   . Family history of uterine cancer   . Hypertension   . Seizures ([place]) 1   started in 07/2008/ last seizure was in 2012   Past Surgical History:  Procedure Laterality Date  . ABDOMINAL HYSTERECTOMY  1989   partial, menorrhagia  . CATARACT EXTRACTION Bilateral 10/26/2015, 12-04/2015   laser  . COLONOSCOPY     Family History  Problem Relation Age of Onset  . Cancer Mother        uterine,ovarian  . Uterine cancer Mother   .  Ovarian cancer Mother   . Cancer Brother        colon-PASSEDA WAY 54, pancreatic  . Colon cancer Brother   . Pancreatic cancer Brother   . Cancer Brother        colon,stomach,brain  . Colon cancer Brother   . Other Sister        Pos for Lynch syndrome  . Colon cancer Other        died at 48  . Asthma Other   . Hyperlipidemia Other   . Cancer Maternal Aunt        uterine and ovarian  . Uterine cancer Maternal Aunt   . Ovarian cancer Maternal Aunt   . Cancer Maternal Aunt         ovarian and uterine  . Uterine cancer Maternal Aunt   . Ovarian cancer Maternal Aunt   . Uterine cancer Other        died at 62  . Ovarian cancer Other   . Colon cancer Cousin        6 maternal cousins with colon cancer   Social History   Socioeconomic History  . Marital status: Divorced    Spouse name: Not on file  . Number of children: 3  . Years of education: college  . Highest education level: Not on file  Occupational History  . Occupation: retired  Tobacco Use  . Smoking status: Former Research scientist (life sciences)  . Smokeless tobacco: Never Used  . Tobacco comment: only social  Vaping Use  . Vaping Use: Never used  Substance and Sexual Activity  . Alcohol use: Yes    Alcohol/week: 7.0 standard drinks    Types: 7 Glasses of wine per week    Comment: 1 GLASS WINE WITH DINNER  . Drug use: No  . Sexual activity: Not Currently  Other Topics Concern  . Not on file  Social History Narrative   Patient is divorced   Patient  has 3 children.   Patient is retired   Patient has some college.   Patient is currently doing yoga and water aerobics.   Patient drinks 1-2 cups of caffeine daily.   Patient is right handed.    Social Determinants of Health   Financial Resource Strain: Low Risk   . Difficulty of Paying Living Expenses: Not hard at all  Food Insecurity: No Food Insecurity  . Worried About Charity fundraiser in the Last Year: Never true  . Ran Out of Food in the Last Year: Never true  Transportation Needs: No Transportation Needs  . Lack of Transportation (Medical): No  . Lack of Transportation (Non-Medical): No  Physical Activity: Insufficiently Active  . Days of Exercise per Week: 4 days  . Minutes of Exercise per Session: 30 min  Stress: No Stress Concern Present  . Feeling of Stress : Not at all  Social Connections: Socially Isolated  . Frequency of Communication with Friends and Family: More than three times a week  . Frequency of Social Gatherings with Friends and Family:  Once a week  . Attends Religious Services: Never  . Active Member of Clubs or Organizations: No  . Attends Archivist Meetings: Never  . Marital Status: Divorced    Tobacco Counseling Counseling given: Not Answered Comment: only social   Clinical Intake:  Pre-visit preparation completed: Yes  Pain : No/denies pain     Nutritional Risks: None Diabetes: No  How often do you need to have someone help you when you read  instructions, pamphlets, or other written materials from your doctor or pharmacy?: 1 - Never What is the last grade level you completed in school?: Some College  Diabetic?No  Interpreter Needed?: No  Information entered by :: [place] of Daily Living In your present state of health, do you have any difficulty performing the following activities: 09/15/2020  Hearing? Y  Comment Has issues with hearing to left ear  Vision? N  Difficulty concentrating or making decisions? N  Walking or climbing stairs? N  Dressing or bathing? N  Doing errands, shopping? N  Preparing Food and eating ? N  Using the Toilet? N  In the past six months, have you accidently leaked urine? N  Do you have problems with loss of bowel control? N  Managing your Medications? N  Managing your Finances? N  Housekeeping or managing your Housekeeping? N  Some recent data might be hidden    Patient Care Team: Eulas Post, MD as PCP - General (Family Medicine)  Indicate any recent Medical Services you may have received from other than [facility] providers in the past year (date may be approximate).     Assessment:   This is a routine wellness examination for [place].  Hearing/Vision screen  Hearing Screening   125Hz  250Hz  500Hz  1000Hz  2000Hz  3000Hz  4000Hz  6000Hz  8000Hz   Right ear:           Left ear:           Vision Screening Comments: Patient states gets eyes checked every 6 months   Dietary issues and exercise activities discussed: Current Exercise  Habits: Home exercise routine, Type of exercise: walking;stretching;yoga, Time (Minutes): 30, Frequency (Times/Week): 3, Weekly Exercise (Minutes/Week): 90, Intensity: Mild  Goals    . Exercise 150 min/wk Moderate Activity    . Patient Stated     I would like to try to travel to [place] and to my country to see my family       Depression Screen PHQ 2/9 Scores 09/15/2020 01/30/2020  PHQ - 2 Score 0 4  PHQ- 9 Score 1 14    Fall Risk Fall Risk  09/15/2020 09/09/2020  Falls in the past year? 0 0  Number falls in past yr: 0 0  Injury with Fall? 0 0  Risk for fall due to : No Fall Risks -  Follow up Falls evaluation completed;Falls prevention discussed -    Any stairs in or around the home? Yes  If so, are there any without handrails? No  Home free of loose throw rugs in walkways, pet beds, electrical cords, etc? Yes  Adequate lighting in your home to reduce risk of falls? Yes   ASSISTIVE DEVICES UTILIZED TO PREVENT FALLS:  Life alert? No  Use of a cane, walker or w/c? No  Grab bars in the bathroom? Yes  Shower chair or bench in shower? Yes  Elevated toilet seat or a handicapped toilet? No   .     Cognitive Function:  Cognition within normal limits, cognitive screening not indicated      Immunizations Immunization History  Administered Date(s) Administered  . Influenza,inj,Quad PF,6+ Mos 08/30/2013  . PFIZER SARS-COV-2 Vaccination 03/07/2020, 04/01/2020  . Tdap 08/30/2013    TDAP status: Up to date Flu Vaccine status: Declined, Education has been provided regarding the importance of this vaccine but patient still declined. Advised may receive this vaccine at local pharmacy or Health Dept. Aware to provide a copy of the vaccination record if obtained from local  pharmacy or Health Dept. Verbalized acceptance and understanding. Pneumococcal vaccine status: Declined,  Education has been provided regarding the importance of this vaccine but patient still declined. Advised may  receive this vaccine at local pharmacy or Health Dept. Aware to provide a copy of the vaccination record if obtained from local pharmacy or Health Dept. Verbalized acceptance and understanding.  Covid-19 vaccine status: Completed vaccines  Qualifies for Shingles Vaccine? Yes   Zostavax completed No   Shingrix Completed?: No.    Education has been provided regarding the importance of this vaccine. Patient has been advised to call insurance company to determine out of pocket expense if they have not yet received this vaccine. Advised may also receive vaccine at local pharmacy or Health Dept. Verbalized acceptance and understanding.  Screening Tests Health Maintenance  Topic Date Due  . INFLUENZA VACCINE  02/25/2021 (Originally 06/28/2020)  . Hepatitis C Screening  09/09/2021 (Originally Sep 28, 1951)  . PNA vac Low Risk Adult (1 of 2 - PCV13) 09/09/2021 (Originally 11/05/2017)  . MAMMOGRAM  08/24/2022  . TETANUS/TDAP  08/31/2023  . COLONOSCOPY  09/25/2023  . DEXA SCAN  Completed  . COVID-19 Vaccine  Completed    Health Maintenance  There are no preventive care reminders to display for this patient.  Colorectal cancer screening: Completed 09/24/2018. Repeat every 5 years Mammogram status: Completed 08/24/2020. Repeat every year Bone Density status: Ordered 09/15/2020. Pt provided with contact info and advised to call to schedule appt.  Lung Cancer Screening: (Low Dose CT Chest recommended if Age 58-80 years, 30 pack-year currently smoking OR have quit w/in 15years.) does not qualify.   Lung Cancer Screening Referral: N/A  Additional Screening:  Hepatitis C Screening: does qualify;   Vision Screening: Recommended annual ophthalmology exams for early detection of glaucoma and other disorders of the eye. Is the patient up to date with their annual eye exam?  Yes  Who is the provider or what is the name of the office in which the patient attends annual eye exams? Dr. Truman Hayward, If pt is not  established with a provider, would they like to be referred to a provider to establish care? No .   Dental Screening: Recommended annual dental exams for proper oral hygiene  Community Resource Referral / Chronic Care Management: CRR required this visit?  No   CCM required this visit?  No      Plan:     I have personally reviewed and noted the following in the patient's chart:   . Medical and social history . Use of alcohol, tobacco or illicit drugs  . Current medications and supplements . Functional ability and status . Nutritional status . Physical activity . Advanced directives . List of other physicians . Hospitalizations, surgeries, and ER visits in previous 12 months . Vitals . Screenings to include cognitive, depression, and falls . Referrals and appointments  In addition, I have reviewed and discussed with patient certain preventive protocols, quality metrics, and best practice recommendations. A written personalized care plan for preventive services as well as general preventive health recommendations were provided to patient.     Glenda Neas, LPN   [id number]   Nurse Notes: None

## 2020-09-15 NOTE — Progress Notes (Signed)
   Subjective:  Erroneous Encounter

## 2020-10-06 ENCOUNTER — Other Ambulatory Visit: Payer: Self-pay | Admitting: Family Medicine

## 2020-11-11 ENCOUNTER — Other Ambulatory Visit: Payer: Self-pay | Admitting: Neurology

## 2021-01-11 ENCOUNTER — Encounter: Payer: Self-pay | Admitting: Family Medicine

## 2021-01-11 ENCOUNTER — Other Ambulatory Visit: Payer: Self-pay

## 2021-01-11 ENCOUNTER — Ambulatory Visit (INDEPENDENT_AMBULATORY_CARE_PROVIDER_SITE_OTHER): Payer: Medicare Other | Admitting: Family Medicine

## 2021-01-11 VITALS — BP 148/88 | HR 77 | Ht 59.0 in | Wt 137.0 lb

## 2021-01-11 DIAGNOSIS — I10 Essential (primary) hypertension: Secondary | ICD-10-CM | POA: Diagnosis not present

## 2021-01-11 DIAGNOSIS — Z833 Family history of diabetes mellitus: Secondary | ICD-10-CM

## 2021-01-11 DIAGNOSIS — M71321 Other bursal cyst, right elbow: Secondary | ICD-10-CM

## 2021-01-11 MED ORDER — LISINOPRIL-HYDROCHLOROTHIAZIDE 20-12.5 MG PO TABS: 1.0000 | ORAL_TABLET | Freq: Every day | ORAL | 3 refills | Status: DC

## 2021-01-11 NOTE — Progress Notes (Signed)
Established Patient Office Visit  Subjective:  Patient ID: Glenda Francis, female    DOB: 25-Dec-1950  Age: 70 y.o. MRN: [id number]  CC:  Chief Complaint  Patient presents with  . Hypertension  . Elbow Problem    HPI Glenda Francis presents for several week history of out-of-control blood pressure.  She had been on losartan 100 mg daily.  She is down in [place] recently visiting family and took some blood pressure readings as high as 409 systolic.  Her sister had lisinopril 20 mg daily and she started taking that in place of losartan on her own.  For several days her blood pressure did seem improved but over the past few days she has had multiple readings 811-914 range systolic.  No headaches.  No dizziness.  No cough.  No alcohol use.  No nonsteroidal use.  She has tried to watch her sodium intake closely.  No consistent exercise.  She also has nonpainful cystic lesion right elbow which she noted recently.  This is aggravating because of applying pressure when she puts her elbow on the surface that she feels this.  She denies any injury.  No associated redness or warmth.  No history of gout.  Past Medical History:  Diagnosis Date  . Anemia    as a child  . Asthma    childhood  . Cervicogenic headache 08/20/2015   Left side  . Chicken pox   . Depression    when son passed away  . Family history of colon cancer   . Family history of ovarian cancer   . Family history of pancreatic cancer   . Family history of stomach cancer   . Family history of uterine cancer   . Hypertension   . Seizures ([place]) 1   started in 07/2008/ last seizure was in 2012    Past Surgical History:  Procedure Laterality Date  . ABDOMINAL HYSTERECTOMY  1989   partial, menorrhagia  . CATARACT EXTRACTION Bilateral 10/26/2015, 12-04/2015   laser  . COLONOSCOPY      Family History  Problem Relation Age of Onset  . Cancer Mother        uterine,ovarian  . Uterine cancer Mother   . Ovarian cancer Mother    . Cancer Brother        colon-PASSEDA WAY 54, pancreatic  . Colon cancer Brother   . Pancreatic cancer Brother   . Cancer Brother        colon,stomach,brain  . Colon cancer Brother   . Other Sister        Pos for Lynch syndrome  . Colon cancer Other        died at 37  . Asthma Other   . Hyperlipidemia Other   . Cancer Maternal Aunt        uterine and ovarian  . Uterine cancer Maternal Aunt   . Ovarian cancer Maternal Aunt   . Cancer Maternal Aunt        ovarian and uterine  . Uterine cancer Maternal Aunt   . Ovarian cancer Maternal Aunt   . Uterine cancer Other        died at 73  . Ovarian cancer Other   . Colon cancer Cousin        6 maternal cousins with colon cancer    Social History   Socioeconomic History  . Marital status: Divorced    Spouse name: Not on file  . Number of children: 3  . Years of education:  college  . Highest education level: Not on file  Occupational History  . Occupation: retired  Tobacco Use  . Smoking status: Former Research scientist (life sciences)  . Smokeless tobacco: Never Used  . Tobacco comment: only social  Vaping Use  . Vaping Use: Never used  Substance and Sexual Activity  . Alcohol use: Yes    Alcohol/week: 7.0 standard drinks    Types: 7 Glasses of wine per week    Comment: 1 GLASS WINE WITH DINNER  . Drug use: No  . Sexual activity: Not Currently  Other Topics Concern  . Not on file  Social History Narrative   Patient is divorced   Patient  has 3 children.   Patient is retired   Patient has some college.   Patient is currently doing yoga and water aerobics.   Patient drinks 1-2 cups of caffeine daily.   Patient is right handed.    Social Determinants of Health   Financial Resource Strain: Low Risk   . Difficulty of Paying Living Expenses: Not hard at all  Food Insecurity: No Food Insecurity  . Worried About Charity fundraiser in the Last Year: Never true  . Ran Out of Food in the Last Year: Never true  Transportation Needs: No  Transportation Needs  . Lack of Transportation (Medical): No  . Lack of Transportation (Non-Medical): No  Physical Activity: Insufficiently Active  . Days of Exercise per Week: 4 days  . Minutes of Exercise per Session: 30 min  Stress: No Stress Concern Present  . Feeling of Stress : Not at all  Social Connections: Socially Isolated  . Frequency of Communication with Friends and Family: More than three times a week  . Frequency of Social Gatherings with Friends and Family: Once a week  . Attends Religious Services: Never  . Active Member of Clubs or Organizations: No  . Attends Archivist Meetings: Never  . Marital Status: Divorced  Human resources officer Violence: Not At Risk  . Fear of Current or Ex-Partner: No  . Emotionally Abused: No  . Physically Abused: No  . Sexually Abused: No    Outpatient Medications Prior to Visit  Medication Sig Dispense Refill  . lisinopril (ZESTRIL) 20 MG tablet Take 20 mg by mouth daily.    . divalproex (DEPAKOTE) 500 MG DR tablet Take 1 tablet by mouth twice daily 180 tablet 0  . levETIRAcetam (KEPPRA) 500 MG tablet TAKE 2 TABLETS BY MOUTH EVERY 12 HOURS 360 tablet 0  . Calcium Carbonate-Vitamin D (CALCIUM + D PO) Take 1 capsule by mouth daily.    Marland Kitchen losartan (COZAAR) 100 MG tablet Take 1 tablet by mouth once daily 90 tablet 0  . Multiple Vitamins-Minerals (MULTIVITAMIN PO) Take 1 capsule by mouth daily.    . Multiple Vitamins-Minerals (ZINC PO) Take by mouth.    . predniSONE (DELTASONE) 10 MG tablet Taper as follows: 4-4-4-3-3-2-2-1-1 24 tablet 0   No facility-administered medications prior to visit.    No Known Allergies  ROS Review of Systems  Constitutional: Negative for fatigue and unexpected weight change.  Eyes: Negative for visual disturbance.  Respiratory: Negative for cough, chest tightness, shortness of breath and wheezing.   Cardiovascular: Negative for chest pain, palpitations and leg swelling.  Neurological: Negative for  dizziness, seizures, syncope, weakness, light-headedness and headaches.      Objective:    Physical Exam Vitals reviewed.  Constitutional:      Appearance: Normal appearance.  Cardiovascular:     Rate and Rhythm:  Normal rate and regular rhythm.  Pulmonary:     Effort: Pulmonary effort is normal.     Breath sounds: Normal breath sounds.  Musculoskeletal:     Right lower leg: No edema.     Left lower leg: No edema.     Comments: Right elbow reveals full range of motion.  She has approximately 1-1/2 cm very mobile rounded nonpainful cystic lesion right elbow.  This is in the olecranon bursa region.  No overlying erythema or warmth.  Neurological:     Mental Status: She is alert.     BP (!) 148/88   Pulse 77   Ht 4\' 11"  (1.499 m)   Wt 137 lb (62.1 kg)   SpO2 98%   BMI 27.67 kg/m  Wt Readings from Last 3 Encounters:  01/11/21 137 lb (62.1 kg)  09/09/20 134 lb 12.8 oz (61.1 kg)  01/29/20 131 lb 7 oz (59.6 kg)     Health Maintenance Due  Topic Date Due  . COVID-19 Vaccine (3 - Booster for Pfizer series) 10/02/2020    There are no preventive care reminders to display for this patient.  Lab Results  Component Value Date   TSH 0.329 (L) 07/16/2010   Lab Results  Component Value Date   WBC 12.1 (H) 03/10/2020   HGB 13.6 03/10/2020   HCT 39.8 03/10/2020   MCV 89.6 03/10/2020   PLT 290 03/10/2020   Lab Results  Component Value Date   NA 134 (L) 03/10/2020   K 3.8 03/10/2020   CO2 27 03/10/2020   GLUCOSE 255 (H) 03/10/2020   BUN 15 03/10/2020   CREATININE 0.74 03/10/2020   BILITOT 0.5 03/10/2020   ALKPHOS 48 03/10/2020   AST 22 03/10/2020   ALT 17 03/10/2020   PROT 6.8 03/10/2020   ALBUMIN 3.5 03/10/2020   CALCIUM 8.8 (L) 03/10/2020   ANIONGAP 11 03/10/2020   GFR 94.49 01/14/2019   Lab Results  Component Value Date   CHOL 217 (H) 01/29/2020   Lab Results  Component Value Date   HDL 52.90 01/29/2020   Lab Results  Component Value Date   LDLCALC  124 (H) 01/29/2020   Lab Results  Component Value Date   TRIG 200.0 (H) 01/29/2020   Lab Results  Component Value Date   CHOLHDL 4 01/29/2020   Lab Results  Component Value Date   HGBA1C (H) 07/16/2010    6.3 (NOTE)                                                                       According to the ADA Clinical Practice Recommendations for 2011, when HbA1c is used as a screening test:   >=6.5%   Diagnostic of Diabetes Mellitus           (if abnormal result  is confirmed)  5.7-6.4%   Increased risk of developing Diabetes Mellitus  References:Diagnosis and Classification of Diabetes Mellitus,Diabetes TFTD,3220,25([facility]HCW 1):S62-S69 and Standards of Medical Care in         Diabetes - 2011,Diabetes Care,2011,34  (Suppl 1):S11-S61.      Assessment & Plan:   #1 hypertension poorly controlled by several home readings as well as reading today. -Discontinue lisinopril 20 mg daily -Start lisinopril HCTZ 20/12.5 mg  one daily -Continue to monitor blood pressure closely and bring back in about 3 to 4 weeks and recheck basic metabolic panel at follow-up  #2 benign-appearing cystic lesion right elbow. -Reassurance and observe for now  #3 past history of elevated nonfasting glucose. -We recommend follow-up fasting labs with basic metabolic panel at follow-up along with A1c.  Meds ordered this encounter  Medications  . lisinopril-hydrochlorothiazide (ZESTORETIC) 20-12.5 MG tablet    Sig: Take 1 tablet by mouth daily.    Dispense:  90 tablet    Refill:  3    Follow-up: Return in about 1 month (around 02/08/2021).    Carolann Littler, MD

## 2021-01-11 NOTE — Patient Instructions (Signed)

## 2021-01-12 ENCOUNTER — Other Ambulatory Visit (INDEPENDENT_AMBULATORY_CARE_PROVIDER_SITE_OTHER): Payer: Medicare Other

## 2021-01-12 DIAGNOSIS — Z833 Family history of diabetes mellitus: Secondary | ICD-10-CM

## 2021-01-12 DIAGNOSIS — I10 Essential (primary) hypertension: Secondary | ICD-10-CM | POA: Diagnosis not present

## 2021-01-12 LAB — BASIC METABOLIC PANEL
BUN: 12 mg/dL (ref 6–23)
CO2: 33 mEq/L — ABNORMAL HIGH (ref 19–32)
Calcium: 9.6 mg/dL (ref 8.4–10.5)
Chloride: 98 mEq/L (ref 96–112)
Creatinine, Ser: 0.66 mg/dL (ref 0.40–1.20)
GFR: 90.28 mL/min (ref >60.00)
Glucose, Bld: 83 mg/dL (ref 70–99)
Potassium: 4.1 mEq/L (ref 3.5–5.1)
Sodium: 138 mEq/L (ref 135–145)

## 2021-01-12 LAB — HEMOGLOBIN A1C: Hgb A1c MFr Bld: 6.2 % (ref 4.6–6.5)

## 2021-01-13 NOTE — Progress Notes (Signed)
Mychart message sent: Electrolytes are stable.  Her A1c remains stable at 6.2%.  No concerns with the labs.

## 2021-01-14 ENCOUNTER — Other Ambulatory Visit: Payer: Self-pay

## 2021-01-14 MED ORDER — LISINOPRIL-HYDROCHLOROTHIAZIDE 20-12.5 MG PO TABS: 1.0000 | ORAL_TABLET | Freq: Every day | ORAL | 3 refills | Status: DC

## 2021-01-14 NOTE — Progress Notes (Signed)
Patient called in stating that [facility] did not receive her Lisinopril HCTZ that was sent in on Monday 01/11/2021 by Dr. Elease Hashimoto. Medication resent to the pharmacy.

## 2021-02-08 ENCOUNTER — Other Ambulatory Visit: Payer: Self-pay

## 2021-02-08 ENCOUNTER — Ambulatory Visit (INDEPENDENT_AMBULATORY_CARE_PROVIDER_SITE_OTHER): Payer: Medicare Other | Admitting: Family Medicine

## 2021-02-08 ENCOUNTER — Encounter: Payer: Self-pay | Admitting: Family Medicine

## 2021-02-08 VITALS — BP 132/90 | HR 74 | Temp 97.9°F | Ht 59.0 in | Wt 137.1 lb

## 2021-02-08 DIAGNOSIS — I10 Essential (primary) hypertension: Secondary | ICD-10-CM | POA: Diagnosis not present

## 2021-02-08 DIAGNOSIS — H9193 Unspecified hearing loss, bilateral: Secondary | ICD-10-CM | POA: Diagnosis not present

## 2021-02-08 NOTE — Patient Instructions (Signed)
Lose some weight  Get back to more regular exercise.  Keep sodium intake < 2,400 mg per day  Make sure to set up Audiologist evaluation for hearing loss  Set up 3 month follow up.

## 2021-02-08 NOTE — Progress Notes (Signed)
Established Patient Office Visit  Subjective:  Patient ID: Glenda Francis, female    DOB: 10-20-1951  Age: 70 y.o. MRN: [id number]  CC:  Chief Complaint  Patient presents with  . Follow-up  . Hearing Problem    Patient complains of hearing loss for some time, worse lately and feels the left ear is worse lately    HPI Glenda Francis presents for follow-up hypertension.  We switched her to lisinopril HCTZ last visit.  She did see some initial improvement in her systolic readings and diastolics have been fairly consistently in the 70s but she has had some recent systolics now back up in the 150 range.  No headaches.  No chest pains.  No peripheral edema.  Compliant with therapy.  Does use excessive sodium she thinks some days.  No regular alcohol use.  Not currently exercising regularly.  She also relates some hearing loss left ear greater than right.  She is not had hearing tested.  Occasional tinnitus but not consistently.  Denies any sudden hearing loss and this is been gradual over time.  No consistent vertigo symptoms.  Past Medical History:  Diagnosis Date  . Anemia    as a child  . Asthma    childhood  . Cervicogenic headache 08/20/2015   Left side  . Chicken pox   . Depression    when son passed away  . Family history of colon cancer   . Family history of ovarian cancer   . Family history of pancreatic cancer   . Family history of stomach cancer   . Family history of uterine cancer   . Hypertension   . Seizures ([place]) 1   started in 07/2008/ last seizure was in 2012    Past Surgical History:  Procedure Laterality Date  . ABDOMINAL HYSTERECTOMY  1989   partial, menorrhagia  . CATARACT EXTRACTION Bilateral 10/26/2015, 12-04/2015   laser  . COLONOSCOPY      Family History  Problem Relation Age of Onset  . Cancer Mother        uterine,ovarian  . Uterine cancer Mother   . Ovarian cancer Mother   . Cancer Brother        colon-PASSEDA WAY 54, pancreatic  . Colon  cancer Brother   . Pancreatic cancer Brother   . Cancer Brother        colon,stomach,brain  . Colon cancer Brother   . Other Sister        Pos for Lynch syndrome  . Colon cancer Other        died at 52  . Asthma Other   . Hyperlipidemia Other   . Cancer Maternal Aunt        uterine and ovarian  . Uterine cancer Maternal Aunt   . Ovarian cancer Maternal Aunt   . Cancer Maternal Aunt        ovarian and uterine  . Uterine cancer Maternal Aunt   . Ovarian cancer Maternal Aunt   . Uterine cancer Other        died at 52  . Ovarian cancer Other   . Colon cancer Cousin        6 maternal cousins with colon cancer    Social History   Socioeconomic History  . Marital status: Divorced    Spouse name: Not on file  . Number of children: 3  . Years of education: college  . Highest education level: Not on file  Occupational History  . Occupation: retired  Tobacco Use  . Smoking status: Former Research scientist (life sciences)  . Smokeless tobacco: Never Used  . Tobacco comment: only social  Vaping Use  . Vaping Use: Never used  Substance and Sexual Activity  . Alcohol use: Yes    Alcohol/week: 7.0 standard drinks    Types: 7 Glasses of wine per week    Comment: 1 GLASS WINE WITH DINNER  . Drug use: No  . Sexual activity: Not Currently  Other Topics Concern  . Not on file  Social History Narrative   Patient is divorced   Patient  has 3 children.   Patient is retired   Patient has some college.   Patient is currently doing yoga and water aerobics.   Patient drinks 1-2 cups of caffeine daily.   Patient is right handed.    Social Determinants of Health   Financial Resource Strain: Low Risk   . Difficulty of Paying Living Expenses: Not hard at all  Food Insecurity: No Food Insecurity  . Worried About Charity fundraiser in the Last Year: Never true  . Ran Out of Food in the Last Year: Never true  Transportation Needs: No Transportation Needs  . Lack of Transportation (Medical): No  . Lack of  Transportation (Non-Medical): No  Physical Activity: Insufficiently Active  . Days of Exercise per Week: 4 days  . Minutes of Exercise per Session: 30 min  Stress: No Stress Concern Present  . Feeling of Stress : Not at all  Social Connections: Socially Isolated  . Frequency of Communication with Friends and Family: More than three times a week  . Frequency of Social Gatherings with Friends and Family: Once a week  . Attends Religious Services: Never  . Active Member of Clubs or Organizations: No  . Attends Archivist Meetings: Never  . Marital Status: Divorced  Human resources officer Violence: Not At Risk  . Fear of Current or Ex-Partner: No  . Emotionally Abused: No  . Physically Abused: No  . Sexually Abused: No    Outpatient Medications Prior to Visit  Medication Sig Dispense Refill  . divalproex (DEPAKOTE) 500 MG DR tablet Take 1 tablet by mouth twice daily 180 tablet 0  . levETIRAcetam (KEPPRA) 500 MG tablet TAKE 2 TABLETS BY MOUTH EVERY 12 HOURS 360 tablet 0  . lisinopril-hydrochlorothiazide (ZESTORETIC) 20-12.5 MG tablet Take 1 tablet by mouth daily. 90 tablet 3   No facility-administered medications prior to visit.    No Known Allergies  ROS Review of Systems  Constitutional: Negative for chills, fatigue and fever.  HENT: Positive for hearing loss.   Eyes: Negative for visual disturbance.  Respiratory: Negative for cough, chest tightness, shortness of breath and wheezing.   Cardiovascular: Negative for chest pain, palpitations and leg swelling.  Neurological: Negative for dizziness, seizures, syncope, weakness, light-headedness and headaches.      Objective:    Physical Exam Vitals reviewed.  Constitutional:      Appearance: Normal appearance.  HENT:     Ears:     Comments: Minimal nonobstructing cerumen in both canals Cardiovascular:     Rate and Rhythm: Normal rate and regular rhythm.     Pulses: Normal pulses.  Pulmonary:     Effort: Pulmonary  effort is normal.     Breath sounds: Normal breath sounds.  Musculoskeletal:     Cervical back: Neck supple.     Right lower leg: No edema.     Left lower leg: No edema.  Neurological:  Mental Status: She is alert.     BP 132/90 (BP Location: Left Arm, Patient Position: Sitting, Cuff Size: Normal)   Pulse 74   Temp 97.9 F (36.6 C) (Oral)   Ht 4\' 11"  (1.499 m)   Wt 137 lb 1.6 oz (62.2 kg)   SpO2 96%   BMI 27.69 kg/m  Wt Readings from Last 3 Encounters:  02/08/21 137 lb 1.6 oz (62.2 kg)  01/11/21 137 lb (62.1 kg)  09/09/20 134 lb 12.8 oz (61.1 kg)     There are no preventive care reminders to display for this patient.  There are no preventive care reminders to display for this patient.  Lab Results  Component Value Date   TSH 0.329 (L) 07/16/2010   Lab Results  Component Value Date   WBC 12.1 (H) 03/10/2020   HGB 13.6 03/10/2020   HCT 39.8 03/10/2020   MCV 89.6 03/10/2020   PLT 290 03/10/2020   Lab Results  Component Value Date   NA 138 01/12/2021   K 4.1 01/12/2021   CO2 33 (H) 01/12/2021   GLUCOSE 83 01/12/2021   BUN 12 01/12/2021   CREATININE 0.66 01/12/2021   BILITOT 0.5 03/10/2020   ALKPHOS 48 03/10/2020   AST 22 03/10/2020   ALT 17 03/10/2020   PROT 6.8 03/10/2020   ALBUMIN 3.5 03/10/2020   CALCIUM 9.6 01/12/2021   ANIONGAP 11 03/10/2020   GFR 90.28 01/12/2021   Lab Results  Component Value Date   CHOL 217 (H) 01/29/2020   Lab Results  Component Value Date   HDL 52.90 01/29/2020   Lab Results  Component Value Date   LDLCALC 124 (H) 01/29/2020   Lab Results  Component Value Date   TRIG 200.0 (H) 01/29/2020   Lab Results  Component Value Date   CHOLHDL 4 01/29/2020   Lab Results  Component Value Date   HGBA1C 6.2 01/12/2021      Assessment & Plan:   #1 hypertension.  Still suboptimally controlled.  Repeat blood pressure left arm seated by me 150/78. -We discussed possible addition of amlodipine to her current regimen.   She prefers 3 months of lifestyle modification.  We strongly advise weight loss, more consistent aerobic exercise and keep sodium intake less than 2400 mg daily. -Reassess in 3 months and if not to goal that point we will add additional medications such as amlodipine  #2 gradual hearing loss left greater than right.  She does not describe sudden sensorineural type hearing loss.  This sounds more gradual.  We recommend that she set up audiology assessment as first step.  She does not have any red flags such as unilateral tinnitus, pulsatile tinnitus, or persistent vertigo  No orders of the defined types were placed in this encounter.   Follow-up: Return in about 3 months (around 05/11/2021).    Carolann Littler, MD

## 2021-02-11 ENCOUNTER — Other Ambulatory Visit: Payer: Self-pay | Admitting: Neurology

## 2021-02-11 ENCOUNTER — Encounter: Payer: Self-pay | Admitting: *Deleted

## 2021-02-15 ENCOUNTER — Telehealth: Payer: Self-pay | Admitting: Neurology

## 2021-02-15 MED ORDER — DIVALPROEX SODIUM 500 MG PO DR TAB: DELAYED_RELEASE_TABLET | ORAL | 0 refills | Status: DC

## 2021-02-15 MED ORDER — LEVETIRACETAM 500 MG PO TABS: 1000.0000 mg | ORAL_TABLET | Freq: Two times a day (BID) | ORAL | 0 refills | Status: DC

## 2021-02-15 NOTE — Addendum Note (Signed)
Addended by: Noberto Retort C on: 02/15/2021 11:45 AM   Modules accepted: Orders

## 2021-02-15 NOTE — Telephone Encounter (Signed)
The patient has been scheduled for a follow up on 05/10/21. Refills sent to pharmacy to avoid disruption to treatment. She will need to keep this appt to for further refills.

## 2021-02-15 NOTE — Telephone Encounter (Signed)
Pt. is requesting refills for divalproex (DEPAKOTE) 500 MG DR tablet & levETIRAcetam (KEPPRA) 500 MG tablet.  Pharmacy: [facility] [phone]

## 2021-04-20 ENCOUNTER — Encounter: Payer: Self-pay | Admitting: Family Medicine

## 2021-04-20 ENCOUNTER — Ambulatory Visit (INDEPENDENT_AMBULATORY_CARE_PROVIDER_SITE_OTHER): Payer: Medicare Other | Admitting: Family Medicine

## 2021-04-20 ENCOUNTER — Other Ambulatory Visit: Payer: Self-pay

## 2021-04-20 VITALS — BP 150/80 | HR 64 | Temp 97.6°F | Wt 136.7 lb

## 2021-04-20 DIAGNOSIS — I10 Essential (primary) hypertension: Secondary | ICD-10-CM

## 2021-04-20 DIAGNOSIS — R2231 Localized swelling, mass and lump, right upper limb: Secondary | ICD-10-CM | POA: Diagnosis not present

## 2021-04-20 MED ORDER — LISINOPRIL-HYDROCHLOROTHIAZIDE 20-12.5 MG PO TABS: 1.0000 | ORAL_TABLET | Freq: Every day | ORAL | 3 refills | Status: DC

## 2021-04-20 NOTE — Progress Notes (Signed)
Established Patient Office Visit  Subjective:  Patient ID: Glenda Francis, female    DOB: Nov 18, 1951  Age: 70 y.o. MRN: [id number]  CC:  Chief Complaint  Patient presents with  . Follow-up    Hypertension, pt has a BP log from home    HPI [name] presents for follow-up hypertension and to evaluate soft tissue mass right elbow.  She first noticed the mass about 6 months ago.  This is slightly painful.  Mobile.  Denies any injury.  Denies any paresthesias or weakness of the upper extremity.  Not growing rapidly in size.  She would like to have this evaluated by orthopedist.  She has hypertension and had recent elevated readings.  We suggested 3 months of lifestyle modification and follow-up.  She does bring log of readings and most of her home readings are less than 093 systolic and consistently less than 90 diastolic.  She is currently on lisinopril HCTZ 20/12.5 mg 1 daily.  Compliant with therapy.  Not exercising regularly.  No recent headaches or dizziness.  No alcohol use.  No peripheral edema issues.  Past Medical History:  Diagnosis Date  . Anemia    as a child  . Asthma    childhood  . Cervicogenic headache 08/20/2015   Left side  . Chicken pox   . Depression    when son passed away  . Family history of colon cancer   . Family history of ovarian cancer   . Family history of pancreatic cancer   . Family history of stomach cancer   . Family history of uterine cancer   . Hypertension   . Seizures ([place]) 1   started in 07/2008/ last seizure was in 2012    Past Surgical History:  Procedure Laterality Date  . ABDOMINAL HYSTERECTOMY  1989   partial, menorrhagia  . CATARACT EXTRACTION Bilateral 10/26/2015, 12-04/2015   laser  . COLONOSCOPY      Family History  Problem Relation Age of Onset  . Cancer Mother        uterine,ovarian  . Uterine cancer Mother   . Ovarian cancer Mother   . Cancer Brother        colon-PASSEDA WAY 54, pancreatic  . Colon cancer Brother    . Pancreatic cancer Brother   . Cancer Brother        colon,stomach,brain  . Colon cancer Brother   . Other Sister        Pos for Lynch syndrome  . Colon cancer Other        died at 66  . Asthma Other   . Hyperlipidemia Other   . Cancer Maternal Aunt        uterine and ovarian  . Uterine cancer Maternal Aunt   . Ovarian cancer Maternal Aunt   . Cancer Maternal Aunt        ovarian and uterine  . Uterine cancer Maternal Aunt   . Ovarian cancer Maternal Aunt   . Uterine cancer Other        died at 75  . Ovarian cancer Other   . Colon cancer Cousin        6 maternal cousins with colon cancer    Social History   Socioeconomic History  . Marital status: Divorced    Spouse name: Not on file  . Number of children: 3  . Years of education: college  . Highest education level: Not on file  Occupational History  . Occupation: retired  Tobacco Use  .  Smoking status: Former Research scientist (life sciences)  . Smokeless tobacco: Never Used  . Tobacco comment: only social  Vaping Use  . Vaping Use: Never used  Substance and Sexual Activity  . Alcohol use: Yes    Alcohol/week: 7.0 standard drinks    Types: 7 Glasses of wine per week    Comment: 1 GLASS WINE WITH DINNER  . Drug use: No  . Sexual activity: Not Currently  Other Topics Concern  . Not on file  Social History Narrative   Patient is divorced   Patient  has 3 children.   Patient is retired   Patient has some college.   Patient is currently doing yoga and water aerobics.   Patient drinks 1-2 cups of caffeine daily.   Patient is right handed.    Social Determinants of Health   Financial Resource Strain: Low Risk   . Difficulty of Paying Living Expenses: Not hard at all  Food Insecurity: No Food Insecurity  . Worried About Charity fundraiser in the Last Year: Never true  . Ran Out of Food in the Last Year: Never true  Transportation Needs: No Transportation Needs  . Lack of Transportation (Medical): No  . Lack of Transportation  (Non-Medical): No  Physical Activity: Insufficiently Active  . Days of Exercise per Week: 4 days  . Minutes of Exercise per Session: 30 min  Stress: No Stress Concern Present  . Feeling of Stress : Not at all  Social Connections: Socially Isolated  . Frequency of Communication with Friends and Family: More than three times a week  . Frequency of Social Gatherings with Friends and Family: Once a week  . Attends Religious Services: Never  . Active Member of Clubs or Organizations: No  . Attends Archivist Meetings: Never  . Marital Status: Divorced  Human resources officer Violence: Not At Risk  . Fear of Current or Ex-Partner: No  . Emotionally Abused: No  . Physically Abused: No  . Sexually Abused: No    Outpatient Medications Prior to Visit  Medication Sig Dispense Refill  . divalproex (DEPAKOTE) 500 MG DR tablet Take 1 tablet by mouth twice daily 180 tablet 0  . levETIRAcetam (KEPPRA) 500 MG tablet Take 2 tablets (1,000 mg total) by mouth every 12 (twelve) hours. 360 tablet 0  . lisinopril-hydrochlorothiazide (ZESTORETIC) 20-12.5 MG tablet Take 1 tablet by mouth daily. 90 tablet 3   No facility-administered medications prior to visit.    No Known Allergies  ROS Review of Systems  Constitutional: Negative for fatigue and unexpected weight change.  Eyes: Negative for visual disturbance.  Respiratory: Negative for cough, chest tightness, shortness of breath and wheezing.   Cardiovascular: Negative for chest pain, palpitations and leg swelling.  Neurological: Negative for dizziness, seizures, syncope, weakness, light-headedness and headaches.      Objective:    Physical Exam Constitutional:      Appearance: She is well-developed.  Eyes:     Pupils: Pupils are equal, round, and reactive to light.  Neck:     Thyroid: No thyromegaly.     Vascular: No JVD.  Cardiovascular:     Rate and Rhythm: Normal rate and regular rhythm.     Heart sounds: No gallop.   Pulmonary:      Effort: Pulmonary effort is normal. No respiratory distress.     Breath sounds: Normal breath sounds. No wheezing or rales.  Musculoskeletal:     Cervical back: Neck supple.     Right lower leg: No edema.  Left lower leg: No edema.     Comments: Right elbow reveals full range of motion.  She has approximately 1 x 1 cm mobile nontender rounded mass subcutaneously on the medial aspect of the elbow.  No evidence for any olecranon bursitis.  No bony tenderness.  Neurological:     Mental Status: She is alert.     BP (!) 150/80 (BP Location: Left Arm, Patient Position: Sitting, Cuff Size: Normal)   Pulse 64   Temp 97.6 F (36.4 C) (Oral)   Wt 136 lb 11.2 oz (62 kg)   SpO2 97%   BMI 27.61 kg/m  Wt Readings from Last 3 Encounters:  04/20/21 136 lb 11.2 oz (62 kg)  02/08/21 137 lb 1.6 oz (62.2 kg)  01/11/21 137 lb (62.1 kg)     Health Maintenance Due  Topic Date Due  . COVID-19 Vaccine (3 - Booster for Pfizer series) 09/01/2020    There are no preventive care reminders to display for this patient.  Lab Results  Component Value Date   TSH 0.329 (L) 07/16/2010   Lab Results  Component Value Date   WBC 12.1 (H) 03/10/2020   HGB 13.6 03/10/2020   HCT 39.8 03/10/2020   MCV 89.6 03/10/2020   PLT 290 03/10/2020   Lab Results  Component Value Date   NA 138 01/12/2021   K 4.1 01/12/2021   CO2 33 (H) 01/12/2021   GLUCOSE 83 01/12/2021   BUN 12 01/12/2021   CREATININE 0.66 01/12/2021   BILITOT 0.5 03/10/2020   ALKPHOS 48 03/10/2020   AST 22 03/10/2020   ALT 17 03/10/2020   PROT 6.8 03/10/2020   ALBUMIN 3.5 03/10/2020   CALCIUM 9.6 01/12/2021   ANIONGAP 11 03/10/2020   GFR 90.28 01/12/2021   Lab Results  Component Value Date   CHOL 217 (H) 01/29/2020   Lab Results  Component Value Date   HDL 52.90 01/29/2020   Lab Results  Component Value Date   LDLCALC 124 (H) 01/29/2020   Lab Results  Component Value Date   TRIG 200.0 (H) 01/29/2020   Lab Results   Component Value Date   CHOLHDL 4 01/29/2020   Lab Results  Component Value Date   HGBA1C 6.2 01/12/2021      Assessment & Plan:   #1 hypertension poorly controlled.  Repeat left arm seated by me after rest 160/90.  Patient is convinced she has "whitecoat syndrome ".  She has had consistently better readings at home.  We strongly advised that she bring her cuff with follow-up visit in 1 month.  We did discuss possible increase in lisinopril HCTZ or additional medication but she would like to bring back her cuff.  In the meantime increase walking and keep sodium intake less than 2400 mg daily -We refilled her lisinopril HCTZ for 1 year  #2 right elbow mass.  Suspect benign.  Patient is specifically requesting orthopedic referral even though we have reassured her we think this is benign and we will set this up  Meds ordered this encounter  Medications  . lisinopril-hydrochlorothiazide (ZESTORETIC) 20-12.5 MG tablet    Sig: Take 1 tablet by mouth daily.    Dispense:  90 tablet    Refill:  3    Follow-up: Return in about 1 month (around 05/21/2021).    Carolann Littler, MD

## 2021-04-20 NOTE — Patient Instructions (Signed)
Get new home BP cuff  BRING your cuff in here to compare with ours and set up one month follow up.  Exercise- Eg walking   Keep sodium intake < 2,400 mg daily

## 2021-05-10 ENCOUNTER — Encounter: Payer: Self-pay | Admitting: Neurology

## 2021-05-10 ENCOUNTER — Ambulatory Visit: Payer: Medicare Other | Admitting: Neurology

## 2021-05-10 VITALS — BP 140/76 | HR 75 | Ht 59.0 in | Wt 127.0 lb

## 2021-05-10 DIAGNOSIS — G40409 Other generalized epilepsy and epileptic syndromes, not intractable, without status epilepticus: Secondary | ICD-10-CM | POA: Diagnosis not present

## 2021-05-10 MED ORDER — LEVETIRACETAM 500 MG PO TABS: 1000.0000 mg | ORAL_TABLET | Freq: Two times a day (BID) | ORAL | 4 refills | Status: DC

## 2021-05-10 MED ORDER — DIVALPROEX SODIUM 500 MG PO DR TAB: DELAYED_RELEASE_TABLET | ORAL | 4 refills | Status: DC

## 2021-05-10 NOTE — Progress Notes (Signed)
I have read the note, and I agree with the clinical assessment and plan.  Rayann Jolley K Zala Degrasse   

## 2021-05-10 NOTE — Patient Instructions (Signed)
Continue current medications  Check labs today  Call for seizures, see you back in 1 year

## 2021-05-10 NOTE — Progress Notes (Signed)
PATIENT: Glenda Francis DOB: 06/08/1951  REASON FOR VISIT: follow up HISTORY FROM: patient  HISTORY OF PRESENT ILLNESS: Today 05/10/21 Glenda Francis is a 70 year old female with history of well-controlled seizures on Keppra and Depakote. No seizures since last seen.  Denies medication side effect.  Lives alone, drives a car.  Denies any changes to her health.  Is on medication for her blood pressure, careful to stand slowly.  Here today accompanied by her son.  Update 09/17/2019 SS: Glenda Francis is a 70 year old female with history of well-controlled seizures on Keppra and Depakote.  She continues to tolerate her medications well.  She has not had recurrent seizure in nearly 10 years.  She lives alone, and she is able to operate a motor vehicle.  She is able to perform all of her own ADLs.  She has routine care with her OB/GYN and primary doctor.  She denies any trouble ambulating or any falls.  She denies any new problems or concerns.  She presents today for follow-up unaccompanied.  HISTORY 03/18/2019 Dr. Jannifer Franklin: Glenda Francis is a 70 year old right-handed Hispanic female with a history of well-controlled seizures on Keppra and Depakote.  She tolerates his medications well, she has not had any seizures since last seen.  She is able to operate a motor vehicle.  She did have routine blood work done in February 2020.  She reports that she tries to exercise on a regular basis, she is able to sleep well at night.  No other new medical issues have come up since last seen.   REVIEW OF SYSTEMS: Out of a complete 14 system review of symptoms, the patient complains only of the following symptoms, and all other reviewed systems are negative.  Seizures  ALLERGIES: No Known Allergies  HOME MEDICATIONS: Outpatient Medications Prior to Visit  Medication Sig Dispense Refill   lisinopril-hydrochlorothiazide (ZESTORETIC) 20-12.5 MG tablet Take 1 tablet by mouth daily. 90 tablet 3   divalproex (DEPAKOTE) 500 MG  DR tablet Take 1 tablet by mouth twice daily 180 tablet 0   levETIRAcetam (KEPPRA) 500 MG tablet Take 2 tablets (1,000 mg total) by mouth every 12 (twelve) hours. 360 tablet 0   No facility-administered medications prior to visit.    PAST MEDICAL HISTORY: Past Medical History:  Diagnosis Date   Anemia    as a child   Asthma    childhood   Cervicogenic headache 08/20/2015   Left side   Chicken pox    Depression    when son passed away   Family history of colon cancer    Family history of ovarian cancer    Family history of pancreatic cancer    Family history of stomach cancer    Family history of uterine cancer    Hypertension    Seizures ([place]) 1   started in 07/2008/ last seizure was in 2012    PAST SURGICAL HISTORY: Past Surgical History:  Procedure Laterality Date   ABDOMINAL HYSTERECTOMY  1989   partial, menorrhagia   CATARACT EXTRACTION Bilateral 10/26/2015, 12-04/2015   laser   COLONOSCOPY      FAMILY HISTORY: Family History  Problem Relation Age of Onset   Cancer Mother        uterine,ovarian   Uterine cancer Mother    Ovarian cancer Mother    Cancer Brother        colon-PASSEDA WAY 85, pancreatic   Colon cancer Brother    Pancreatic cancer Brother    Cancer Brother  colon,stomach,brain   Colon cancer Brother    Other Sister        Pos for Lynch syndrome   Colon cancer Other        died at 54   Asthma Other    Hyperlipidemia Other    Cancer Maternal Aunt        uterine and ovarian   Uterine cancer Maternal Aunt    Ovarian cancer Maternal Aunt    Cancer Maternal Aunt        ovarian and uterine   Uterine cancer Maternal Aunt    Ovarian cancer Maternal Aunt    Uterine cancer Other        died at 46   Ovarian cancer Other    Colon cancer Cousin        6 maternal cousins with colon cancer    SOCIAL HISTORY: Social History   Socioeconomic History   Marital status: Divorced    Spouse name: Not on file   Number of children: 3   Years  of education: college   Highest education level: Not on file  Occupational History   Occupation: retired  Tobacco Use   Smoking status: Former    Pack years: 0.00   Smokeless tobacco: Never   Tobacco comments:    only Art gallery manager Use: Never used  Substance and Sexual Activity   Alcohol use: Yes    Alcohol/week: 7.0 standard drinks    Types: 7 Glasses of wine per week    Comment: 1 GLASS WINE WITH DINNER   Drug use: No   Sexual activity: Not Currently  Other Topics Concern   Not on file  Social History Narrative   Patient is divorced   Patient  has 3 children.   Patient is retired   Patient has some college.   Patient is currently doing yoga and water aerobics.   Patient drinks 1-2 cups of caffeine daily.   Patient is right handed.    Social Determinants of Health   Financial Resource Strain: Low Risk    Difficulty of Paying Living Expenses: Not hard at all  Food Insecurity: No Food Insecurity   Worried About Charity fundraiser in the Last Year: Never true   [place] in the Last Year: Never true  Transportation Needs: No Transportation Needs   Lack of Transportation (Medical): No   Lack of Transportation (Non-Medical): No  Physical Activity: Insufficiently Active   Days of Exercise per Week: 4 days   Minutes of Exercise per Session: 30 min  Stress: No Stress Concern Present   Feeling of Stress : Not at all  Social Connections: Socially Isolated   Frequency of Communication with Friends and Family: More than three times a week   Frequency of Social Gatherings with Friends and Family: Once a week   Attends Religious Services: Never   Marine scientist or Organizations: No   Attends Archivist Meetings: Never   Marital Status: Divorced  Human resources officer Violence: Not At Risk   Fear of Current or Ex-Partner: No   Emotionally Abused: No   Physically Abused: No   Sexually Abused: No   PHYSICAL EXAM  Vitals:   05/10/21 1330   BP: 140/76  Pulse: 75  Weight: 127 lb (57.6 kg)  Height: 4\' 11"  (1.499 m)    Body mass index is 25.65 kg/m.  Generalized: Well developed, in no acute distress   Neurological examination  Mentation:  Alert oriented to time, place, history taking. Follows all commands speech and language fluent Cranial nerve II-XII: Pupils were equal round reactive to light. Extraocular movements were full, visual field were full on confrontational test. Facial sensation and strength were normal. Head turning and shoulder shrug  were normal and symmetric. Motor: The motor testing reveals 5 over 5 strength of all 4 extremities. Good symmetric motor tone is noted throughout.  Sensory: Sensory testing is intact to soft touch on all 4 extremities. No evidence of extinction is noted.  Coordination: Cerebellar testing reveals good finger-nose-finger and heel-to-shin bilaterally.  Gait and station: Gait is normal. Tandem gait is normal.  Reflexes: Deep tendon reflexes are symmetric and normal bilaterally.   DIAGNOSTIC DATA (LABS, IMAGING, TESTING) - I reviewed patient records, labs, notes, testing and imaging myself where available.  Lab Results  Component Value Date   WBC 12.1 (H) 03/10/2020   HGB 13.6 03/10/2020   HCT 39.8 03/10/2020   MCV 89.6 03/10/2020   PLT 290 03/10/2020      Component Value Date/Time   NA 138 01/12/2021 0841   NA 139 09/17/2019 1432   K 4.1 01/12/2021 0841   CL 98 01/12/2021 0841   CO2 33 (H) 01/12/2021 0841   GLUCOSE 83 01/12/2021 0841   BUN 12 01/12/2021 0841   BUN 9 09/17/2019 1432   CREATININE 0.66 01/12/2021 0841   CALCIUM 9.6 01/12/2021 0841   PROT 6.8 03/10/2020 0302   PROT 7.1 09/17/2019 1432   ALBUMIN 3.5 03/10/2020 0302   ALBUMIN 4.1 09/17/2019 1432   AST 22 03/10/2020 0302   ALT 17 03/10/2020 0302   ALKPHOS 48 03/10/2020 0302   BILITOT 0.5 03/10/2020 0302   BILITOT 0.2 09/17/2019 1432   GFRNONAA >60 03/10/2020 0302   GFRAA >60 03/10/2020 0302   Lab  Results  Component Value Date   CHOL 217 (H) 01/29/2020   HDL 52.90 01/29/2020   LDLCALC 124 (H) 01/29/2020   TRIG 200.0 (H) 01/29/2020   CHOLHDL 4 01/29/2020   Lab Results  Component Value Date   HGBA1C 6.2 01/12/2021   No results found for: VITAMINB12 Lab Results  Component Value Date   TSH 0.329 (L) 07/16/2010    ASSESSMENT AND PLAN 70 y.o. year old female  has a past medical history of Anemia, Asthma, Cervicogenic headache (08/20/2015), Chicken pox, Depression, Family history of colon cancer, Family history of ovarian cancer, Family history of pancreatic cancer, Family history of stomach cancer, Family history of uterine cancer, Hypertension, and Seizures ([place]) (1). here with:  1.  History of seizures well-controlled  -Doing well, no recurrent seizures -Continue Depakote, Keppra -Check routine labs today -Call for seizure activity, otherwise follow-up 1 year or sooner if needed  Butler Denmark, Laqueta Jean, DNP 05/10/2021, 2:08 PM [facility] [address] [place], [place] [address] [phone]

## 2021-05-11 LAB — CBC WITH DIFFERENTIAL/PLATELET
Basophils Absolute: 0 10*3/uL (ref 0.0–0.2)
Basos: 1 %
EOS (ABSOLUTE): 0.1 10*3/uL (ref 0.0–0.4)
Eos: 1 %
Hematocrit: 39.3 % (ref 34.0–46.6)
Hemoglobin: 13.5 g/dL (ref 11.1–15.9)
Immature Grans (Abs): 0 10*3/uL (ref 0.0–0.1)
Immature Granulocytes: 0 %
Lymphocytes Absolute: 2.8 10*3/uL (ref 0.7–3.1)
Lymphs: 52 %
MCH: 30.5 pg (ref 26.6–33.0)
MCHC: 34.4 g/dL (ref 31.5–35.7)
MCV: 89 fL (ref 79–97)
Monocytes Absolute: 0.6 10*3/uL (ref 0.1–0.9)
Monocytes: 11 %
Neutrophils Absolute: 2 10*3/uL (ref 1.4–7.0)
Neutrophils: 35 %
Platelets: 289 10*3/uL (ref 150–450)
RBC: 4.42 x10E6/uL (ref 3.77–5.28)
RDW: 12.6 % (ref 11.7–15.4)
WBC: 5.6 10*3/uL (ref 3.4–10.8)

## 2021-05-14 ENCOUNTER — Encounter: Payer: Self-pay | Admitting: Family Medicine

## 2021-05-14 ENCOUNTER — Other Ambulatory Visit: Payer: Self-pay

## 2021-05-14 ENCOUNTER — Ambulatory Visit (INDEPENDENT_AMBULATORY_CARE_PROVIDER_SITE_OTHER): Payer: Medicare Other | Admitting: Family Medicine

## 2021-05-14 VITALS — BP 160/90 | HR 92 | Temp 97.9°F | Wt 136.5 lb

## 2021-05-14 DIAGNOSIS — I10 Essential (primary) hypertension: Secondary | ICD-10-CM | POA: Diagnosis not present

## 2021-05-14 NOTE — Patient Instructions (Signed)
Monitor blood pressure and be in touch if consistently > 574 systolic (top number) or 90 diastolic (bottom number).

## 2021-05-14 NOTE — Progress Notes (Signed)
Established Patient Office Visit  Subjective:  Patient ID: Glenda Francis, female    DOB: 07-Aug-1951  Age: 70 y.o. MRN: [id number]  CC:  Chief Complaint  Patient presents with   Follow-up    Hypertension, pt has home BP readings with her    HPI [name] presents for follow-up hypertension.  She is currently on lisinopril HCTZ and compliant with medication.  She has gotten a cuff for home readings as instructed and brings in a log of several readings.  These are fairly consistently below 366 systolic with several readings in the 120 or even less systolic range and mostly 44I and 34V diastolic.  Denies any headaches.  No dizziness.  She has been watching her sodium intake very carefully.  Recent blood pressure at neurology around 425 systolic.  No alcohol.  Past Medical History:  Diagnosis Date   Anemia    as a child   Asthma    childhood   Cervicogenic headache 08/20/2015   Left side   Chicken pox    Depression    when son passed away   Family history of colon cancer    Family history of ovarian cancer    Family history of pancreatic cancer    Family history of stomach cancer    Family history of uterine cancer    Hypertension    Seizures ([place]) 1   started in 07/2008/ last seizure was in 2012    Past Surgical History:  Procedure Laterality Date   ABDOMINAL HYSTERECTOMY  1989   partial, menorrhagia   CATARACT EXTRACTION Bilateral 10/26/2015, 12-04/2015   laser   COLONOSCOPY      Family History  Problem Relation Age of Onset   Cancer Mother        uterine,ovarian   Uterine cancer Mother    Ovarian cancer Mother    Cancer Brother        colon-PASSEDA WAY 20, pancreatic   Colon cancer Brother    Pancreatic cancer Brother    Cancer Brother        colon,stomach,brain   Colon cancer Brother    Other Sister        Pos for Lynch syndrome   Colon cancer Other        died at 91   Asthma Other    Hyperlipidemia Other    Cancer Maternal Aunt        uterine and  ovarian   Uterine cancer Maternal Aunt    Ovarian cancer Maternal Aunt    Cancer Maternal Aunt        ovarian and uterine   Uterine cancer Maternal Aunt    Ovarian cancer Maternal Aunt    Uterine cancer Other        died at 55   Ovarian cancer Other    Colon cancer Cousin        6 maternal cousins with colon cancer    Social History   Socioeconomic History   Marital status: Divorced    Spouse name: Not on file   Number of children: 3   Years of education: college   Highest education level: Not on file  Occupational History   Occupation: retired  Tobacco Use   Smoking status: Former    Pack years: 0.00   Smokeless tobacco: Never   Tobacco comments:    only Art gallery manager Use: Never used  Substance and Sexual Activity   Alcohol use: Yes    Alcohol/week:  7.0 standard drinks    Types: 7 Glasses of wine per week    Comment: 1 GLASS WINE WITH DINNER   Drug use: No   Sexual activity: Not Currently  Other Topics Concern   Not on file  Social History Narrative   Patient is divorced   Patient  has 3 children.   Patient is retired   Patient has some college.   Patient is currently doing yoga and water aerobics.   Patient drinks 1-2 cups of caffeine daily.   Patient is right handed.    Social Determinants of Health   Financial Resource Strain: Low Risk    Difficulty of Paying Living Expenses: Not hard at all  Food Insecurity: No Food Insecurity   Worried About Charity fundraiser in the Last Year: Never true   [place] in the Last Year: Never true  Transportation Needs: No Transportation Needs   Lack of Transportation (Medical): No   Lack of Transportation (Non-Medical): No  Physical Activity: Insufficiently Active   Days of Exercise per Week: 4 days   Minutes of Exercise per Session: 30 min  Stress: No Stress Concern Present   Feeling of Stress : Not at all  Social Connections: Socially Isolated   Frequency of Communication with Friends and  Family: More than three times a week   Frequency of Social Gatherings with Friends and Family: Once a week   Attends Religious Services: Never   Marine scientist or Organizations: No   Attends Music therapist: Never   Marital Status: Divorced  Human resources officer Violence: Not At Risk   Fear of Current or Ex-Partner: No   Emotionally Abused: No   Physically Abused: No   Sexually Abused: No    Outpatient Medications Prior to Visit  Medication Sig Dispense Refill   divalproex (DEPAKOTE) 500 MG DR tablet Take 1 tablet by mouth twice daily 180 tablet 4   levETIRAcetam (KEPPRA) 500 MG tablet Take 2 tablets (1,000 mg total) by mouth every 12 (twelve) hours. 360 tablet 4   lisinopril-hydrochlorothiazide (ZESTORETIC) 20-12.5 MG tablet Take 1 tablet by mouth daily. 90 tablet 3   No facility-administered medications prior to visit.    No Known Allergies  ROS Review of Systems  Constitutional:  Negative for fatigue.  Eyes:  Negative for visual disturbance.  Respiratory:  Negative for cough, chest tightness, shortness of breath and wheezing.   Cardiovascular:  Negative for chest pain, palpitations and leg swelling.  Neurological:  Negative for dizziness, seizures, syncope, weakness, light-headedness and headaches.     Objective:    Physical Exam Constitutional:      Appearance: She is well-developed.  Eyes:     Pupils: Pupils are equal, round, and reactive to light.  Neck:     Thyroid: No thyromegaly.     Vascular: No JVD.  Cardiovascular:     Rate and Rhythm: Normal rate and regular rhythm.     Heart sounds:    No gallop.  Pulmonary:     Effort: Pulmonary effort is normal. No respiratory distress.     Breath sounds: Normal breath sounds. No wheezing or rales.  Musculoskeletal:     Cervical back: Neck supple.     Right lower leg: No edema.     Left lower leg: No edema.  Neurological:     Mental Status: She is alert.    BP (!) 160/90 (BP Location: Left  Arm, Patient Position: Sitting, Cuff Size: Normal)  Pulse 92   Temp 97.9 F (36.6 C) (Oral)   Wt 136 lb 8 oz (61.9 kg)   SpO2 97%   BMI 27.57 kg/m  Wt Readings from Last 3 Encounters:  05/14/21 136 lb 8 oz (61.9 kg)  05/10/21 127 lb (57.6 kg)  04/20/21 136 lb 11.2 oz (62 kg)     Health Maintenance Due  Topic Date Due   Zoster Vaccines- Shingrix (1 of 2) Never done   COVID-19 Vaccine (3 - Booster for Pfizer series) 09/01/2020    There are no preventive care reminders to display for this patient.  Lab Results  Component Value Date   TSH 0.329 (L) 07/16/2010   Lab Results  Component Value Date   WBC 5.6 05/10/2021   HGB 13.5 05/10/2021   HCT 39.3 05/10/2021   MCV 89 05/10/2021   PLT 289 05/10/2021   Lab Results  Component Value Date   NA 138 01/12/2021   K 4.1 01/12/2021   CO2 33 (H) 01/12/2021   GLUCOSE 83 01/12/2021   BUN 12 01/12/2021   CREATININE 0.66 01/12/2021   BILITOT 0.5 03/10/2020   ALKPHOS 48 03/10/2020   AST 22 03/10/2020   ALT 17 03/10/2020   PROT 6.8 03/10/2020   ALBUMIN 3.5 03/10/2020   CALCIUM 9.6 01/12/2021   ANIONGAP 11 03/10/2020   GFR 90.28 01/12/2021   Lab Results  Component Value Date   CHOL 217 (H) 01/29/2020   Lab Results  Component Value Date   HDL 52.90 01/29/2020   Lab Results  Component Value Date   LDLCALC 124 (H) 01/29/2020   Lab Results  Component Value Date   TRIG 200.0 (H) 01/29/2020   Lab Results  Component Value Date   CHOLHDL 4 01/29/2020   Lab Results  Component Value Date   HGBA1C 6.2 01/12/2021      Assessment & Plan:   Hypertension.  Poorly controlled by today's reading but she is consistently better controlled at home.  She compare readings with our cuff and hers.  She obtained a reading of 166/93 with her cuff and our cuff 160/90  -Given the fact that she has very well-controlled home readings suspect whitecoat syndrome.  Continue close monitoring and be in touch for systolic readings greater  than 140 consistently or greater than 90 diastolic.  No orders of the defined types were placed in this encounter.   Follow-up: Return in about 3 months (around 08/14/2021).    Carolann Littler, MD

## 2021-05-17 DIAGNOSIS — M25521 Pain in right elbow: Secondary | ICD-10-CM | POA: Diagnosis not present

## 2021-05-17 DIAGNOSIS — M25829 Other specified joint disorders, unspecified elbow: Secondary | ICD-10-CM | POA: Diagnosis not present

## 2021-06-02 ENCOUNTER — Other Ambulatory Visit: Payer: Self-pay | Admitting: Radiology

## 2021-06-02 DIAGNOSIS — N644 Mastodynia: Secondary | ICD-10-CM | POA: Diagnosis not present

## 2021-06-22 ENCOUNTER — Other Ambulatory Visit: Payer: Self-pay

## 2021-06-22 ENCOUNTER — Ambulatory Visit
Admission: RE | Admit: 2021-06-22 | Discharge: 2021-06-22 | Disposition: A | Discharge status: Home / Self Care | Payer: Medicare Other | Source: Ambulatory Visit | Attending: Radiology | Admitting: Radiology

## 2021-06-22 ENCOUNTER — Ambulatory Visit: Payer: Medicare Other

## 2021-06-22 DIAGNOSIS — N644 Mastodynia: Secondary | ICD-10-CM

## 2021-06-22 DIAGNOSIS — R922 Inconclusive mammogram: Secondary | ICD-10-CM | POA: Diagnosis not present

## 2021-06-30 ENCOUNTER — Other Ambulatory Visit: Payer: Self-pay | Admitting: Orthopedic Surgery

## 2021-06-30 DIAGNOSIS — M25521 Pain in right elbow: Secondary | ICD-10-CM | POA: Diagnosis not present

## 2021-06-30 DIAGNOSIS — D1721 Benign lipomatous neoplasm of skin and subcutaneous tissue of right arm: Secondary | ICD-10-CM | POA: Diagnosis not present

## 2021-06-30 DIAGNOSIS — M25821 Other specified joint disorders, right elbow: Secondary | ICD-10-CM | POA: Diagnosis not present

## 2021-09-02 ENCOUNTER — Other Ambulatory Visit: Payer: Self-pay

## 2021-09-03 ENCOUNTER — Encounter: Payer: Self-pay | Admitting: Family Medicine

## 2021-09-03 ENCOUNTER — Ambulatory Visit (INDEPENDENT_AMBULATORY_CARE_PROVIDER_SITE_OTHER): Payer: Medicare Other | Admitting: Family Medicine

## 2021-09-03 VITALS — BP 132/92 | HR 73 | Temp 98.5°F | Wt 137.6 lb

## 2021-09-03 DIAGNOSIS — M545 Low back pain, unspecified: Secondary | ICD-10-CM

## 2021-09-03 DIAGNOSIS — R300 Dysuria: Secondary | ICD-10-CM

## 2021-09-03 DIAGNOSIS — I10 Essential (primary) hypertension: Secondary | ICD-10-CM | POA: Diagnosis not present

## 2021-09-03 DIAGNOSIS — N3001 Acute cystitis with hematuria: Secondary | ICD-10-CM

## 2021-09-03 LAB — POCT URINALYSIS DIPSTICK
Bilirubin, UA: NEGATIVE
Glucose, UA: NEGATIVE
Ketones, UA: NEGATIVE
Nitrite, UA: NEGATIVE
Protein, UA: POSITIVE — AB
Spec Grav, UA: 1.015 (ref 1.010–1.025)
Urobilinogen, UA: NEGATIVE E.U./dL — AB
pH, UA: 7.5 (ref 5.0–8.0)

## 2021-09-03 MED ORDER — SULFAMETHOXAZOLE-TRIMETHOPRIM 800-160 MG PO TABS: 1.0000 | ORAL_TABLET | Freq: Two times a day (BID) | ORAL | 0 refills | Status: DC

## 2021-09-03 NOTE — Progress Notes (Signed)
Subjective:    Patient ID: Glenda Francis, adult    DOB: 1951/11/10, 70 y.o.   MRN: [id number]  Chief Complaint  Patient presents with   Urinary Tract Infection    Painful while urinating, pain in lower back started Tuesday. Has tried AZO but still painful    HPI Patient was seen today for acute concern.  Patient endorses suprapubic pain, back pain, dysuria, dizziness since Tuesday.  Patient tried OTC Azo and increasing intake of water and cranberry juice.  Patient denies fever, chills, nausea, vomiting, constipation.  Patient with concern symptoms may be related to diverticulitis as she has had this in the past.  Past Medical History:  Diagnosis Date   Anemia    as a child   Asthma    childhood   Cervicogenic headache 08/20/2015   Left side   Chicken pox    Depression    when son passed away   Family history of colon cancer    Family history of ovarian cancer    Family history of pancreatic cancer    Family history of stomach cancer    Family history of uterine cancer    Hypertension    Seizures ([place]) 1   started in 07/2008/ last seizure was in 2012    No Known Allergies  ROS General: Denies fever, chills, night sweats, changes in weight, changes in appetite HEENT: Denies headaches, ear pain, changes in vision, rhinorrhea, sore throat CV: Denies CP, palpitations, SOB, orthopnea Pulm: Denies SOB, cough, wheezing GI: Denies abdominal pain, nausea, vomiting, diarrhea, constipation GU: Denies hematuria. + Dysuria, suprapubic pain, frequency Msk: Denies muscle cramps, joint pains Neuro: Denies weakness, numbness, tingling Skin: Denies rashes, bruising Psych: Denies depression, anxiety, hallucinations    Objective:    Blood pressure (!) 132/92, pulse 73, temperature 98.5 F (36.9 C), temperature source Oral, weight 137 lb 9.6 oz (62.4 kg), SpO2 98 %.  Gen. Pleasant, well-nourished, in no distress, normal affect   HEENT: [place]/AT, face symmetric, conjunctiva clear, no scleral  icterus, PERRLA, EOMI, nares patent without drainage Lungs: no accessory muscle use Cardiovascular: RRR, no peripheral edema Abdomen: BS present, soft, suprapubic tenderness, ND, no hepatosplenomegaly.  Left CVA tenderness Musculoskeletal: No deformities, no cyanosis or clubbing, normal tone Neuro:  A&Ox3, CN II-XII intact, normal gait Skin:  Warm, no lesions/ rash   Wt Readings from Last 3 Encounters:  09/03/21 137 lb 9.6 oz (62.4 kg)  05/14/21 136 lb 8 oz (61.9 kg)  05/10/21 127 lb (57.6 kg)    Lab Results  Component Value Date   WBC 5.6 05/10/2021   HGB 13.5 05/10/2021   HCT 39.3 05/10/2021   PLT 289 05/10/2021   GLUCOSE 83 01/12/2021   CHOL 217 (H) 01/29/2020   TRIG 200.0 (H) 01/29/2020   HDL 52.90 01/29/2020   LDLCALC 124 (H) 01/29/2020   ALT 17 03/10/2020   AST 22 03/10/2020   NA 138 01/12/2021   K 4.1 01/12/2021   CL 98 01/12/2021   CREATININE 0.66 01/12/2021   BUN 12 01/12/2021   CO2 33 (H) 01/12/2021   TSH 0.329 (L) 07/16/2010   HGBA1C 6.2 01/12/2021    Assessment/Plan:  Acute cystitis with hematuria  -UA with 1+ RBCs, 1+ leuks, protein -Start Bactrim given concern for Pyelo -We will send for urine culture.  Adjust antibiotics if needed based on culture results. -Given handout -Given strict precautions - Plan: sulfamethoxazole-trimethoprim (BACTRIM DS) 800-160 MG tablet  Dysuria  - Plan: POCT urinalysis dipstick, Culture, Urine  Acute bilateral low back pain without sciatica -Likely 2/2 UTI -Discussed supportive care including heat, hydration -We will start ABX -Given precautions for continued or worsening symptoms  - Plan: POCT urinalysis dipstick  Essential hypertension -Likely elevated 2/2 pain/discomfort due to UTI -Continue to monitor -Continue lifestyle modifications -Continue current medications including lisinopril-hydrochlorothiazide 20-12.5 mg daily  F/u as needed  Grier Mitts, MD

## 2021-09-06 ENCOUNTER — Telehealth: Payer: Self-pay | Admitting: Family Medicine

## 2021-09-06 DIAGNOSIS — N3001 Acute cystitis with hematuria: Secondary | ICD-10-CM

## 2021-09-06 LAB — URINE CULTURE
MICRO NUMBER:: 12475434
SPECIMEN QUALITY:: ADEQUATE

## 2021-09-06 MED ORDER — AMOXICILLIN-POT CLAVULANATE 500-125 MG PO TABS: 1.0000 | ORAL_TABLET | Freq: Two times a day (BID) | ORAL | 0 refills | Status: DC

## 2021-09-06 NOTE — Telephone Encounter (Signed)
Patient called with no answer.  VM left.  Urine culture with E. coli resistant to Bactrim.  Rx for Augmentin sent to pharmacy.  Patient to stop Bactrim and start new medication.  Grier Mitts, MD

## 2021-09-08 MED ORDER — AMOXICILLIN-POT CLAVULANATE 500-125 MG PO TABS
1.0000 | ORAL_TABLET | Freq: Two times a day (BID) | ORAL | 0 refills | Status: AC
Start: 2021-09-08 — End: 2021-09-15

## 2021-09-08 NOTE — Telephone Encounter (Addendum)
Debra triage nurse is calling and the augmentin needs to be sent to [address] in [place] and not [facility]

## 2021-09-08 NOTE — Addendum Note (Signed)
Addended by: Anderson Malta on: 09/08/2021 10:59 AM   Modules accepted: Orders

## 2021-09-08 NOTE — Telephone Encounter (Signed)
RX sent to [facility]

## 2021-09-16 ENCOUNTER — Ambulatory Visit: Payer: Medicare Other

## 2021-09-20 DIAGNOSIS — Z1231 Encounter for screening mammogram for malignant neoplasm of breast: Secondary | ICD-10-CM | POA: Diagnosis not present

## 2021-09-28 ENCOUNTER — Ambulatory Visit: Payer: Medicare Other

## 2021-09-29 ENCOUNTER — Ambulatory Visit (INDEPENDENT_AMBULATORY_CARE_PROVIDER_SITE_OTHER): Payer: Medicare Other | Admitting: Family Medicine

## 2021-09-29 ENCOUNTER — Other Ambulatory Visit: Payer: Self-pay

## 2021-09-29 VITALS — BP 122/74 | HR 88 | Temp 98.0°F | Wt 136.0 lb

## 2021-09-29 DIAGNOSIS — R300 Dysuria: Secondary | ICD-10-CM

## 2021-09-29 LAB — POCT URINALYSIS DIPSTICK
Blood, UA: POSITIVE
Glucose, UA: NEGATIVE
Ketones, UA: NEGATIVE
Nitrite, UA: POSITIVE
Protein, UA: POSITIVE — AB
Spec Grav, UA: 1.015 (ref 1.010–1.025)
Urobilinogen, UA: 2 E.U./dL — AB
pH, UA: 7 (ref 5.0–8.0)

## 2021-09-29 MED ORDER — CEPHALEXIN 500 MG PO CAPS: 500.0000 mg | ORAL_CAPSULE | Freq: Four times a day (QID) | ORAL | 0 refills | Status: DC

## 2021-09-29 NOTE — Addendum Note (Signed)
Addended by: Rebecca Eaton on: 09/29/2021 02:06 PM   Modules accepted: Orders

## 2021-09-29 NOTE — Progress Notes (Signed)
Established Patient Office Visit  Subjective:  Patient ID: Glenda Francis, adult    DOB: [birth year]  Age: 70 y.o. MRN: [id number]  CC:  Chief Complaint  Patient presents with   Recurrent UTI    Antibiotics do not seem to be helping, sever low back pain and dysuria    HPI Glenda Francis presents for recurrent urinary symptoms.  She relates some low back pain and suprapubic pressure and severe burning with urination with onset Monday.  She was seen back October 7 for possible UTI and treated with Septra.  Her culture came back positive for E. coli resistant to Septra.  She was switched at that point to Augmentin and did improve somewhat following that but is not sure if she was ever fully better.  No nausea or vomiting.  No gross hematuria.  Past Medical History:  Diagnosis Date   Anemia    as a child   Asthma    childhood   Cervicogenic headache 08/20/2015   Left side   Chicken pox    Depression    when son passed away   Family history of colon cancer    Family history of ovarian cancer    Family history of pancreatic cancer    Family history of stomach cancer    Family history of uterine cancer    Hypertension    Seizures ([place]) 1   started in 07/2008/ last seizure was in 2012    Past Surgical History:  Procedure Laterality Date   ABDOMINAL HYSTERECTOMY  1989   partial, menorrhagia   CATARACT EXTRACTION Bilateral 10/26/2015, 12-04/2015   laser   COLONOSCOPY      Family History  Problem Relation Age of Onset   Cancer Mother        uterine,ovarian   Uterine cancer Mother    Ovarian cancer Mother    Cancer Brother        colon-PASSEDA WAY 79, pancreatic   Colon cancer Brother    Pancreatic cancer Brother    Cancer Brother        colon,stomach,brain   Colon cancer Brother    Other Sister        Pos for Lynch syndrome   Colon cancer Other        died at 77   Asthma Other    Hyperlipidemia Other    Cancer Maternal Aunt        uterine and ovarian   Uterine cancer  Maternal Aunt    Ovarian cancer Maternal Aunt    Cancer Maternal Aunt        ovarian and uterine   Uterine cancer Maternal Aunt    Ovarian cancer Maternal Aunt    Uterine cancer Other        died at 14   Ovarian cancer Other    Colon cancer Cousin        6 maternal cousins with colon cancer    Social History   Socioeconomic History   Marital status: Divorced    Spouse name: Not on file   Number of children: 3   Years of education: college   Highest education level: Not on file  Occupational History   Occupation: retired  Tobacco Use   Smoking status: Former   Smokeless tobacco: Never   Tobacco comments:    only Art gallery manager Use: Never used  Substance and Sexual Activity   Alcohol use: Yes    Alcohol/week: 7.0 standard drinks  Types: 7 Glasses of wine per week    Comment: 1 GLASS WINE WITH DINNER   Drug use: No   Sexual activity: Not Currently  Other Topics Concern   Not on file  Social History Narrative   Patient is divorced   Patient  has 3 children.   Patient is retired   Patient has some college.   Patient is currently doing yoga and water aerobics.   Patient drinks 1-2 cups of caffeine daily.   Patient is right handed.    Social Determinants of Health   Financial Resource Strain: Not on file  Food Insecurity: Not on file  Transportation Needs: Not on file  Physical Activity: Not on file  Stress: Not on file  Social Connections: Not on file  Intimate Partner Violence: Not on file    Outpatient Medications Prior to Visit  Medication Sig Dispense Refill   divalproex (DEPAKOTE) 500 MG DR tablet Take 1 tablet by mouth twice daily 180 tablet 4   levETIRAcetam (KEPPRA) 500 MG tablet Take 2 tablets (1,000 mg total) by mouth every 12 (twelve) hours. 360 tablet 4   lisinopril-hydrochlorothiazide (ZESTORETIC) 20-12.5 MG tablet Take 1 tablet by mouth daily. 90 tablet 3   No facility-administered medications prior to visit.    No Known  Allergies  ROS Review of Systems  Constitutional:  Negative for appetite change, chills and fever.  Gastrointestinal:  Negative for abdominal pain, constipation, diarrhea, nausea and vomiting.  Genitourinary:  Positive for dysuria and frequency.  Musculoskeletal:  Negative for back pain.  Neurological:  Negative for dizziness.     Objective:    Physical Exam Constitutional:      Appearance: She is well-developed.  HENT:     Head: Normocephalic and atraumatic.  Neck:     Thyroid: No thyromegaly.  Cardiovascular:     Rate and Rhythm: Normal rate and regular rhythm.     Heart sounds: Normal heart sounds.  Pulmonary:     Breath sounds: Normal breath sounds.  Musculoskeletal:     Cervical back: Neck supple.    BP 122/74 (BP Location: Left Arm, Patient Position: Sitting, Cuff Size: Normal)   Pulse 88   Temp 98 F (36.7 C) (Oral)   Wt 136 lb (61.7 kg)   SpO2 98%   BMI 27.47 kg/m  Wt Readings from Last 3 Encounters:  09/29/21 136 lb (61.7 kg)  09/03/21 137 lb 9.6 oz (62.4 kg)  05/14/21 136 lb 8 oz (61.9 kg)     Health Maintenance Due  Topic Date Due   Hepatitis C Screening  Never done   Zoster Vaccines- Shingrix (1 of 2) Never done   Pneumonia Vaccine 11+ Years old (1 - PCV) Never done   COVID-19 Vaccine (3 - Booster for Pfizer series) 05/27/2020   INFLUENZA VACCINE  06/28/2021    There are no preventive care reminders to display for this patient.  Lab Results  Component Value Date   TSH 0.329 (L) 07/16/2010   Lab Results  Component Value Date   WBC 5.6 05/10/2021   HGB 13.5 05/10/2021   HCT 39.3 05/10/2021   MCV 89 05/10/2021   PLT 289 05/10/2021   Lab Results  Component Value Date   NA 138 01/12/2021   K 4.1 01/12/2021   CO2 33 (H) 01/12/2021   GLUCOSE 83 01/12/2021   BUN 12 01/12/2021   CREATININE 0.66 01/12/2021   BILITOT 0.5 03/10/2020   ALKPHOS 48 03/10/2020   AST 22 03/10/2020  ALT 17 03/10/2020   PROT 6.8 03/10/2020   ALBUMIN 3.5  03/10/2020   CALCIUM 9.6 01/12/2021   ANIONGAP 11 03/10/2020   GFR 90.28 01/12/2021   Lab Results  Component Value Date   CHOL 217 (H) 01/29/2020   Lab Results  Component Value Date   HDL 52.90 01/29/2020   Lab Results  Component Value Date   LDLCALC 124 (H) 01/29/2020   Lab Results  Component Value Date   TRIG 200.0 (H) 01/29/2020   Lab Results  Component Value Date   CHOLHDL 4 01/29/2020   Lab Results  Component Value Date   HGBA1C 6.2 01/12/2021      Assessment & Plan:   Problem List Items Addressed This Visit   None Visit Diagnoses     Dysuria    -  Primary   Relevant Orders   POCT urinalysis dipstick (Completed)     Patient relates recurrent dysuria.  Recent E. coli UTI.  Patient nontoxic in appearance.  -Urine culture sent -Stay well-hydrated -Start cephalexin 500 mg 4 times daily for 7 days pending culture results -Also suggested over-the-counter Pyridium as needed for pain symptoms  Meds ordered this encounter  Medications   cephALEXin (KEFLEX) 500 MG capsule    Sig: Take 1 capsule (500 mg total) by mouth 4 (four) times daily.    Dispense:  28 capsule    Refill:  0    Follow-up: No follow-ups on file.    Carolann Littler, MD

## 2021-09-30 ENCOUNTER — Ambulatory Visit: Payer: Medicare Other

## 2021-09-30 ENCOUNTER — Telehealth: Payer: Self-pay

## 2021-09-30 NOTE — Telephone Encounter (Signed)
Called patient x3 for 145 MWV  no answer, VM was full. Patient can all to reschedule for next available appointment.  L.Terra Aveni,LPN

## 2021-10-01 ENCOUNTER — Ambulatory Visit: Payer: Medicare Other

## 2021-10-01 LAB — URINE CULTURE
MICRO NUMBER:: 12583942
SPECIMEN QUALITY:: ADEQUATE

## 2021-10-04 ENCOUNTER — Ambulatory Visit: Payer: Medicare Other

## 2021-10-15 ENCOUNTER — Ambulatory Visit (INDEPENDENT_AMBULATORY_CARE_PROVIDER_SITE_OTHER): Payer: Medicare Other

## 2021-10-15 VITALS — BP 129/80 | Ht 59.0 in | Wt 136.0 lb

## 2021-10-15 DIAGNOSIS — Z0000 Encounter for general adult medical examination without abnormal findings: Secondary | ICD-10-CM | POA: Diagnosis not present

## 2021-10-15 NOTE — Patient Instructions (Addendum)
Glenda Francis , Thank you for taking time to come for your Medicare Wellness Visit. I appreciate your ongoing commitment to your health goals. Please review the following plan we discussed and let me know if I can assist you in the future.   These are the goals we discussed:  Goals      Exercise 150 min/wk Moderate Activity     Patient would like to continue exercise/walking      Patient Stated     I would like to try to travel to [place] and to my country to see my family.        This is a list of the screening recommended for you and due dates:  Health Maintenance  Topic Date Due   COVID-19 Vaccine (3 - Booster for Pfizer series) 10/31/2021*   Zoster (Shingles) Vaccine (1 of 2) 01/15/2022*   Flu Shot  02/25/2022*   Pneumonia Vaccine (1 - PCV) 10/15/2022*   Hepatitis C Screening: USPSTF Recommendation to screen - Ages 18-79 yo.  10/15/2022*   Mammogram  09/23/2022   Tetanus Vaccine  08/31/2023   Colon Cancer Screening  09/25/2023   DEXA scan (bone density measurement)  Completed   HPV Vaccine  Aged Out  *Topic was postponed. The date shown is not the original due date.    Advanced directives: Yes  Conditions/risks identified: None  Next appointment: Follow up in one year for your annual wellness visit   Preventive Care 65 Years and Older, Female Preventive care refers to lifestyle choices and visits with your health care provider that can promote health and wellness. What does preventive care include? A yearly physical exam. This is also called an annual well check. Dental exams once or twice a year. Routine eye exams. Ask your health care provider how often you should have your eyes checked. Personal lifestyle choices, including: Daily care of your teeth and gums. Regular physical activity. Eating a healthy diet. Avoiding tobacco and drug use. Limiting alcohol use. Practicing safe sex. Taking low-dose aspirin every day. Taking vitamin and mineral supplements as  recommended by your health care provider. What happens during an annual well check? The services and screenings done by your health care provider during your annual well check will depend on your age, overall health, lifestyle risk factors, and family history of disease. Counseling  Your health care provider may ask you questions about your: Alcohol use. Tobacco use. Drug use. Emotional well-being. Home and relationship well-being. Sexual activity. Eating habits. History of falls. Memory and ability to understand (cognition). Work and work Statistician. Reproductive health. Screening  You may have the following tests or measurements: Height, weight, and BMI. Blood pressure. Lipid and cholesterol levels. These may be checked every 5 years, or more frequently if you are over 59 years old. Skin check. Lung cancer screening. You may have this screening every year starting at age 8 if you have a 30-pack-year history of smoking and currently smoke or have quit within the past 15 years. Fecal occult blood test (FOBT) of the stool. You may have this test every year starting at age 83. Flexible sigmoidoscopy or colonoscopy. You may have a sigmoidoscopy every 5 years or a colonoscopy every 10 years starting at age 35. Hepatitis C blood test. Hepatitis B blood test. Sexually transmitted disease (STD) testing. Diabetes screening. This is done by checking your blood sugar (glucose) after you have not eaten for a while (fasting). You may have this done every 1-3 years. Bone density scan. This  is done to screen for osteoporosis. You may have this done starting at age 60. Mammogram. This may be done every 1-2 years. Talk to your health care provider about how often you should have regular mammograms. Talk with your health care provider about your test results, treatment options, and if necessary, the need for more tests. Vaccines  Your health care provider may recommend certain vaccines, such  as: Influenza vaccine. This is recommended every year. Tetanus, diphtheria, and acellular pertussis (Tdap, Td) vaccine. You may need a Td booster every 10 years. Zoster vaccine. You may need this after age 45. Pneumococcal 13-valent conjugate (PCV13) vaccine. One dose is recommended after age 67. Pneumococcal polysaccharide (PPSV23) vaccine. One dose is recommended after age 27. Talk to your health care provider about which screenings and vaccines you need and how often you need them. This information is not intended to replace advice given to you by your health care provider. Make sure you discuss any questions you have with your health care provider. Document Released: 12/11/2015 Document Revised: 08/03/2016 Document Reviewed: 09/15/2015 Elsevier Interactive Patient Education  2017 [employer] Prevention in the Home Falls can cause injuries. They can happen to people of all ages. There are many things you can do to make your home safe and to help prevent falls. What can I do on the outside of my home? Regularly fix the edges of walkways and driveways and fix any cracks. Remove anything that might make you trip as you walk through a door, such as a raised step or threshold. Trim any bushes or trees on the path to your home. Use bright outdoor lighting. Clear any walking paths of anything that might make someone trip, such as rocks or tools. Regularly check to see if handrails are loose or broken. Make sure that both sides of any steps have handrails. Any raised decks and porches should have guardrails on the edges. Have any leaves, snow, or ice cleared regularly. Use sand or salt on walking paths during winter. Clean up any spills in your garage right away. This includes oil or grease spills. What can I do in the bathroom? Use night lights. Install grab bars by the toilet and in the tub and shower. Do not use towel bars as grab bars. Use non-skid mats or decals in the tub or  shower. If you need to sit down in the shower, use a plastic, non-slip stool. Keep the floor dry. Clean up any water that spills on the floor as soon as it happens. Remove soap buildup in the tub or shower regularly. Attach bath mats securely with double-sided non-slip rug tape. Do not have throw rugs and other things on the floor that can make you trip. What can I do in the bedroom? Use night lights. Make sure that you have a light by your bed that is easy to reach. Do not use any sheets or blankets that are too big for your bed. They should not hang down onto the floor. Have a firm chair that has side arms. You can use this for support while you get dressed. Do not have throw rugs and other things on the floor that can make you trip. What can I do in the kitchen? Clean up any spills right away. Avoid walking on wet floors. Keep items that you use a lot in easy-to-reach places. If you need to reach something above you, use a strong step stool that has a grab bar. Keep electrical cords out  of the way. Do not use floor polish or wax that makes floors slippery. If you must use wax, use non-skid floor wax. Do not have throw rugs and other things on the floor that can make you trip. What can I do with my stairs? Do not leave any items on the stairs. Make sure that there are handrails on both sides of the stairs and use them. Fix handrails that are broken or loose. Make sure that handrails are as long as the stairways. Check any carpeting to make sure that it is firmly attached to the stairs. Fix any carpet that is loose or worn. Avoid having throw rugs at the top or bottom of the stairs. If you do have throw rugs, attach them to the floor with carpet tape. Make sure that you have a light switch at the top of the stairs and the bottom of the stairs. If you do not have them, ask someone to add them for you. What else can I do to help prevent falls? Wear shoes that: Do not have high heels. Have  rubber bottoms. Are comfortable and fit you well. Are closed at the toe. Do not wear sandals. If you use a stepladder: Make sure that it is fully opened. Do not climb a closed stepladder. Make sure that both sides of the stepladder are locked into place. Ask someone to hold it for you, if possible. Clearly mark and make sure that you can see: Any grab bars or handrails. First and last steps. Where the edge of each step is. Use tools that help you move around (mobility aids) if they are needed. These include: Canes. Walkers. Scooters. Crutches. Turn on the lights when you go into a dark area. Replace any light bulbs as soon as they burn out. Set up your furniture so you have a clear path. Avoid moving your furniture around. If any of your floors are uneven, fix them. If there are any pets around you, be aware of where they are. Review your medicines with your doctor. Some medicines can make you feel dizzy. This can increase your chance of falling. Ask your doctor what other things that you can do to help prevent falls. This information is not intended to replace advice given to you by your health care provider. Make sure you discuss any questions you have with your health care provider. Document Released: 09/10/2009 Document Revised: 04/21/2016 Document Reviewed: 12/19/2014 Elsevier Interactive Patient Education  2017 [employer].

## 2021-10-15 NOTE — Progress Notes (Signed)
Subjective:   Glenda Francis is a 70 y.o. female who presents for Medicare Annual/Subsequent preventive examination.  Review of Systems    No ROS  Cardiac Risk Factors include: advanced age (>3men, >73 women);hypertension    Objective:    Today's Vitals   10/15/21 1027  BP: 129/80  Weight: 136 lb (61.7 kg)  Height: 4\' 11"  (1.499 m)   Body mass index is 27.47 kg/m.  Advanced Directives 10/15/2021 09/15/2020 03/10/2020 08/20/2015 09/17/2014  Does Patient Have a Medical Advance Directive? Yes Yes No Yes No;Yes  Type of Paramedic of [place];Living will Living will;Healthcare Power of [place];Living will Living will  Does patient want to make changes to medical advance directive? No - Patient declined No - Patient declined No - Patient declined - -  Copy of [place] in Chart? No - copy requested No - copy requested - - -  Would patient like information on creating a medical advance directive? - - No - Patient declined - -    Current Medications (verified) Outpatient Encounter Medications as of 10/15/2021  Medication Sig   cephALEXin (KEFLEX) 500 MG capsule Take 1 capsule (500 mg total) by mouth 4 (four) times daily.   divalproex (DEPAKOTE) 500 MG DR tablet Take 1 tablet by mouth twice daily   levETIRAcetam (KEPPRA) 500 MG tablet Take 2 tablets (1,000 mg total) by mouth every 12 (twelve) hours.   lisinopril-hydrochlorothiazide (ZESTORETIC) 20-12.5 MG tablet Take 1 tablet by mouth daily.   No facility-administered encounter medications on file as of 10/15/2021.    Allergies (verified) Patient has no known allergies.   History: Past Medical History:  Diagnosis Date   Anemia    as a child   Asthma    childhood   Cervicogenic headache 08/20/2015   Left side   Chicken pox    Depression    when son passed away   Family history of colon cancer    Family history of ovarian cancer    Family history of  pancreatic cancer    Family history of stomach cancer    Family history of uterine cancer    Hypertension    Seizures ([place]) 1   started in 07/2008/ last seizure was in 2012   Past Surgical History:  Procedure Laterality Date   ABDOMINAL HYSTERECTOMY  1989   partial, menorrhagia   CATARACT EXTRACTION Bilateral 10/26/2015, 12-04/2015   laser   COLONOSCOPY     Family History  Problem Relation Age of Onset   Cancer Mother        uterine,ovarian   Uterine cancer Mother    Ovarian cancer Mother    Cancer Brother        colon-PASSEDA WAY 35, pancreatic   Colon cancer Brother    Pancreatic cancer Brother    Cancer Brother        colon,stomach,brain   Colon cancer Brother    Other Sister        Pos for Lynch syndrome   Colon cancer Other        died at 58   Asthma Other    Hyperlipidemia Other    Cancer Maternal Aunt        uterine and ovarian   Uterine cancer Maternal Aunt    Ovarian cancer Maternal Aunt    Cancer Maternal Aunt        ovarian and uterine   Uterine cancer Maternal Aunt    Ovarian cancer  Maternal Aunt    Uterine cancer Other        died at 74   Ovarian cancer Other    Colon cancer Cousin        6 maternal cousins with colon cancer   Social History   Socioeconomic History   Marital status: Divorced    Spouse name: Not on file   Number of children: 3   Years of education: college   Highest education level: Not on file  Occupational History   Occupation: retired  Tobacco Use   Smoking status: Former   Smokeless tobacco: Never   Tobacco comments:    only Art gallery manager Use: Never used  Substance and Sexual Activity   Alcohol use: Yes    Alcohol/week: 7.0 standard drinks    Types: 7 Glasses of wine per week    Comment: 1 GLASS WINE WITH DINNER   Drug use: No   Sexual activity: Not Currently  Other Topics Concern   Not on file  Social History Narrative   Patient is divorced   Patient  has 3 children.   Patient is retired    Patient has some college.   Patient is currently doing yoga and water aerobics.   Patient drinks 1-2 cups of caffeine daily.   Patient is right handed.    Social Determinants of Health   Financial Resource Strain: Low Risk    Difficulty of Paying Living Expenses: Not hard at all  Food Insecurity: No Food Insecurity   Worried About Charity fundraiser in the Last Year: Never true   [place] in the Last Year: Never true  Transportation Needs: No Transportation Needs   Lack of Transportation (Medical): No   Lack of Transportation (Non-Medical): No  Physical Activity: Insufficiently Active   Days of Exercise per Week: 5 days   Minutes of Exercise per Session: 20 min  Stress: No Stress Concern Present   Feeling of Stress : Only a little  Social Connections: Socially Isolated   Frequency of Communication with Friends and Family: More than three times a week   Frequency of Social Gatherings with Friends and Family: More than three times a week   Attends Religious Services: Never   Marine scientist or Organizations: No   Attends Archivist Meetings: Never   Marital Status: Divorced    Clinical Intake: Pre-visit preparation completed: YesHow often do you need to have someone help you when you read instructions, pamphlets, or other written materials from your doctor or pharmacy?: 1 - Never  Diabetic? No   Diet: Regular  Activities of Daily Living In your present state of health, do you have any difficulty performing the following activities: 10/15/2021  Hearing? N  Comment Wears hearing aids  Vision? N  Difficulty concentrating or making decisions? N  Walking or climbing stairs? N  Dressing or bathing? N  Doing errands, shopping? N  Comment [place] and eating ? N  Using the Toilet? N  In the past six months, have you accidently leaked urine? N  Do you have problems with loss of bowel control? N  Managing your Medications? N   Managing your Finances? N  Housekeeping or managing your Housekeeping? N  Some recent data might be hidden    Patient Care Team: Eulas Post, MD as PCP - General (Family Medicine)  Indicate any recent Medical Services you may have received from other than  [facility] providers in the past year (date may be approximate).     Assessment:   This is a routine wellness examination for Glenda Francis.  Virtual Visit via Telephone Note  I connected with  Glenda Francis on 10/15/21 at 10:30 AM EST by telephone and verified that I am speaking with the correct person using two identifiers.  Location: Patient: Home  Provider: Office  Persons participating in the virtual visit: patient/Nurse Health Advisor   I discussed the limitations, risks, security and privacy concerns of performing an evaluation and management service by telephone and the availability of in person appointments. The patient expressed understanding and agreed to proceed.  Interactive audio and video telecommunications were attempted between this nurse and patient, however failed, due to patient having technical difficulties OR patient did not have access to video capability.  We continued and completed visit with audio only.  Some vital signs may be absent or patient reported.   Criselda Peaches, LPN   Hearing/Vision screen Hearing Screening - Comments:: Wears hearing aids Vision Screening - Comments:: No difficulty seeing  Dietary issues and exercise activities discussed: Current Exercise Habits: Home exercise routine, Type of exercise: stretching;walking, Time (Minutes): 20, Frequency (Times/Week): 5, Weekly Exercise (Minutes/Week): 100, Intensity: Moderate   Goals Addressed             This Visit's Progress    Exercise 150 min/wk Moderate Activity       Patient would like to continue exercise/walking      Patient Stated       I would like to try to travel to [place] and to my country to see my family.        Depression Screen PHQ 2/9 Scores 10/15/2021 09/15/2020 01/30/2020  PHQ - 2 Score 0 0 4  PHQ- 9 Score - 1 14    Fall Risk Fall Risk  10/15/2021 09/15/2020 09/09/2020  Falls in the past year? 0 0 0  Number falls in past yr: 0 0 0  Injury with Fall? 0 0 0  Risk for fall due to : - No Fall Risks -  Follow up - Falls evaluation completed;Falls prevention discussed -    FALL RISK PREVENTION PERTAINING TO THE HOME:  Any stairs in or around the home? Yes. If so, are there any without handrails? No  Home free of loose throw rugs in walkways, pet beds, electrical cords, etc? Yes Adequate lighting in your home to reduce risk of falls? Yes   ASSISTIVE DEVICES UTILIZED TO PREVENT FALLS:  Life alert? No  Use of a cane, walker or w/c? No  Grab bars in the bathroom? No  Shower chair or bench in shower? Yes  Elevated toilet seat or a handicapped toilet? No   TIMED UP AND GO:  Was the test performed? No . Audio visit  Cognitive Function:    Immunizations Immunization History  Administered Date(s) Administered   Influenza,inj,Quad PF,6+ Mos 08/30/2013   PFIZER(Purple Top)SARS-COV-2 Vaccination 03/07/2020, 04/01/2020   Tdap 08/30/2013    Flu Vaccine status: Declined, Education has been provided regarding the importance of this vaccine but patient still declined. Advised may receive this vaccine at local pharmacy or Health Dept. Aware to provide a copy of the vaccination record if obtained from local pharmacy or Health Dept. Verbalized acceptance and understanding.  Pneumococcal vaccine status: Declined,  Education has been provided regarding the importance of this vaccine but patient still declined. Advised may receive this vaccine at local pharmacy or Health Dept. Aware to provide  a copy of the vaccination record if obtained from local pharmacy or Health Dept. Verbalized acceptance and understanding.   Covid-19 vaccine status: Information provided on how to obtain vaccines.    Qualifies for Shingles Vaccine? Yes   Zostavax completed No   Shingrix Completed?: No.    Education has been provided regarding the importance of this vaccine. Patient has been advised to call insurance company to determine out of pocket expense if they have not yet received this vaccine. Advised may also receive vaccine at local pharmacy or Health Dept. Verbalized acceptance and understanding.  Screening Tests Health Maintenance  Topic Date Due   COVID-19 Vaccine (3 - Booster for Pfizer series) 10/31/2021 (Originally 05/27/2020)   Zoster Vaccines- Shingrix (1 of 2) 01/15/2022 (Originally 11/05/2002)   INFLUENZA VACCINE  02/25/2022 (Originally 06/28/2021)   Pneumonia Vaccine 35+ Years old (1 - PCV) 10/15/2022 (Originally 11/05/2017)   Hepatitis C Screening  10/15/2022 (Originally 11/05/1969)   MAMMOGRAM  09/23/2022   TETANUS/TDAP  08/31/2023   COLONOSCOPY (Pts 45-4yrs Insurance coverage will need to be confirmed)  09/25/2023   DEXA SCAN  Completed   HPV VACCINES  Aged Out    Health Maintenance  There are no preventive care reminders to display for this patient.  Hepatitis C Screening:  qualify; Completed No.Patient deferred.   Vision Screening: Recommended annual ophthalmology exams for early detection of glaucoma and other disorders of the eye. Is the patient up to date with their annual eye exam?  No  Who is the provider or what is the name of the office in which the patient attends annual eye exams? Patient deferred If pt is not established with a provider, would they like to be referred to a provider to establish care? No .   Dental Screening: Recommended annual dental exams for proper oral hygiene  Community Resource Referral / Chronic Care Management:  CRR required this visit?  No   CCM required this visit?  No      Plan:     I have personally reviewed and noted the following in the patient's chart:   Medical and social history Use of alcohol, tobacco or illicit  drugs  Current medications and supplements including opioid prescriptions. Patient is not currently taking opioids Functional ability and status Nutritional status Physical activity Advanced directives List of other physicians Hospitalizations, surgeries, and ER visits in previous 12 months Vitals Screenings to include cognitive, depression, and falls Referrals and appointments  In addition, I have reviewed and discussed with patient certain preventive protocols, quality metrics, and best practice recommendations. A written personalized care plan for preventive services as well as general preventive health recommendations were provided to patient.     Criselda Peaches, LPN   [id number]

## 2021-12-17 ENCOUNTER — Ambulatory Visit (INDEPENDENT_AMBULATORY_CARE_PROVIDER_SITE_OTHER): Payer: Medicare HMO | Admitting: Family Medicine

## 2021-12-17 ENCOUNTER — Encounter: Payer: Self-pay | Admitting: Family Medicine

## 2021-12-17 VITALS — BP 130/80 | HR 91 | Resp 16 | Ht 59.0 in | Wt 136.1 lb

## 2021-12-17 DIAGNOSIS — R300 Dysuria: Secondary | ICD-10-CM

## 2021-12-17 DIAGNOSIS — M545 Low back pain, unspecified: Secondary | ICD-10-CM | POA: Diagnosis not present

## 2021-12-17 DIAGNOSIS — N390 Urinary tract infection, site not specified: Secondary | ICD-10-CM

## 2021-12-17 LAB — POCT URINALYSIS DIPSTICK
Bilirubin, UA: POSITIVE
Blood, UA: POSITIVE
Glucose, UA: NEGATIVE
Ketones, UA: NEGATIVE
Nitrite, UA: POSITIVE
Protein, UA: NEGATIVE
Spec Grav, UA: 1.015 (ref 1.010–1.025)
Urobilinogen, UA: 1 E.U./dL
pH, UA: 7 (ref 5.0–8.0)

## 2021-12-17 MED ORDER — CEPHALEXIN 500 MG PO CAPS: 500.0000 mg | ORAL_CAPSULE | Freq: Three times a day (TID) | ORAL | 0 refills | Status: AC

## 2021-12-17 MED ORDER — NAPROXEN 375 MG PO TBEC: 1.0000 | DELAYED_RELEASE_TABLET | Freq: Two times a day (BID) | ORAL | 0 refills | Status: AC

## 2021-12-17 NOTE — Patient Instructions (Addendum)
A few things to remember from today's visit:  Dysuria - Plan: POCT urinalysis dipstick, Culture, Urine, cephALEXin (KEFLEX) 500 MG capsule, Ambulatory referral to Urology  Acute bilateral low back pain without sciatica - Plan: Naproxen 375 MG TBEC  Recurrent UTI - Plan: Ambulatory referral to Urology  El dolor de espalda parece muscular. Naproxen con comida. Icy hot patch puede ayudar.  Do not use My Chart to request refills or for acute issues that need immediate attention.   Today we will treat empirically with antibiotic, which we might need to change when urine culture comes back depending of bacteria susceptibility.  Seek immediate medical attention if severe abdominal pain, vomiting, fever/chills, or worsening symptoms. F/U if symptomatic are not any better after 2-3 days of antibiotic treatment.  Please be sure medication list is accurate. If a new problem present, please set up appointment sooner than planned today.  Infeccin urinaria en los adultos Urinary Tract Infection, Adult Una infeccin urinaria (IU) puede ocurrir en Clinical cytogeneticist de las vas [place]. Las vas urinarias incluyen a los riones, los urteres, la vejiga y Geologist, engineering. Estos rganos fabrican, Buyer, retail y eliminan la orina del organismo. La IU alta afecta los urteres y los riones. La IU baja afecta la vejiga y Geologist, engineering. Cules son las causas? La mayora de las infecciones de las vas urinarias es causada por la presencia de bacterias en la zona genital, alrededor de la uretra, por donde sale la orina del cuerpo. Estas bacterias proliferan y causan inflamacin en las vas urinarias. Qu incrementa el riesgo? Es ms probable que sufra esta afeccin si: Tiene colocado un catter [employer]. No puede controlar cundo Production assistant, radio (incontinencia). Es mujer y usted: [place] espermicida o diafragma como mtodo anticonceptivo. Tiene niveles bajos de estrgenos. Est embarazada. Tiene ciertos  genes que [employer]. Es sexualmente activa. Toma antibiticos. Tiene una afeccin que causa que el flujo de orina sea [place], como: Prstata agrandada, si usted es hombre. Obstruccin de Geologist, engineering. Clculo renal. Una afeccin nerviosa que afecta el control de la vejiga (vejiga neurgena). No bebe lo suficiente o no orina con frecuencia. Tiene ciertas enfermedades crnicas, como: Diabetes. Un sistema que combate las enfermedades (sistemainmunitario) debilitado. Anemia drepanoctica. Gota. Lesin en la mdula espinal. Cules son los signos o sntomas? Los sntomas de esta afeccin incluyen: Necesidad inmediata (urgencia) de Garment/textile technologist. Miccin frecuente. Esto puede incluir pequeas cantidades de [place] cada vez que [place]. Ardor o dolor al [employer]. Presencia de [employer]. Orina con mal olor u [employer] atpico. Dificultad para Garment/textile technologist. Bennie Hind turbia. Secrecin vaginal, si es mujer. Dolor en el abdomen o en la parte inferior de la espalda. Es posible que tambin tenga: Vmitos o disminucin del apetito. Confusin. Irritabilidad o cansancio. Fiebre o escalofros. Diarrea. El Psychologist, educational sntoma en los adultos mayores puede ser la confusin. En algunos casos, es posible que no tengan sntomas hasta que la infeccin empeore. Cmo se diagnostica? Esta afeccin se diagnostica en funcin de sus antecedentes mdicos y de un examen fsico. Tambin pueden hacerle otras pruebas, incluidas las siguientes: Anlisis de [place]. Anlisis de [place]. Pruebas de infecciones de transmisin sexual (ITS). Si ha tenido ms de una infeccin urinaria (IU), se pueden hacer estudios de diagnstico por imgenes o una cistoscopia para determinar la causa de las infecciones. Cmo se trata? El tratamiento de esta afeccin incluye lo siguiente: Antibiticos. Medicamentos de [place] molestias. Beber una cantidad suficiente agua para mantenerse hidratado. Si tiene infecciones con  frecuencia o tiene otras afecciones, como un clculo renal, es posible que deba ver a un mdico especialista en las vas urinarias (urlogo). En casos poco frecuentes, las infecciones urinarias pueden provocar sepsis. La sepsis es una afeccin potencialmente mortal que se produce cuando el cuerpo responde a una infeccin. La sepsis se trata en el hospital con antibiticos, lquidos y otros medicamentos que se administran por va intravenosa. Siga estas instrucciones en su casa: Medicamentos Use los medicamentos de venta libre y los recetados solamente como se lo haya indicado el mdico. Si le recetaron un antibitico, tmelo como se lo haya indicado el mdico. No deje de usar el antibitico aunque comience a Sports administrator. Instrucciones generales Asegrese de hacer lo siguiente: Vaciar la vejiga con frecuencia y en su totalidad. No contener la orina durante largos perodos. Vaciar la vejiga despus de Adult nurse. Limpiarse de atrs hacia adelante despus de orinar o defecar, si es mujer. Usar cada trozo de papel higinico solo una vez cuando se limpie. Beber suficiente lquido como para Theatre manager la orina de color amarillo plido. Cumpla con todas las visitas de seguimiento. Esto es importante. Comunquese con un mdico si: Los sntomas no mejoran despus de 1 o 2 das de [place]. Los sntomas desaparecen y luego vuelven a Arts administrator. Solicite ayuda de inmediato si: Siente dolor intenso en la espalda o en la parte inferior del abdomen. Tiene fiebre o escalofros. Tiene nuseas o vmitos. Resumen Una infeccin urinaria (IU) es una infeccin en cualquier parte de las vas urinarias, que [employer] riones, los urteres, la vejiga y Geologist, engineering. [place] infecciones de las vas urinarias es causada por bacterias en la zona genital. El tratamiento de esta afeccin suele incluir antibiticos. Si le recetaron un antibitico, tmelo como se lo haya indicado el mdico. No deje de usar el  antibitico aunque comience a Sports administrator. Cumpla con todas las visitas de seguimiento. Esto es importante. Esta informacin no tiene Marine scientist el consejo del mdico. Asegrese de hacerle al mdico cualquier pregunta que tenga. Document Revised: 09/07/2020 Document Reviewed: 09/07/2020 Elsevier Patient Education  2022 [employer].

## 2021-12-17 NOTE — Progress Notes (Signed)
ACUTE VISIT Chief Complaint  Patient presents with   Dysuria   HPI: [name] is a 71 y.o. female, who is here today complaining of 3-4 days of urinary symptoms as described above. She has been treated for UTI a few times in the past 6 months. She has not identified exacerbating or alleviating factors.  Dysuria  This is a recurrent problem. The current episode started in the past 7 days. The problem has been waxing and waning. She is Not sexually active. There is No history of pyelonephritis. Associated symptoms include frequency and urgency. Pertinent negatives include no chills, discharge, flank pain, hematuria, hesitancy, nausea, sweats or vomiting. She has tried increased fluids for the symptoms. The treatment provided no relief. Her past medical history is significant for recurrent UTIs.  Subjective fever. Lower and upper intermittent abdominal pain. Took Azo until this morning. She has increased water intake and drinks cranberry juice. Low back pain, achy like, not radiated; which she thinks "kidney." Intermittent 7/10. Exacerbated by movement.  Cephalexin is the only abx she has tolerated well and has helped. Last Ucx 09/29/21:  Component 2 mo ago  MICRO NUMBER: [id number]   SPECIMEN QUALITY: Adequate   Sample Source NOT GIVEN   STATUS: FINAL   ISOLATE 1: Escherichia coli Abnormal    Comment: Greater than 100,000 CFU/mL of Escherichia coli   Review of Systems  Constitutional:  Negative for activity change, appetite change, chills and unexpected weight change.  Gastrointestinal:  Negative for blood in stool, nausea and vomiting.       Negative for changes in bowel habits.  Genitourinary:  Positive for dysuria, frequency and urgency. Negative for decreased urine volume, flank pain, hematuria, hesitancy, vaginal bleeding and vaginal discharge.  Skin:  Negative for pallor and rash.  Rest see pertinent positives and negatives per HPI.  Current Outpatient Medications on  File Prior to Visit  Medication Sig Dispense Refill   divalproex (DEPAKOTE) 500 MG DR tablet Take 1 tablet by mouth twice daily 180 tablet 4   levETIRAcetam (KEPPRA) 500 MG tablet Take 2 tablets (1,000 mg total) by mouth every 12 (twelve) hours. 360 tablet 4   lisinopril-hydrochlorothiazide (ZESTORETIC) 20-12.5 MG tablet Take 1 tablet by mouth daily. 90 tablet 3   No current facility-administered medications on file prior to visit.   Past Medical History:  Diagnosis Date   Anemia    as a child   Asthma    childhood   Cervicogenic headache 08/20/2015   Left side   Chicken pox    Depression    when son passed away   Family history of colon cancer    Family history of ovarian cancer    Family history of pancreatic cancer    Family history of stomach cancer    Family history of uterine cancer    Hypertension    Seizures ([place]) 1   started in 07/2008/ last seizure was in 2012   Allergies  Allergen Reactions   Sulfamethoxazole-Trimethoprim Nausea Only    Social History   Socioeconomic History   Marital status: Divorced    Spouse name: Not on file   Number of children: 3   Years of education: college   Highest education level: Not on file  Occupational History   Occupation: retired  Tobacco Use   Smoking status: Former   Smokeless tobacco: Never   Tobacco comments:    only Art gallery manager Use: Never used  Substance and Sexual Activity  Alcohol use: Yes    Alcohol/week: 7.0 standard drinks    Types: 7 Glasses of wine per week    Comment: 1 GLASS WINE WITH DINNER   Drug use: No   Sexual activity: Not Currently  Other Topics Concern   Not on file  Social History Narrative   Patient is divorced   Patient  has 3 children.   Patient is retired   Patient has some college.   Patient is currently doing yoga and water aerobics.   Patient drinks 1-2 cups of caffeine daily.   Patient is right handed.    Social Determinants of Health   Financial Resource  Strain: Low Risk    Difficulty of Paying Living Expenses: Not hard at all  Food Insecurity: No Food Insecurity   Worried About Charity fundraiser in the Last Year: Never true   [place] in the Last Year: Never true  Transportation Needs: No Transportation Needs   Lack of Transportation (Medical): No   Lack of Transportation (Non-Medical): No  Physical Activity: Insufficiently Active   Days of Exercise per Week: 5 days   Minutes of Exercise per Session: 20 min  Stress: No Stress Concern Present   Feeling of Stress : Only a little  Social Connections: Socially Isolated   Frequency of Communication with Friends and Family: More than three times a week   Frequency of Social Gatherings with Friends and Family: More than three times a week   Attends Religious Services: Never   Marine scientist or Organizations: No   Attends Archivist Meetings: Never   Marital Status: Divorced    Vitals:   12/17/21 1419  BP: 130/80  Pulse: 91  Resp: 16  SpO2: 97%   Body mass index is 27.49 kg/m.  Physical Exam Vitals and nursing note reviewed.  Constitutional:      General: She is not in acute distress.    Appearance: She is well-developed.  HENT:     Head: Normocephalic and atraumatic.     Mouth/Throat:     Mouth: Mucous membranes are moist.     Pharynx: Oropharynx is clear.  Eyes:     Conjunctiva/sclera: Conjunctivae normal.  Cardiovascular:     Rate and Rhythm: Normal rate and regular rhythm.  Pulmonary:     Effort: Pulmonary effort is normal. No respiratory distress.     Breath sounds: Normal breath sounds.  Abdominal:     Palpations: Abdomen is soft. There is no mass.     Tenderness: There is abdominal tenderness (Mild) in the suprapubic area. There is no guarding or rebound.  Musculoskeletal:     Lumbar back: Spasms and tenderness present. No bony tenderness. Negative right straight leg raise test and negative left straight leg raise test.        Back:  Lymphadenopathy:     Cervical: No cervical adenopathy.  Skin:    General: Skin is warm.     Findings: No erythema.  Neurological:     Mental Status: She is alert and oriented to person, place, and time.     Gait: Gait normal.  Psychiatric:        Mood and Affect: Mood is anxious.   ASSESSMENT AND PLAN:  Ms.Yennifer was seen today for dysuria.  Diagnoses and all orders for this visit: Orders Placed This Encounter  Procedures   Culture, Urine   Ambulatory referral to Urology   POCT urinalysis dipstick   Dysuria Recurrent. We discussed  differential Dx's. Urine dipstick with several abnormalities. Ucx sent.  Acute bilateral low back pain without sciatica Seems to be musculoskeletal. We discussed some side effects of NSAID's, recommend 7-10 days treatment and to take med with food. Local icy hot patch may also help.  -     Naproxen 375 MG TBEC; Take 1 tablet (375 mg total) by mouth in the morning and at bedtime for 7 days.  Recurrent UTI Empiric abx treatment started today, she would like Cephalexin, which she has tolerated better in the past. We will tailor treatment according to Ucx results and susceptibility report.  Clearly instructed about warning signs. F/U if symptoms persist. Urology referral placed.  -     cephALEXin (KEFLEX) 500 MG capsule; Take 1 capsule (500 mg total) by mouth 3 (three) times daily for 7 days.  Return if symptoms worsen or fail to improve.  Merridy Pascoe G. [place], MD  [facility]. [place] office.

## 2021-12-18 LAB — URINE CULTURE
MICRO NUMBER:: 12898597
SPECIMEN QUALITY:: ADEQUATE

## 2021-12-19 ENCOUNTER — Encounter: Payer: Self-pay | Admitting: Family Medicine

## 2022-03-23 ENCOUNTER — Other Ambulatory Visit: Payer: Self-pay

## 2022-03-23 MED ORDER — LISINOPRIL-HYDROCHLOROTHIAZIDE 20-12.5 MG PO TABS: 1.0000 | ORAL_TABLET | Freq: Every day | ORAL | 0 refills | Status: DC

## 2022-05-09 NOTE — Progress Notes (Signed)
PATIENT: Glenda Francis DOB: 1950/12/18  REASON FOR VISIT: follow up for seizures HISTORY FROM: patient, son  PRIMARY NEUROLOGIST: Dr. April Manson  HISTORY OF PRESENT ILLNESS: Today 05/10/22 71 year old Glenda Francis is here today for follow-up.  On Keppra and Depakote.  At last visit in June 2022, CBC was normal.  Keppra, Depakote, CMP did not result. Last seizure was in 2012, none since Depakote was added. Lives alone, drives a car. She takes Vitamin D.   Update 05/10/2021 SS: [name] is a 71 year old female with history of well-controlled seizures on Keppra and Depakote. No seizures since last seen.  Denies medication side effect.  Lives alone, drives a car.  Denies any changes to her health.  Is on medication for her blood pressure, careful to stand slowly.  Here today accompanied by her son.  Update 09/17/2019 SS: [name] is a 71 year old female with history of well-controlled seizures on Keppra and Depakote.  She continues to tolerate her medications well.  She has not had recurrent seizure in nearly 10 years.  She lives alone, and she is able to operate a motor vehicle.  She is able to perform all of her own ADLs.  She has routine care with her OB/GYN and primary doctor.  She denies any trouble ambulating or any falls.  She denies any new problems or concerns.  She presents today for follow-up unaccompanied.  HISTORY 03/18/2019 Dr. Jannifer Franklin: Glenda Francis is a 71 year old right-handed Hispanic female with a history of well-controlled seizures on Keppra and Depakote.  She tolerates his medications well, she has not had any seizures since last seen.  She is able to operate a motor vehicle.  She did have routine blood work done in February 2020.  She reports that she tries to exercise on a regular basis, she is able to sleep well at night.  No other new medical issues have come up since last seen.   REVIEW OF SYSTEMS: Out of a complete 14 system review of symptoms, the patient complains only of the following  symptoms, and all other reviewed systems are negative.  See HPI  ALLERGIES: Allergies  Allergen Reactions   Sulfamethoxazole-Trimethoprim Nausea Only    HOME MEDICATIONS: Outpatient Medications Prior to Visit  Medication Sig Dispense Refill   divalproex (DEPAKOTE) 500 MG DR tablet Take 1 tablet by mouth twice daily 180 tablet 4   levETIRAcetam (KEPPRA) 500 MG tablet Take 2 tablets (1,000 mg total) by mouth every 12 (twelve) hours. 360 tablet 4   lisinopril-hydrochlorothiazide (ZESTORETIC) 20-12.5 MG tablet Take 1 tablet by mouth daily. 100 tablet 0   No facility-administered medications prior to visit.    PAST MEDICAL HISTORY: Past Medical History:  Diagnosis Date   Anemia    as a child   Asthma    childhood   Cervicogenic headache 08/20/2015   Left side   Chicken pox    Depression    when son passed away   Family history of colon cancer    Family history of ovarian cancer    Family history of pancreatic cancer    Family history of stomach cancer    Family history of uterine cancer    Hypertension    Seizures ([place]) 1   started in 07/2008/ last seizure was in 2012    PAST SURGICAL HISTORY: Past Surgical History:  Procedure Laterality Date   ABDOMINAL HYSTERECTOMY  1989   partial, menorrhagia   CATARACT EXTRACTION Bilateral 10/26/2015, 12-04/2015   laser   COLONOSCOPY  FAMILY HISTORY: Family History  Problem Relation Age of Onset   Cancer Mother        uterine,ovarian   Uterine cancer Mother    Ovarian cancer Mother    Other Sister        Pos for Lynch syndrome   Cancer Brother        colon-PASSEDA WAY 10, pancreatic   Colon cancer Brother    Pancreatic cancer Brother    Cancer Brother        colon,stomach,brain   Colon cancer Brother    Cancer Maternal Aunt        uterine and ovarian   Uterine cancer Maternal Aunt    Ovarian cancer Maternal Aunt    Cancer Maternal Aunt        ovarian and uterine   Uterine cancer Maternal Aunt    Ovarian  cancer Maternal Aunt    Colon cancer Cousin        6 maternal cousins with colon cancer   Colon cancer Other        died at 63   Asthma Other    Hyperlipidemia Other    Uterine cancer Other        died at 33   Ovarian cancer Other    Seizures Neg Hx     SOCIAL HISTORY: Social History   Socioeconomic History   Marital status: Divorced    Spouse name: Not on file   Number of children: 3   Years of education: college   Highest education level: Not on file  Occupational History   Occupation: retired  Tobacco Use   Smoking status: Former   Smokeless tobacco: Never   Tobacco comments:    only Art gallery manager Use: Never used  Substance and Sexual Activity   Alcohol use: Not Currently    Comment: socially   Drug use: No   Sexual activity: Not Currently  Other Topics Concern   Not on file  Social History Narrative   Patient is divorced   Patient  has 3 children.   Patient is retired   Patient has some college.   Patient is currently doing yoga and water aerobics.   Patient drinks 1-2 cups of caffeine daily.   Patient is right handed.    Social Determinants of Health   Financial Resource Strain: Low Risk  (10/15/2021)   Overall Financial Resource Strain (CARDIA)    Difficulty of Paying Living Expenses: Not hard at all  Food Insecurity: No Food Insecurity (10/15/2021)   Hunger Vital Sign    Worried About Running Out of Food in the Last Year: Never true    Ran Out of Food in the Last Year: Never true  Transportation Needs: No Transportation Needs (10/15/2021)   PRAPARE - Hydrologist (Medical): No    Lack of Transportation (Non-Medical): No  Physical Activity: Insufficiently Active (10/15/2021)   Exercise Vital Sign    Days of Exercise per Week: 5 days    Minutes of Exercise per Session: 20 min  Stress: No Stress Concern Present (10/15/2021)   [place]     Feeling of Stress : Only a little  Social Connections: Socially Isolated (10/15/2021)   Social Connection and Isolation Panel [NHANES]    Frequency of Communication with Friends and Family: More than three times a week    Frequency of Social Gatherings with Friends and Family: More than three  times a week    Attends Religious Services: Never    Active Member of Clubs or Organizations: No    Attends Archivist Meetings: Never    Marital Status: Divorced  Human resources officer Violence: Not At Risk (10/15/2021)   Humiliation, Afraid, Rape, and Kick questionnaire    Fear of Current or Ex-Partner: No    Emotionally Abused: No    Physically Abused: No    Sexually Abused: No   PHYSICAL EXAM  Vitals:   05/10/22 1339  BP: (!) 148/75  Pulse: 74  Weight: 135 lb 3.2 oz (61.3 kg)  Height: '4\' 10"'$  (1.473 m)   Body mass index is 28.26 kg/m.  Generalized: Well developed, in no acute distress   Neurological examination  Mentation: Alert oriented to time, place, history taking. Follows all commands speech and language fluent, very pleasant Cranial nerve II-XII: Pupils were equal round reactive to light. Extraocular movements were full, visual field were full on confrontational test. Facial sensation and strength were normal. Head turning and shoulder shrug  were normal and symmetric. Motor: The motor testing reveals 5 over 5 strength of all 4 extremities. Good symmetric motor tone is noted throughout.  Sensory: Sensory testing is intact to soft touch on all 4 extremities. No evidence of extinction is noted.  Coordination: Cerebellar testing reveals good finger-nose-finger and heel-to-shin bilaterally.  Gait and station: Gait is normal. Tandem gait is normal.  Reflexes: Deep tendon reflexes are symmetric and normal bilaterally.   DIAGNOSTIC DATA (LABS, IMAGING, TESTING) - I reviewed patient records, labs, notes, testing and imaging myself where available.  Lab Results  Component Value  Date   WBC 5.6 05/10/2021   HGB 13.5 05/10/2021   HCT 39.3 05/10/2021   MCV 89 05/10/2021   PLT 289 05/10/2021      Component Value Date/Time   NA 138 01/12/2021 0841   NA 139 09/17/2019 1432   K 4.1 01/12/2021 0841   CL 98 01/12/2021 0841   CO2 33 (H) 01/12/2021 0841   GLUCOSE 83 01/12/2021 0841   BUN 12 01/12/2021 0841   BUN 9 09/17/2019 1432   CREATININE 0.66 01/12/2021 0841   CALCIUM 9.6 01/12/2021 0841   PROT 6.8 03/10/2020 0302   PROT 7.1 09/17/2019 1432   ALBUMIN 3.5 03/10/2020 0302   ALBUMIN 4.1 09/17/2019 1432   AST 22 03/10/2020 0302   ALT 17 03/10/2020 0302   ALKPHOS 48 03/10/2020 0302   BILITOT 0.5 03/10/2020 0302   BILITOT 0.2 09/17/2019 1432   GFRNONAA >60 03/10/2020 0302   GFRAA >60 03/10/2020 0302   Lab Results  Component Value Date   CHOL 217 (H) 01/29/2020   HDL 52.90 01/29/2020   LDLCALC 124 (H) 01/29/2020   TRIG 200.0 (H) 01/29/2020   CHOLHDL 4 01/29/2020   Lab Results  Component Value Date   HGBA1C 6.2 01/12/2021   No results found for: "VITAMINB12" Lab Results  Component Value Date   TSH 0.329 (L) 07/16/2010   ASSESSMENT AND PLAN 71 y.o. year old female   1.  History of seizures well-controlled  -Doing well, last seizure was in 2012 when Depakote was added, for now continue Keppra and Depakote -Check labs today -In the past, EEG has shown evidence of left brain sharp transients -Follow-up in 1 year or sooner if needed, she will see Dr .April Manson to establish care   Butler Denmark, AGNP-C, DNP 05/10/2022, 2:09 PM [facility] [address] [place], [place] [address] [phone]

## 2022-05-10 ENCOUNTER — Ambulatory Visit: Payer: Medicare HMO | Admitting: Neurology

## 2022-05-10 ENCOUNTER — Encounter: Payer: Self-pay | Admitting: Neurology

## 2022-05-10 VITALS — BP 148/75 | HR 74 | Ht <= 58 in | Wt 135.2 lb

## 2022-05-10 DIAGNOSIS — G40409 Other generalized epilepsy and epileptic syndromes, not intractable, without status epilepticus: Secondary | ICD-10-CM | POA: Diagnosis not present

## 2022-05-10 MED ORDER — DIVALPROEX SODIUM 500 MG PO DR TAB
DELAYED_RELEASE_TABLET | ORAL | 4 refills | Status: DC
Start: 2022-05-10 — End: 2023-07-26

## 2022-05-10 MED ORDER — LEVETIRACETAM 500 MG PO TABS: 1000.0000 mg | ORAL_TABLET | Freq: Two times a day (BID) | ORAL | 4 refills | Status: DC

## 2022-05-10 NOTE — Patient Instructions (Signed)
Check labs today  Continue current medications Return back in 1 year, will let you see Dr. April Manson

## 2022-05-11 LAB — CBC WITH DIFFERENTIAL/PLATELET
Basophils Absolute: 0 10*3/uL (ref 0.0–0.2)
Basos: 1 %
EOS (ABSOLUTE): 0.1 10*3/uL (ref 0.0–0.4)
Eos: 3 %
Hematocrit: 38.1 % (ref 34.0–46.6)
Hemoglobin: 13 g/dL (ref 11.1–15.9)
Immature Grans (Abs): 0 10*3/uL (ref 0.0–0.1)
Immature Granulocytes: 0 %
Lymphocytes Absolute: 2.4 10*3/uL (ref 0.7–3.1)
Lymphs: 51 %
MCH: 30 pg (ref 26.6–33.0)
MCHC: 34.1 g/dL (ref 31.5–35.7)
MCV: 88 fL (ref 79–97)
Monocytes Absolute: 0.6 10*3/uL (ref 0.1–0.9)
Monocytes: 12 %
Neutrophils Absolute: 1.6 10*3/uL (ref 1.4–7.0)
Neutrophils: 33 %
Platelets: 245 10*3/uL (ref 150–450)
RBC: 4.34 x10E6/uL (ref 3.77–5.28)
RDW: 12.3 % (ref 11.7–15.4)
WBC: 4.8 10*3/uL (ref 3.4–10.8)

## 2022-05-11 LAB — COMPREHENSIVE METABOLIC PANEL
ALT: 10 IU/L (ref 0–32)
AST: 15 IU/L (ref 0–40)
Albumin/Globulin Ratio: 1.6 (ref 1.2–2.2)
Albumin: 4.2 g/dL (ref 3.8–4.8)
Alkaline Phosphatase: 60 IU/L (ref 44–121)
BUN/Creatinine Ratio: 18 (ref 12–28)
BUN: 9 mg/dL (ref 8–27)
Bilirubin Total: <0.2 mg/dL (ref 0.0–1.2)
CO2: 30 mmol/L — ABNORMAL HIGH (ref 20–29)
Calcium: 9.8 mg/dL (ref 8.7–10.3)
Chloride: 89 mmol/L — ABNORMAL LOW (ref 96–106)
Creatinine, Ser: 0.51 mg/dL — ABNORMAL LOW (ref 0.57–1.00)
Globulin, Total: 2.6 g/dL (ref 1.5–4.5)
Glucose: 156 mg/dL — ABNORMAL HIGH (ref 70–99)
Potassium: 3.9 mmol/L (ref 3.5–5.2)
Sodium: 131 mmol/L — ABNORMAL LOW (ref 134–144)
Total Protein: 6.8 g/dL (ref 6.0–8.5)
eGFR: 101 mL/min/{1.73_m2} (ref >59)

## 2022-05-11 LAB — LEVETIRACETAM LEVEL: Levetiracetam Lvl: 40.5 ug/mL — ABNORMAL HIGH (ref 10.0–40.0)

## 2022-05-11 LAB — VALPROIC ACID LEVEL: Valproic Acid Lvl: 83 ug/mL (ref 50–100)

## 2022-06-28 ENCOUNTER — Ambulatory Visit (INDEPENDENT_AMBULATORY_CARE_PROVIDER_SITE_OTHER): Payer: Medicare HMO | Admitting: Family Medicine

## 2022-06-28 ENCOUNTER — Encounter: Payer: Self-pay | Admitting: Family Medicine

## 2022-06-28 VITALS — BP 160/76 | HR 77 | Temp 97.9°F | Ht <= 58 in | Wt 135.3 lb

## 2022-06-28 DIAGNOSIS — I10 Essential (primary) hypertension: Secondary | ICD-10-CM

## 2022-06-28 DIAGNOSIS — J069 Acute upper respiratory infection, unspecified: Secondary | ICD-10-CM

## 2022-06-28 NOTE — Progress Notes (Signed)
Established Patient Office Visit  Subjective   Patient ID: Laurana Magistro, female    DOB: [birth year]  Age: 71 y.o. MRN: [id number]  Chief Complaint  Patient presents with   Nasal Congestion    Patient complains of nasal congestion, x1 month    Cough    Patient complains of cough, x1 month, Productive cough with clear sputum     HPI   Ms. Termine is seen with some nasal congestion and cough for almost a month.  Denies any active GERD symptoms.  Clear mucus both nasally and with coughing up.  No hemoptysis.  No appetite or weight changes.  No dyspnea.  No fever.  She does take lisinopril HCTZ for hypertension but did not notice any cough when she first went on lisinopril.  Denies any facial pain.  No purulent secretions.  Not aware of any specific allergies this time of year.  She has hypertension treated with lisinopril HCTZ.  Not recently monitoring blood pressures at home.  Past Medical History:  Diagnosis Date   Anemia    as a child   Asthma    childhood   Cervicogenic headache 08/20/2015   Left side   Chicken pox    Depression    when son passed away   Family history of colon cancer    Family history of ovarian cancer    Family history of pancreatic cancer    Family history of stomach cancer    Family history of uterine cancer    Hypertension    Seizures ([place]) 1   started in 07/2008/ last seizure was in 2012   Past Surgical History:  Procedure Laterality Date   ABDOMINAL HYSTERECTOMY  1989   partial, menorrhagia   CATARACT EXTRACTION Bilateral 10/26/2015, 12-04/2015   laser   COLONOSCOPY      reports that she has quit smoking. She has never used smokeless tobacco. She reports that she does not currently use alcohol. She reports that she does not use drugs. family history includes Asthma in an other family member; Cancer in her brother, brother, maternal aunt, maternal aunt, and mother; Colon cancer in her brother, brother, cousin, and another family member;  Hyperlipidemia in an other family member; Other in her sister; Ovarian cancer in her maternal aunt, maternal aunt, mother, and another family member; Pancreatic cancer in her brother; Uterine cancer in her maternal aunt, maternal aunt, mother, and another family member. Allergies  Allergen Reactions   Sulfamethoxazole-Trimethoprim Nausea Only    Review of Systems  Constitutional:  Negative for chills, fever and weight loss.  HENT:  Positive for congestion. Negative for nosebleeds.   Respiratory:  Positive for cough.   Cardiovascular:  Negative for chest pain.  Gastrointestinal:  Negative for abdominal pain.      Objective:     BP (!) 160/76 (BP Location: Left Arm, Patient Position: Sitting, Cuff Size: Normal)   Pulse 77   Temp 97.9 F (36.6 C) (Oral)   Ht '4\' 10"'$  (1.473 m)   Wt 135 lb 4.8 oz (61.4 kg)   SpO2 97%   BMI 28.28 kg/m  BP Readings from Last 3 Encounters:  06/28/22 (!) 160/76  05/10/22 (!) 148/75  12/17/21 130/80   Wt Readings from Last 3 Encounters:  06/28/22 135 lb 4.8 oz (61.4 kg)  05/10/22 135 lb 3.2 oz (61.3 kg)  12/17/21 136 lb 2 oz (61.7 kg)      Physical Exam Vitals reviewed.  Constitutional:      General: She  is not in acute distress.    Appearance: Normal appearance. She is not ill-appearing.  HENT:     Right Ear: Tympanic membrane normal.     Left Ear: Tympanic membrane normal.     Mouth/Throat:     Mouth: Mucous membranes are moist.     Pharynx: Oropharynx is clear.  Cardiovascular:     Rate and Rhythm: Normal rate and regular rhythm.  Pulmonary:     Effort: Pulmonary effort is normal.     Breath sounds: Normal breath sounds. No wheezing or rales.  Musculoskeletal:     Cervical back: Neck supple.     Right lower leg: No edema.     Left lower leg: No edema.  Lymphadenopathy:     Cervical: No cervical adenopathy.  Neurological:     Mental Status: She is alert.      No results found for any visits on 06/28/22.    The 10-year  ASCVD risk score (Arnett DK, et al., 2019) is: 17.8%    Assessment & Plan:   #1 cough and nasal congestion.  Nonfocal exam.  Query allergies given duration of symptoms.  We did discuss other potential triggers such as GERD but she denies any obvious GERD symptoms.  Suggested trial of antihistamine and follow-up 2 weeks to reassess.  If not improved at that point consider chest x-ray and further evaluation.  #2 hypertension.  Poorly controlled today.  Repeat after rest 162/90.  Patient currently on lisinopril HCTZ.  Recheck 2 weeks.  Watch sodium intake.  If not improved at follow-up consider change of medication.  We have also asked that she check several home readings in the meantime and bring her cuff to compare with ours at follow-up.  Return in about 2 weeks (around 07/12/2022).    Carolann Littler, MD

## 2022-07-01 ENCOUNTER — Other Ambulatory Visit: Payer: Self-pay | Admitting: Family Medicine

## 2022-07-18 ENCOUNTER — Ambulatory Visit (INDEPENDENT_AMBULATORY_CARE_PROVIDER_SITE_OTHER): Payer: Medicare HMO | Admitting: Family Medicine

## 2022-07-18 ENCOUNTER — Encounter: Payer: Self-pay | Admitting: Family Medicine

## 2022-07-18 VITALS — BP 160/78 | HR 82 | Temp 98.0°F | Ht <= 58 in | Wt 133.1 lb

## 2022-07-18 DIAGNOSIS — I10 Essential (primary) hypertension: Secondary | ICD-10-CM

## 2022-07-18 MED ORDER — AMLODIPINE BESYLATE 2.5 MG PO TABS: 2.5000 mg | ORAL_TABLET | Freq: Every day | ORAL | 3 refills | Status: DC

## 2022-07-18 NOTE — Progress Notes (Signed)
Established Patient Office Visit  Subjective   Patient ID: Glenda Francis, female    DOB: Oct 15, 1951  Age: 71 y.o. MRN: [id number]  Chief Complaint  Patient presents with   Follow-up    HPI   Here for follow-up hypertension.  Refer to last note for details.  Her cough is some better.  No fever.  Brings in a log of home blood pressures.  She has had several decent control of blood pressures but also several 275 up to 170 systolic.  Takes her HCTZ lisinopril combination pill regularly.  No alcohol use.  Non-smoker.  No headaches or dizziness.  Tries to watch her sodium intake.  Past Medical History:  Diagnosis Date   Anemia    as a child   Asthma    childhood   Cervicogenic headache 08/20/2015   Left side   Chicken pox    Depression    when son passed away   Family history of colon cancer    Family history of ovarian cancer    Family history of pancreatic cancer    Family history of stomach cancer    Family history of uterine cancer    Hypertension    Seizures ([place]) 1   started in 07/2008/ last seizure was in 2012   Past Surgical History:  Procedure Laterality Date   ABDOMINAL HYSTERECTOMY  1989   partial, menorrhagia   CATARACT EXTRACTION Bilateral 10/26/2015, 12-04/2015   laser   COLONOSCOPY      reports that she has quit smoking. She has never used smokeless tobacco. She reports that she does not currently use alcohol. She reports that she does not use drugs. family history includes Asthma in an other family member; Cancer in her brother, brother, maternal aunt, maternal aunt, and mother; Colon cancer in her brother, brother, cousin, and another family member; Hyperlipidemia in an other family member; Other in her sister; Ovarian cancer in her maternal aunt, maternal aunt, mother, and another family member; Pancreatic cancer in her brother; Uterine cancer in her maternal aunt, maternal aunt, mother, and another family member. Allergies  Allergen Reactions    Sulfamethoxazole-Trimethoprim Nausea Only    Review of Systems  Constitutional:  Negative for malaise/fatigue.  Eyes:  Negative for blurred vision.  Respiratory:  Negative for shortness of breath.   Cardiovascular:  Negative for chest pain.  Neurological:  Negative for dizziness, weakness and headaches.      Objective:     BP (!) 160/78 (BP Location: Left Arm, Cuff Size: Normal)   Pulse 82   Temp 98 F (36.7 C) (Oral)   Ht '4\' 10"'$  (1.473 m)   Wt 133 lb 1.6 oz (60.4 kg)   SpO2 96%   BMI 27.82 kg/m  BP Readings from Last 3 Encounters:  07/18/22 (!) 160/78  06/28/22 (!) 160/76  05/10/22 (!) 148/75   Wt Readings from Last 3 Encounters:  07/18/22 133 lb 1.6 oz (60.4 kg)  06/28/22 135 lb 4.8 oz (61.4 kg)  05/10/22 135 lb 3.2 oz (61.3 kg)      Physical Exam Constitutional:      Appearance: She is well-developed.  Eyes:     Pupils: Pupils are equal, round, and reactive to light.  Neck:     Thyroid: No thyromegaly.     Vascular: No JVD.  Cardiovascular:     Rate and Rhythm: Normal rate and regular rhythm.     Heart sounds:     No gallop.  Pulmonary:  Effort: Pulmonary effort is normal. No respiratory distress.     Breath sounds: Normal breath sounds. No wheezing or rales.  Musculoskeletal:     Cervical back: Neck supple.     Right lower leg: No edema.     Left lower leg: No edema.  Neurological:     Mental Status: She is alert.      No results found for any visits on 07/18/22.    The 10-year ASCVD risk score (Arnett DK, et al., 2019) is: 17.8%    Assessment & Plan:   Hypertension suboptimally controlled on lisinopril HCTZ.  -Continue nonpharmacologic factors with exercise, weight control, sodium reduction -Add amlodipine 2.5 mg daily -Continue close home monitoring of blood pressure -Set up 87-monthfollow-up  Return in about 2 months (around 09/17/2022).    BCarolann Littler MD

## 2022-09-22 DIAGNOSIS — Z1231 Encounter for screening mammogram for malignant neoplasm of breast: Secondary | ICD-10-CM | POA: Diagnosis not present

## 2022-09-22 DIAGNOSIS — R319 Hematuria, unspecified: Secondary | ICD-10-CM | POA: Diagnosis not present

## 2022-09-22 DIAGNOSIS — Z01419 Encounter for gynecological examination (general) (routine) without abnormal findings: Secondary | ICD-10-CM | POA: Diagnosis not present

## 2022-09-22 DIAGNOSIS — Z6828 Body mass index (BMI) 28.0-28.9, adult: Secondary | ICD-10-CM | POA: Diagnosis not present

## 2022-09-22 DIAGNOSIS — M8588 Other specified disorders of bone density and structure, other site: Secondary | ICD-10-CM | POA: Diagnosis not present

## 2022-09-30 DIAGNOSIS — H5213 Myopia, bilateral: Secondary | ICD-10-CM | POA: Diagnosis not present

## 2022-10-10 ENCOUNTER — Other Ambulatory Visit: Payer: Self-pay | Admitting: Family Medicine

## 2022-10-10 ENCOUNTER — Other Ambulatory Visit: Payer: Self-pay

## 2022-10-10 DIAGNOSIS — I10 Essential (primary) hypertension: Secondary | ICD-10-CM

## 2022-10-10 MED ORDER — LISINOPRIL-HYDROCHLOROTHIAZIDE 20-12.5 MG PO TABS: ORAL_TABLET | ORAL | 0 refills | Status: DC

## 2022-10-12 ENCOUNTER — Encounter: Payer: Self-pay | Admitting: Family Medicine

## 2022-10-12 ENCOUNTER — Ambulatory Visit: Payer: Medicare HMO | Admitting: Family Medicine

## 2022-10-12 ENCOUNTER — Ambulatory Visit (INDEPENDENT_AMBULATORY_CARE_PROVIDER_SITE_OTHER): Payer: Medicare HMO | Admitting: Family Medicine

## 2022-10-12 VITALS — BP 158/90 | HR 80 | Temp 98.3°F | Ht <= 58 in | Wt 132.7 lb

## 2022-10-12 DIAGNOSIS — R350 Frequency of micturition: Secondary | ICD-10-CM

## 2022-10-12 DIAGNOSIS — I10 Essential (primary) hypertension: Secondary | ICD-10-CM

## 2022-10-12 DIAGNOSIS — M549 Dorsalgia, unspecified: Secondary | ICD-10-CM | POA: Diagnosis not present

## 2022-10-12 LAB — POC URINALSYSI DIPSTICK (AUTOMATED)
Bilirubin, UA: NEGATIVE
Glucose, UA: NEGATIVE
Ketones, UA: NEGATIVE
Leukocytes, UA: NEGATIVE
Nitrite, UA: NEGATIVE
Protein, UA: NEGATIVE
Spec Grav, UA: <=1.005 — AB (ref 1.010–1.025)
Urobilinogen, UA: 0.2 E.U./dL
pH, UA: 7 (ref 5.0–8.0)

## 2022-10-12 MED ORDER — AMLODIPINE BESYLATE 5 MG PO TABS: 5.0000 mg | ORAL_TABLET | Freq: Every day | ORAL | 3 refills | Status: DC

## 2022-10-12 MED ORDER — CEPHALEXIN 500 MG PO CAPS: 500.0000 mg | ORAL_CAPSULE | Freq: Four times a day (QID) | ORAL | 0 refills | Status: DC

## 2022-10-12 NOTE — Progress Notes (Signed)
Established Patient Office Visit  Subjective   Patient ID: Glenda Francis, female    DOB: 03-Apr-1951  Age: 71 y.o. MRN: [id number]  Chief Complaint  Patient presents with   Back Pain    X3 days   Urinary Frequency    X3 days, tried natural garlic supplements, states she was seen by her GYN 3 weeks ago, diagnosed with a UTI and given a prescription for Macrobid '100mg'$  BID x1 week    HPI   Glenda Francis is seen with bilateral back pain around her kidney region for the past several days.  She states she started having some mild burning with urination this past Saturday.  She also relates that she saw her GYN about 2-1/2 to 3 weeks ago and was prescribed Macrobid and symptoms did improve. Not clear if cx was done then.  She denies any current fever but has had some occasional chills.  No gross hematuria.  No nausea or vomiting.  She states she has had exactly the same symptoms previously with UTIs.  She has hypertension.  Currently on amlodipine 2.5 mg daily and lisinopril HCTZ 20/12.5 mg 1 daily.  She had increased stress recently with both her children currently going through divorce.  Denies any headaches.  No chest pains.  No edema.  Trying to watch sodium intake regularly.  No regular alcohol.  Past Medical History:  Diagnosis Date   Anemia    as a child   Asthma    childhood   Cervicogenic headache 08/20/2015   Left side   Chicken pox    Depression    when son passed away   Family history of colon cancer    Family history of ovarian cancer    Family history of pancreatic cancer    Family history of stomach cancer    Family history of uterine cancer    Hypertension    Seizures ([place]) 1   started in 07/2008/ last seizure was in 2012   Past Surgical History:  Procedure Laterality Date   ABDOMINAL HYSTERECTOMY  1989   partial, menorrhagia   CATARACT EXTRACTION Bilateral 10/26/2015, 12-04/2015   laser   COLONOSCOPY      reports that she has quit smoking. She has never used smokeless  tobacco. She reports that she does not currently use alcohol. She reports that she does not use drugs. family history includes Asthma in an other family member; Cancer in her brother, brother, maternal aunt, maternal aunt, and mother; Colon cancer in her brother, brother, cousin, and another family member; Hyperlipidemia in an other family member; Other in her sister; Ovarian cancer in her maternal aunt, maternal aunt, mother, and another family member; Pancreatic cancer in her brother; Uterine cancer in her maternal aunt, maternal aunt, mother, and another family member. Allergies  Allergen Reactions   Sulfamethoxazole-Trimethoprim Nausea Only    Review of Systems  Constitutional:  Negative for malaise/fatigue.  Eyes:  Negative for blurred vision.  Respiratory:  Negative for shortness of breath.   Cardiovascular:  Negative for chest pain.  Gastrointestinal:  Negative for nausea and vomiting.  Genitourinary:  Positive for dysuria and flank pain. Negative for hematuria.  Neurological:  Negative for dizziness, weakness and headaches.      Objective:     BP (!) 158/90 (BP Location: Left Arm, Patient Position: Sitting, Cuff Size: Small)   Pulse 80   Temp 98.3 F (36.8 C) (Oral)   Ht '4\' 10"'$  (1.473 m)   Wt 132 lb 11.2 oz (60.2  kg)   SpO2 98%   BMI 27.73 kg/m  BP Readings from Last 3 Encounters:  10/12/22 (!) 158/90  07/18/22 (!) 160/78  06/28/22 (!) 160/76   Wt Readings from Last 3 Encounters:  10/12/22 132 lb 11.2 oz (60.2 kg)  07/18/22 133 lb 1.6 oz (60.4 kg)  06/28/22 135 lb 4.8 oz (61.4 kg)      Physical Exam Vitals reviewed.  Constitutional:      General: She is not in acute distress.    Appearance: Normal appearance. She is not ill-appearing.  Cardiovascular:     Rate and Rhythm: Normal rate and regular rhythm.  Pulmonary:     Effort: Pulmonary effort is normal.     Breath sounds: Normal breath sounds.  Musculoskeletal:     Right lower leg: No edema.     Left  lower leg: No edema.     Comments: Slightly tender flank region bilaterally.  Neurological:     Mental Status: She is alert.      Results for orders placed or performed in visit on 10/12/22  POCT Urinalysis Dipstick (Automated)  Result Value Ref Range   Color, UA yellow    Clarity, UA cloudy    Glucose, UA Negative Negative   Bilirubin, UA negative    Ketones, UA negative    Spec Grav, UA <=1.005 (A) 1.010 - 1.025   Blood, UA small    pH, UA 7.0 5.0 - 8.0   Protein, UA Negative Negative   Urobilinogen, UA 0.2 0.2 or 1.0 E.U./dL   Nitrite, UA negative    Leukocytes, UA Negative Negative      The 10-year ASCVD risk score (Arnett DK, et al., 2019) is: 17.4%    Assessment & Plan:   #1 bilateral back pain with dysuria.  Doubt pyelonephritis. Check urine dipstick= shows only small blood, o/w negative.  Pt convinced early UTI with current symptoms We agreed to cover with Keflex 500 mg po qid for 5 days Stay well hydrated.    #2 hypertension suboptimally controlled.  Patient currently on amlodipine 2.5 mg daily and lisinopril HCT 20/12.5 mg 1 daily.  We discussed nonpharmacologic management with stress reduction, exercise, sodium reduction.  Increase amlodipine to 5 mg daily.  Set up 1 month follow-up to reassess.   No follow-ups on file.    Carolann Littler, MD

## 2022-10-12 NOTE — Patient Instructions (Signed)
Increase Amlodipine to 5 mg daily  Set up one month follow up to reassess BP.

## 2022-11-14 ENCOUNTER — Encounter: Payer: Self-pay | Admitting: Family Medicine

## 2022-11-14 ENCOUNTER — Ambulatory Visit: Payer: Medicare HMO | Admitting: Family Medicine

## 2022-11-14 ENCOUNTER — Ambulatory Visit (INDEPENDENT_AMBULATORY_CARE_PROVIDER_SITE_OTHER): Payer: Medicare HMO | Admitting: Family Medicine

## 2022-11-14 VITALS — BP 150/80 | HR 91 | Temp 97.9°F | Ht <= 58 in | Wt 130.9 lb

## 2022-11-14 DIAGNOSIS — I10 Essential (primary) hypertension: Secondary | ICD-10-CM | POA: Diagnosis not present

## 2022-11-14 DIAGNOSIS — R7303 Prediabetes: Secondary | ICD-10-CM | POA: Diagnosis not present

## 2022-11-14 LAB — POCT GLYCOSYLATED HEMOGLOBIN (HGB A1C): Hemoglobin A1C: 6 % — AB (ref 4.0–5.6)

## 2022-11-14 NOTE — Patient Instructions (Signed)
A1C was 6 which is stable.   Continue with low sugar/starch diet.    BP is improved!    Try to keep daily sodium < 2,500 mg

## 2022-11-14 NOTE — Progress Notes (Signed)
Established Patient Office Visit  Subjective   Patient ID: Glenda Francis, female    DOB: 29-Sep-1951  Age: 71 y.o. MRN: [id number]  Chief Complaint  Patient presents with   Follow-up    HPI   Glenda Francis is seen for follow-up hypertension and for other issue as below.  We recently increased her amlodipine to 5 mg daily in addition to her lisinopril HCTZ.  She brings in a log of home blood pressure readings as well as her cuff today.  Her home blood pressures have been consistently well-controlled.  Most of her systolics have been in the 120 range and several even lower.  Diastolics consistently in the 76s.  She feels well.  No adverse side effects from medication.  She does have history of prediabetes.  Last A1c was over a year ago and 6.2%.  She tries to avoid concentrated sweets.  She does have history of grand mal seizure but none recently.  She is followed by neurology.  On Depakote and Keppra.  Past Medical History:  Diagnosis Date   Anemia    as a child   Asthma    childhood   Cervicogenic headache 08/20/2015   Left side   Chicken pox    Depression    when son passed away   Family history of colon cancer    Family history of ovarian cancer    Family history of pancreatic cancer    Family history of stomach cancer    Family history of uterine cancer    Hypertension    Seizures ([place]) 1   started in 07/2008/ last seizure was in 2012   Past Surgical History:  Procedure Laterality Date   ABDOMINAL HYSTERECTOMY  1989   partial, menorrhagia   CATARACT EXTRACTION Bilateral 10/26/2015, 12-04/2015   laser   COLONOSCOPY      reports that she has quit smoking. She has never used smokeless tobacco. She reports that she does not currently use alcohol. She reports that she does not use drugs. family history includes Asthma in an other family member; Cancer in her brother, brother, maternal aunt, maternal aunt, and mother; Colon cancer in her brother, brother, cousin, and another  family member; Hyperlipidemia in an other family member; Other in her sister; Ovarian cancer in her maternal aunt, maternal aunt, mother, and another family member; Pancreatic cancer in her brother; Uterine cancer in her maternal aunt, maternal aunt, mother, and another family member. Allergies  Allergen Reactions   Sulfamethoxazole-Trimethoprim Nausea Only    Review of Systems  Constitutional:  Negative for malaise/fatigue.  Eyes:  Negative for blurred vision.  Respiratory:  Negative for shortness of breath.   Cardiovascular:  Negative for chest pain.  Neurological:  Negative for dizziness, weakness and headaches.      Objective:     BP (!) 150/80 (BP Location: Left Arm, Cuff Size: Normal)   Pulse 91   Temp 97.9 F (36.6 C) (Oral)   Ht '4\' 10"'$  (1.473 m)   Wt 130 lb 14.4 oz (59.4 kg)   SpO2 95%   BMI 27.36 kg/m  BP Readings from Last 3 Encounters:  11/14/22 (!) 150/80  10/12/22 (!) 158/90  07/18/22 (!) 160/78   Wt Readings from Last 3 Encounters:  11/14/22 130 lb 14.4 oz (59.4 kg)  10/12/22 132 lb 11.2 oz (60.2 kg)  07/18/22 133 lb 1.6 oz (60.4 kg)      Physical Exam Vitals reviewed.  Constitutional:      Appearance: She is  well-developed.  Eyes:     Pupils: Pupils are equal, round, and reactive to light.  Neck:     Thyroid: No thyromegaly.     Vascular: No JVD.  Cardiovascular:     Rate and Rhythm: Normal rate and regular rhythm.     Heart sounds:     No gallop.  Pulmonary:     Effort: Pulmonary effort is normal. No respiratory distress.     Breath sounds: Normal breath sounds. No wheezing or rales.  Musculoskeletal:     Cervical back: Neck supple.     Right lower leg: No edema.     Left lower leg: No edema.  Neurological:     Mental Status: She is alert.      No results found for any visits on 11/14/22.    The 10-year ASCVD risk score (Arnett DK, et al., 2019) is: 17.4%    Assessment & Plan:   #1 hypertension.  Reading was up here today but  repeat by her cuff after rest left arm seated 148/81 and repeat by me with manual cuff after rest 150/80 which shows good consistency.  Her home readings have been extremely well-controlled and we recommended following for now on current regimen of amlodipine 5 mg daily and lisinopril HCTZ 20/12.5 mg 1 daily.  She had chemistries and electrolytes in June which were stable.    #2 history of prediabetes range blood sugars.  Recommend repeat A1c today A1c 6.0%. We recommended continued low glycemic diet and consider repeat A1c in 6 to 12 months.  #3 health maintenance-recommended flu vaccine as well as Prevnar 20 but she declines both.  Glenda Littler, MD

## 2023-01-15 ENCOUNTER — Other Ambulatory Visit: Payer: Self-pay | Admitting: Family Medicine

## 2023-01-15 DIAGNOSIS — I10 Essential (primary) hypertension: Secondary | ICD-10-CM

## 2023-04-21 ENCOUNTER — Other Ambulatory Visit: Payer: Self-pay | Admitting: Family Medicine

## 2023-04-21 DIAGNOSIS — I10 Essential (primary) hypertension: Secondary | ICD-10-CM

## 2023-05-16 ENCOUNTER — Encounter: Payer: Self-pay | Admitting: Neurology

## 2023-05-16 ENCOUNTER — Ambulatory Visit: Payer: Medicare HMO | Admitting: Neurology

## 2023-05-16 VITALS — BP 172/79 | HR 81 | Ht 59.0 in | Wt 131.5 lb

## 2023-05-16 DIAGNOSIS — E871 Hypo-osmolality and hyponatremia: Secondary | ICD-10-CM | POA: Diagnosis not present

## 2023-05-16 DIAGNOSIS — G40409 Other generalized epilepsy and epileptic syndromes, not intractable, without status epilepticus: Secondary | ICD-10-CM | POA: Diagnosis not present

## 2023-05-16 DIAGNOSIS — Z5181 Encounter for therapeutic drug level monitoring: Secondary | ICD-10-CM

## 2023-05-16 NOTE — Progress Notes (Signed)
PATIENT: Glenda Francis DOB: 21-Jul-1951  REASON FOR VISIT: follow up for seizures HISTORY FROM: patient, son  PRIMARY NEUROLOGIST: Dr. Teresa Coombs  HISTORY OF PRESENT ILLNESS: Today 05/16/23 Glenda Francis: Patient presents for follow-up, she is accompanied by her son.  Last visit was a year ago since then she has been doing well.  She is on Depakote and Keppra, but she has self decrease the Keppra to 750 mg twice daily and continue on Depakote 500 mg twice daily.  She continues to do well, her last seizure was in 2012.  No other complaints or concerns.  She is interested in further decreasing the medication.  Update 05/10/2022: Patient is here today for follow-up.  On Keppra and Depakote.  At last visit in June 2022, CBC was normal.  Keppra, Depakote, CMP did not result. Last seizure was in 2012, none since Depakote was added. Lives alone, drives a car. She takes Vitamin D.   Update 05/10/2021 SS: Glenda Francis is a 72 year old female with history of well-controlled seizures on Keppra and Depakote. No seizures since last seen.  Denies medication side effect.  Lives alone, drives a car.  Denies any changes to her health.  Is on medication for her blood pressure, careful to stand slowly.  Here today accompanied by her son.  Update 09/17/2019 SS: Glenda Francis is a 72 year old female with history of well-controlled seizures on Keppra and Depakote.  She continues to tolerate her medications well.  She has not had recurrent seizure in nearly 10 years.  She lives alone, and she is able to operate a motor vehicle.  She is able to perform all of her own ADLs.  She has routine care with her OB/GYN and primary doctor.  She denies any trouble ambulating or any falls.  She denies any new problems or concerns.  She presents today for follow-up unaccompanied.  HISTORY 03/18/2019 Dr. Anne Hahn: Glenda Francis is a 72 year old right-handed Hispanic female with a history of well-controlled seizures on Keppra and Depakote.  She  tolerates his medications well, she has not had any seizures since last seen.  She is able to operate a motor vehicle.  She did have routine blood work done in February 2020.  She reports that she tries to exercise on a regular basis, she is able to sleep well at night.  No other new medical issues have come up since last seen.   REVIEW OF SYSTEMS: Out of a complete 14 system review of symptoms, the patient complains only of the following symptoms, and all other reviewed systems are negative.  See HPI  ALLERGIES: Allergies  Allergen Reactions   Sulfamethoxazole-Trimethoprim Nausea Only    HOME MEDICATIONS: Outpatient Medications Prior to Visit  Medication Sig Dispense Refill   amLODipine (NORVASC) 5 MG tablet Take 1 tablet (5 mg total) by mouth daily. 90 tablet 3   divalproex (DEPAKOTE) 500 MG DR tablet Take 1 tablet by mouth twice daily 180 tablet 4   levETIRAcetam (KEPPRA) 500 MG tablet Take 2 tablets (1,000 mg total) by mouth every 12 (twelve) hours. 360 tablet 4   lisinopril-hydrochlorothiazide (ZESTORETIC) 20-12.5 MG tablet TOME UNA TABLETA TODOS LOS DIAS 100 tablet 0   MAGNESIUM PO Take by mouth daily.     No facility-administered medications prior to visit.    PAST MEDICAL HISTORY: Past Medical History:  Diagnosis Date   Anemia    as a child   Asthma    childhood   Cervicogenic headache 08/20/2015   Left side  Chicken pox    Depression    when son passed away   Family history of colon cancer    Family history of ovarian cancer    Family history of pancreatic cancer    Family history of stomach cancer    Family history of uterine cancer    Hypertension    Seizures (HCC) 1   started in 07/2008/ last seizure was in 2012    PAST SURGICAL HISTORY: Past Surgical History:  Procedure Laterality Date   ABDOMINAL HYSTERECTOMY  1989   partial, menorrhagia   CATARACT EXTRACTION Bilateral 10/26/2015, 12-04/2015   laser   COLONOSCOPY      FAMILY HISTORY: Family  History  Problem Relation Age of Onset   Cancer Mother        uterine,ovarian   Uterine cancer Mother    Ovarian cancer Mother    Other Sister        Pos for Lynch syndrome   Cancer Brother        colon-PASSEDA WAY 54, pancreatic   Colon cancer Brother    Pancreatic cancer Brother    Cancer Brother        colon,stomach,brain   Colon cancer Brother    Cancer Maternal Aunt        uterine and ovarian   Uterine cancer Maternal Aunt    Ovarian cancer Maternal Aunt    Cancer Maternal Aunt        ovarian and uterine   Uterine cancer Maternal Aunt    Ovarian cancer Maternal Aunt    Colon cancer Cousin        6 maternal cousins with colon cancer   Colon cancer Other        died at 14   Asthma Other    Hyperlipidemia Other    Uterine cancer Other        died at 33   Ovarian cancer Other    Seizures Neg Hx     SOCIAL HISTORY: Social History   Socioeconomic History   Marital status: Divorced    Spouse name: Not on file   Number of children: 3   Years of education: college   Highest education level: Not on file  Occupational History   Occupation: retired  Tobacco Use   Smoking status: Former   Smokeless tobacco: Never   Tobacco comments:    only Manufacturing systems engineer Use: Never used  Substance and Sexual Activity   Alcohol use: Yes    Alcohol/week: 1.0 standard drink of alcohol    Types: 1 Glasses of wine per week    Comment: socially   Drug use: No   Sexual activity: Not Currently  Other Topics Concern   Not on file  Social History Narrative   Patient is divorced   Patient  has 3 children.   Patient is retired   Patient has some college.   Patient is currently doing yoga and water aerobics.   Patient drinks 1-2 cups of caffeine daily.   Patient is right handed.    Social Determinants of Health   Financial Resource Strain: Low Risk  (10/15/2021)   Overall Financial Resource Strain (CARDIA)    Difficulty of Paying Living Expenses: Not hard at all   Food Insecurity: No Food Insecurity (10/15/2021)   Hunger Vital Sign    Worried About Running Out of Food in the Last Year: Never true    Ran Out of Food in the Last Year: Never true  Transportation Needs: No Transportation Needs (10/15/2021)   PRAPARE - Administrator, Civil Service (Medical): No    Lack of Transportation (Non-Medical): No  Physical Activity: Insufficiently Active (10/15/2021)   Exercise Vital Sign    Days of Exercise per Week: 5 days    Minutes of Exercise per Session: 20 min  Stress: No Stress Concern Present (10/15/2021)   [employer] of Occupational Health - Occupational Stress Questionnaire    Feeling of Stress : Only a little  Social Connections: Socially Isolated (10/15/2021)   Social Connection and Isolation Panel [NHANES]    Frequency of Communication with Friends and Family: More than three times a week    Frequency of Social Gatherings with Friends and Family: More than three times a week    Attends Religious Services: Never    Database administrator or Organizations: No    Attends Banker Meetings: Never    Marital Status: Divorced  Catering manager Violence: Not At Risk (10/15/2021)   Humiliation, Afraid, Rape, and Kick questionnaire    Fear of Current or Ex-Partner: No    Emotionally Abused: No    Physically Abused: No    Sexually Abused: No   PHYSICAL EXAM  Vitals:   05/16/23 1313  BP: (!) 172/79  Pulse: 81  Weight: 131 lb 8 oz (59.6 kg)  Height: 4\' 11"  (1.499 m)   Body mass index is 26.56 kg/m.  Generalized: Well developed, in no acute distress   Neurological examination  Mentation: Alert oriented to time, place, history taking. Follows all commands speech and language fluent, very pleasant Cranial nerve II-XII: Pupils were equal round reactive to light. Extraocular movements were full, visual field were full on confrontational test. Facial sensation and strength were normal. Head turning and shoulder  shrug  were normal and symmetric. Motor: The motor testing reveals 5 over 5 strength of all 4 extremities. Good symmetric motor tone is noted throughout.  Sensory: Sensory testing is intact to soft touch on all 4 extremities. No evidence of extinction is noted.  Coordination: Cerebellar testing reveals good finger-nose-finger and heel-to-shin bilaterally.  Gait and station: Gait is normal. Tandem gait is normal.  Reflexes: Deep tendon reflexes are symmetric and normal bilaterally.   DIAGNOSTIC DATA (LABS, IMAGING, TESTING) - I reviewed patient records, labs, notes, testing and imaging myself where available.  Lab Results  Component Value Date   WBC 4.8 05/10/2022   HGB 13.0 05/10/2022   HCT 38.1 05/10/2022   MCV 88 05/10/2022   PLT 245 05/10/2022      Component Value Date/Time   NA 131 (L) 05/10/2022 1521   K 3.9 05/10/2022 1521   CL 89 (L) 05/10/2022 1521   CO2 30 (H) 05/10/2022 1521   GLUCOSE 156 (H) 05/10/2022 1521   GLUCOSE 83 01/12/2021 0841   BUN 9 05/10/2022 1521   CREATININE 0.51 (L) 05/10/2022 1521   CALCIUM 9.8 05/10/2022 1521   PROT 6.8 05/10/2022 1521   ALBUMIN 4.2 05/10/2022 1521   AST 15 05/10/2022 1521   ALT 10 05/10/2022 1521   ALKPHOS 60 05/10/2022 1521   BILITOT <0.2 05/10/2022 1521   GFRNONAA >60 03/10/2020 0302   GFRAA >60 03/10/2020 0302   Lab Results  Component Value Date   CHOL 217 (H) 01/29/2020   HDL 52.90 01/29/2020   LDLCALC 124 (H) 01/29/2020   TRIG 200.0 (H) 01/29/2020   CHOLHDL 4 01/29/2020   Lab Results  Component Value Date   HGBA1C 6.0 (  A) 11/14/2022   No results found for: "VITAMINB12" Lab Results  Component Value Date   TSH 0.329 (L) 07/16/2010   ASSESSMENT AND PLAN 72 y.o. year old female   1.  History of seizures well-controlled  -Doing well, last seizure was in 2012 when Depakote was added -Continue with Depakote 500 mg twice daily  -Decrease Keppra to 500 mg twice daily -Check labs today -In the past, EEG has shown  evidence of left brain sharp transients. Will repeat EEG -Follow-up in 1 year or sooner     Windell Norfolk, MD 05/16/2023, 1:58 PM [facility] [address] [place], [place] [address] [phone]

## 2023-05-16 NOTE — Patient Instructions (Signed)
Continue with depakote 500 mg twice daily  Decrease Keppra to 500 mg twice daily Will check antiseizure medication level with CMP and CBC Routine EEG Follow-up in 1 year or sooner if worse

## 2023-05-17 ENCOUNTER — Other Ambulatory Visit: Payer: Self-pay | Admitting: Neurology

## 2023-05-17 ENCOUNTER — Telehealth: Payer: Self-pay

## 2023-05-17 DIAGNOSIS — E871 Hypo-osmolality and hyponatremia: Secondary | ICD-10-CM

## 2023-05-17 LAB — COMPREHENSIVE METABOLIC PANEL
ALT: 13 IU/L (ref 0–32)
AST: 18 IU/L (ref 0–40)
Albumin: 4.3 g/dL (ref 3.9–4.9)
Alkaline Phosphatase: 54 IU/L (ref 44–121)
BUN/Creatinine Ratio: 18 (ref 12–28)
BUN: 10 mg/dL (ref 8–27)
Bilirubin Total: 0.3 mg/dL (ref 0.0–1.2)
CO2: 30 mmol/L — ABNORMAL HIGH (ref 20–29)
Calcium: 10.1 mg/dL (ref 8.7–10.3)
Chloride: 85 mmol/L — ABNORMAL LOW (ref 96–106)
Creatinine, Ser: 0.57 mg/dL (ref 0.57–1.00)
Globulin, Total: 2.9 g/dL (ref 1.5–4.5)
Glucose: 124 mg/dL — ABNORMAL HIGH (ref 70–99)
Potassium: 4.4 mmol/L (ref 3.5–5.2)
Sodium: 129 mmol/L — ABNORMAL LOW (ref 134–144)
Total Protein: 7.2 g/dL (ref 6.0–8.5)
eGFR: 98 mL/min/{1.73_m2} (ref >59)

## 2023-05-17 LAB — CBC
Hematocrit: 40.9 % (ref 34.0–46.6)
Hemoglobin: 14.1 g/dL (ref 11.1–15.9)
MCH: 30.7 pg (ref 26.6–33.0)
MCHC: 34.5 g/dL (ref 31.5–35.7)
MCV: 89 fL (ref 79–97)
Platelets: 304 10*3/uL (ref 150–450)
RBC: 4.59 x10E6/uL (ref 3.77–5.28)
RDW: 11.9 % (ref 11.7–15.4)
WBC: 5.1 10*3/uL (ref 3.4–10.8)

## 2023-05-17 LAB — LEVETIRACETAM LEVEL: Levetiracetam Lvl: 42.4 ug/mL — ABNORMAL HIGH (ref 10.0–40.0)

## 2023-05-17 LAB — VALPROIC ACID LEVEL: Valproic Acid Lvl: 85 ug/mL (ref 50–100)

## 2023-05-17 NOTE — Addendum Note (Signed)
Addended byWindell Norfolk on: 05/17/2023 02:18 PM   Modules accepted: Orders

## 2023-05-17 NOTE — Telephone Encounter (Signed)
-----   Message from Windell Norfolk, MD sent at 05/17/2023  2:17 PM EDT ----- Lura Em  Your recent labs we checked were within normal limits except for a low Sodium level. Please inform patient that I would like to repeat the Sodium in 2 weeks. Please have patient stop by the office the week of July 8 for lab, no appointment needed. Please keep any upcoming appointments or tests and call us with any interim questions, concerns, problems or updates. Thanks,   Windell Norfolk, MD

## 2023-05-17 NOTE — Progress Notes (Signed)
Ok for patient to have labs done on 7/3

## 2023-05-17 NOTE — Telephone Encounter (Signed)
Call to patient with lab results and recommendations, patient verbalized understanding. She did ask if lab work could be done on7/3/24 with she has appt with sleep lab. Advised I would ask Dr. Teresa Coombs and respond via my chart. Patient in agreement

## 2023-05-31 ENCOUNTER — Ambulatory Visit: Payer: Medicare HMO | Admitting: Neurology

## 2023-05-31 DIAGNOSIS — E871 Hypo-osmolality and hyponatremia: Secondary | ICD-10-CM | POA: Diagnosis not present

## 2023-05-31 DIAGNOSIS — G4089 Other seizures: Secondary | ICD-10-CM

## 2023-05-31 DIAGNOSIS — G40409 Other generalized epilepsy and epileptic syndromes, not intractable, without status epilepticus: Secondary | ICD-10-CM

## 2023-06-01 LAB — BASIC METABOLIC PANEL
BUN/Creatinine Ratio: 24 (ref 12–28)
BUN: 14 mg/dL (ref 8–27)
CO2: 32 mmol/L — ABNORMAL HIGH (ref 20–29)
Calcium: 10.1 mg/dL (ref 8.7–10.3)
Chloride: 85 mmol/L — ABNORMAL LOW (ref 96–106)
Creatinine, Ser: 0.59 mg/dL (ref 0.57–1.00)
Glucose: 98 mg/dL (ref 70–99)
Potassium: 4.3 mmol/L (ref 3.5–5.2)
Sodium: 130 mmol/L — ABNORMAL LOW (ref 134–144)
eGFR: 97 mL/min/{1.73_m2} (ref >59)

## 2023-06-05 NOTE — Procedures (Signed)
    History:  72 year old woman with seizure  EEG classification: Awake and drowsy  Description of the recording: The background rhythms of this recording consists of a fairly well modulated medium amplitude alpha rhythm of 9 Hz that is reactive to eye opening and closure. Present in the anterior head region is a 15-20 Hz beta activity. Photic stimulation was performed, did not show any abnormalities. Hyperventilation was also performed, did not show any abnormalities. Drowsiness was manifested by background fragmentation. No abnormal epileptiform discharges seen during this recording. There was no focal slowing. There were no electrographic seizure identified.   Abnormality: None   Impression: This is a normal EEG recorded while drowsy and awake. No evidence of interictal epileptiform discharges. Normal EEGs, however, do not rule out epilepsy.    Glenda Norfolk, MD [facility]

## 2023-07-11 DIAGNOSIS — J3089 Other allergic rhinitis: Secondary | ICD-10-CM | POA: Diagnosis not present

## 2023-07-11 DIAGNOSIS — H6122 Impacted cerumen, left ear: Secondary | ICD-10-CM | POA: Diagnosis not present

## 2023-07-11 DIAGNOSIS — K219 Gastro-esophageal reflux disease without esophagitis: Secondary | ICD-10-CM | POA: Diagnosis not present

## 2023-07-13 DIAGNOSIS — R350 Frequency of micturition: Secondary | ICD-10-CM | POA: Diagnosis not present

## 2023-07-13 DIAGNOSIS — B9620 Unspecified Escherichia coli [E. coli] as the cause of diseases classified elsewhere: Secondary | ICD-10-CM | POA: Diagnosis not present

## 2023-07-13 DIAGNOSIS — N10 Acute tubulo-interstitial nephritis: Secondary | ICD-10-CM | POA: Diagnosis not present

## 2023-07-13 DIAGNOSIS — N390 Urinary tract infection, site not specified: Secondary | ICD-10-CM | POA: Diagnosis not present

## 2023-07-13 DIAGNOSIS — R351 Nocturia: Secondary | ICD-10-CM | POA: Diagnosis not present

## 2023-07-26 ENCOUNTER — Other Ambulatory Visit: Payer: Self-pay | Admitting: Neurology

## 2023-07-28 ENCOUNTER — Other Ambulatory Visit: Payer: Self-pay | Admitting: Family Medicine

## 2023-07-28 DIAGNOSIS — I10 Essential (primary) hypertension: Secondary | ICD-10-CM

## 2023-08-01 ENCOUNTER — Encounter: Payer: Medicare HMO | Admitting: Family Medicine

## 2023-08-01 ENCOUNTER — Encounter: Payer: Medicare HMO | Admitting: Obstetrics and Gynecology

## 2023-08-03 DIAGNOSIS — N10 Acute tubulo-interstitial nephritis: Secondary | ICD-10-CM | POA: Diagnosis not present

## 2023-09-18 DIAGNOSIS — E063 Autoimmune thyroiditis: Secondary | ICD-10-CM | POA: Diagnosis not present

## 2023-10-19 ENCOUNTER — Encounter: Payer: Self-pay | Admitting: Internal Medicine

## 2023-10-27 ENCOUNTER — Other Ambulatory Visit: Payer: Self-pay | Admitting: Family Medicine

## 2023-10-27 DIAGNOSIS — I10 Essential (primary) hypertension: Secondary | ICD-10-CM

## 2023-11-02 ENCOUNTER — Encounter: Payer: Self-pay | Admitting: Internal Medicine

## 2023-12-15 ENCOUNTER — Ambulatory Visit (INDEPENDENT_AMBULATORY_CARE_PROVIDER_SITE_OTHER): Payer: No Typology Code available for payment source | Admitting: Adult Health

## 2023-12-15 VITALS — BP 160/80 | HR 95 | Temp 98.2°F | Ht 59.0 in | Wt 130.0 lb

## 2023-12-15 DIAGNOSIS — B9789 Other viral agents as the cause of diseases classified elsewhere: Secondary | ICD-10-CM | POA: Diagnosis not present

## 2023-12-15 DIAGNOSIS — I10 Essential (primary) hypertension: Secondary | ICD-10-CM | POA: Diagnosis not present

## 2023-12-15 DIAGNOSIS — J028 Acute pharyngitis due to other specified organisms: Secondary | ICD-10-CM

## 2023-12-15 LAB — POCT RAPID STREP A (OFFICE): Rapid Strep A Screen: NEGATIVE

## 2023-12-15 NOTE — Progress Notes (Signed)
Subjective:    Patient ID: Glenda Francis, female    DOB: 21-Aug-1951, 73 y.o.   MRN: [id number]  HPI 73 year old female who  has a past medical history of Anemia, Asthma, Cervicogenic headache (08/20/2015), Chicken pox, Depression, Family history of colon cancer, Family history of ovarian cancer, Family history of pancreatic cancer, Family history of stomach cancer, Family history of uterine cancer, Hypertension, and Seizures (HCC) (1).  She is a patient of Dr. Caryl Never who I am seeing today for an acute issue.  She reports that for the last three days she has had a sore throat for the last three days. She denies fevers or chills. She does not feel acutely ill. Has not had any shortness of breath or wheezing. No sinus issues.      Review of Systems See HPI   Past Medical History:  Diagnosis Date   Anemia    as a child   Asthma    childhood   Cervicogenic headache 08/20/2015   Left side   Chicken pox    Depression    when son passed away   Family history of colon cancer    Family history of ovarian cancer    Family history of pancreatic cancer    Family history of stomach cancer    Family history of uterine cancer    Hypertension    Seizures (HCC) 1   started in 07/2008/ last seizure was in 2012    Social History   Socioeconomic History   Marital status: Divorced    Spouse name: Not on file   Number of children: 3   Years of education: college   Highest education level: Not on file  Occupational History   Occupation: retired  Tobacco Use   Smoking status: Former   Smokeless tobacco: Never   Tobacco comments:    only Financial trader   Vaping status: Never Used  Substance and Sexual Activity   Alcohol use: Yes    Alcohol/week: 1.0 standard drink of alcohol    Types: 1 Glasses of wine per week    Comment: socially   Drug use: No   Sexual activity: Not Currently  Other Topics Concern   Not on file  Social History Narrative   Patient is divorced   Patient   has 3 children.   Patient is retired   Patient has some college.   Patient is currently doing yoga and water aerobics.   Patient drinks 1-2 cups of caffeine daily.   Patient is right handed.    Social Drivers of Corporate investment banker Strain: Low Risk  (10/15/2021)   Overall Financial Resource Strain (CARDIA)    Difficulty of Paying Living Expenses: Not hard at all  Food Insecurity: No Food Insecurity (10/15/2021)   Hunger Vital Sign    Worried About Running Out of Food in the Last Year: Never true    Ran Out of Food in the Last Year: Never true  Transportation Needs: No Transportation Needs (10/15/2021)   PRAPARE - Administrator, Civil Service (Medical): No    Lack of Transportation (Non-Medical): No  Physical Activity: Insufficiently Active (10/15/2021)   Exercise Vital Sign    Days of Exercise per Week: 5 days    Minutes of Exercise per Session: 20 min  Stress: No Stress Concern Present (10/15/2021)   [employer] of Occupational Health - Occupational Stress Questionnaire    Feeling of Stress : Only a little  Social Connections: Socially Isolated (10/15/2021)   Social Connection and Isolation Panel [NHANES]    Frequency of Communication with Friends and Family: More than three times a week    Frequency of Social Gatherings with Friends and Family: More than three times a week    Attends Religious Services: Never    Database administrator or Organizations: No    Attends Banker Meetings: Never    Marital Status: Divorced  Catering manager Violence: Not At Risk (10/15/2021)   Humiliation, Afraid, Rape, and Kick questionnaire    Fear of Current or Ex-Partner: No    Emotionally Abused: No    Physically Abused: No    Sexually Abused: No    Past Surgical History:  Procedure Laterality Date   ABDOMINAL HYSTERECTOMY  1989   partial, menorrhagia   CATARACT EXTRACTION Bilateral 10/26/2015, 12-04/2015   laser   COLONOSCOPY      Family  History  Problem Relation Age of Onset   Cancer Mother        uterine,ovarian   Uterine cancer Mother    Ovarian cancer Mother    Other Sister        Pos for Lynch syndrome   Cancer Brother        colon-PASSEDA WAY 54, pancreatic   Colon cancer Brother    Pancreatic cancer Brother    Cancer Brother        colon,stomach,brain   Colon cancer Brother    Cancer Maternal Aunt        uterine and ovarian   Uterine cancer Maternal Aunt    Ovarian cancer Maternal Aunt    Cancer Maternal Aunt        ovarian and uterine   Uterine cancer Maternal Aunt    Ovarian cancer Maternal Aunt    Colon cancer Cousin        6 maternal cousins with colon cancer   Colon cancer Other        died at 19   Asthma Other    Hyperlipidemia Other    Uterine cancer Other        died at 82   Ovarian cancer Other    Seizures Neg Hx     Allergies  Allergen Reactions   Sulfamethoxazole-Trimethoprim Nausea Only    Current Outpatient Medications on File Prior to Visit  Medication Sig Dispense Refill   amLODipine (NORVASC) 5 MG tablet Take 1 tablet (5 mg total) by mouth daily. 90 tablet 3   divalproex (DEPAKOTE) 500 MG DR tablet TOME UNA TABLETA DOS VECES AL DIA 180 tablet 4   levETIRAcetam (KEPPRA) 500 MG tablet TAKE 2 TABLETS (1,000 MG TOTAL) BY MOUTH EVERY 12 (TWELVE) HOURS. 360 tablet 4   lisinopril-hydrochlorothiazide (ZESTORETIC) 20-12.5 MG tablet TOME 1 TABLETA POR VIA ORAL TODOS LOS DIAS 100 tablet 0   MAGNESIUM PO Take by mouth daily.     No current facility-administered medications on file prior to visit.    BP (!) 160/80   Pulse 95   Temp 98.2 F (36.8 C) (Oral)   Ht 4\' 11"  (1.499 m)   Wt 130 lb (59 kg)   SpO2 98%   BMI 26.26 kg/m       Objective:   Physical Exam Vitals and nursing note reviewed.  Constitutional:      Appearance: Normal appearance.  HENT:     Mouth/Throat:     Mouth: Mucous membranes are moist.     Pharynx: Posterior oropharyngeal erythema (mild) present.  No  oropharyngeal exudate.     Tonsils: No tonsillar exudate or tonsillar abscesses.  Cardiovascular:     Rate and Rhythm: Normal rate and regular rhythm.     Pulses: Normal pulses.     Heart sounds: Normal heart sounds.  Pulmonary:     Effort: Pulmonary effort is normal.     Breath sounds: Normal breath sounds.  Skin:    General: Skin is dry.  Neurological:     General: No focal deficit present.     Mental Status: She is alert and oriented to person, place, and time.  Psychiatric:        Mood and Affect: Mood normal.        Behavior: Behavior normal.        Thought Content: Thought content normal.        Judgment: Judgment normal.       Assessment & Plan:  1. Viral sore throat (Primary) - Felt to be viral. Advised that this usually lasts 5-7 days. She can use NSAIDS as needed and can do warm salt water gargles  - POC Rapid Strep A- negative   2. Essential hypertension - Elevated in the office. She reports that this is usually the case but has normal blood pressure at home   Shirline Frees, NP

## 2024-01-03 ENCOUNTER — Encounter: Payer: Self-pay | Admitting: Internal Medicine

## 2024-01-03 ENCOUNTER — Ambulatory Visit (AMBULATORY_SURGERY_CENTER): Payer: No Typology Code available for payment source

## 2024-01-03 VITALS — Ht 59.0 in | Wt 128.0 lb

## 2024-01-03 DIAGNOSIS — Z860100 Personal history of colon polyps, unspecified: Secondary | ICD-10-CM

## 2024-01-03 DIAGNOSIS — Z800 Family history of malignant neoplasm of digestive organs: Secondary | ICD-10-CM

## 2024-01-03 MED ORDER — NA SULFATE-K SULFATE-MG SULF 17.5-3.13-1.6 GM/177ML PO SOLN
1.0000 | Freq: Once | ORAL | 0 refills | Status: AC
Start: 2024-01-03 — End: 2024-01-03

## 2024-01-03 NOTE — Progress Notes (Signed)

## 2024-01-17 ENCOUNTER — Encounter: Payer: Self-pay | Admitting: Internal Medicine

## 2024-01-17 ENCOUNTER — Ambulatory Visit (AMBULATORY_SURGERY_CENTER): Payer: No Typology Code available for payment source | Admitting: Internal Medicine

## 2024-01-17 VITALS — BP 149/77 | HR 61 | Temp 97.9°F | Resp 14 | Ht 59.0 in | Wt 128.0 lb

## 2024-01-17 DIAGNOSIS — Z860101 Personal history of adenomatous and serrated colon polyps: Secondary | ICD-10-CM | POA: Diagnosis not present

## 2024-01-17 DIAGNOSIS — K5730 Diverticulosis of large intestine without perforation or abscess without bleeding: Secondary | ICD-10-CM | POA: Diagnosis not present

## 2024-01-17 DIAGNOSIS — Z800 Family history of malignant neoplasm of digestive organs: Secondary | ICD-10-CM

## 2024-01-17 DIAGNOSIS — I10 Essential (primary) hypertension: Secondary | ICD-10-CM | POA: Diagnosis not present

## 2024-01-17 DIAGNOSIS — Z1211 Encounter for screening for malignant neoplasm of colon: Secondary | ICD-10-CM | POA: Diagnosis not present

## 2024-01-17 DIAGNOSIS — Z860100 Personal history of colon polyps, unspecified: Secondary | ICD-10-CM

## 2024-01-17 MED ORDER — SODIUM CHLORIDE 0.9 % IV SOLN
500.0000 mL | Freq: Once | INTRAVENOUS | Status: DC
Start: 2024-01-17 — End: 2024-01-17

## 2024-01-17 NOTE — Progress Notes (Unsigned)
 Sedate, gd SR, tolerated procedure well, VSS, report to RN

## 2024-01-17 NOTE — Patient Instructions (Signed)
 Educational handout provided to patient related to Diverticulosis  Resume previous diet  Continue present medications   Repeat colonoscopy in 5 years for surveillance  YOU HAD AN ENDOSCOPIC PROCEDURE TODAY AT THE [facility]:   Refer to the procedure report that was given to you for any specific questions about what was found during the examination.  If the procedure report does not answer your questions, please call your gastroenterologist to clarify.  If you requested that your care partner not be given the details of your procedure findings, then the procedure report has been included in a sealed envelope for you to review at your convenience later.  YOU SHOULD EXPECT: Some feelings of bloating in the abdomen. Passage of more gas than usual.  Walking can help get rid of the air that was put into your GI tract during the procedure and reduce the bloating. If you had a lower endoscopy (such as a colonoscopy or flexible sigmoidoscopy) you may notice spotting of blood in your stool or on the toilet paper. If you underwent a bowel prep for your procedure, you may not have a normal bowel movement for a few days.  Please Note:  You might notice some irritation and congestion in your nose or some drainage.  This is from the oxygen used during your procedure.  There is no need for concern and it should clear up in a day or so.  SYMPTOMS TO REPORT IMMEDIATELY:  Following lower endoscopy (colonoscopy or flexible sigmoidoscopy):  Excessive amounts of blood in the stool  Significant tenderness or worsening of abdominal pains  Swelling of the abdomen that is new, acute  Fever of 100F or higher  For urgent or emergent issues, a gastroenterologist can be reached at any hour by calling [phone]. Do not use MyChart messaging for urgent concerns.    DIET:  We do recommend a small meal at first, but then you may proceed to your regular diet.  Drink plenty of fluids but you should avoid  alcoholic beverages for 24 hours.  ACTIVITY:  You should plan to take it easy for the rest of today and you should NOT DRIVE or use heavy machinery until tomorrow (because of the sedation medicines used during the test).    FOLLOW UP: Our staff will call the number listed on your records the next business day following your procedure.  We will call around 7:15- 8:00 am to check on you and address any questions or concerns that you may have regarding the information given to you following your procedure. If we do not reach you, we will leave a message.     If any biopsies were taken you will be contacted by phone or by letter within the next 1-3 weeks.  Please call us at [phone] if you have not heard about the biopsies in 3 weeks.    SIGNATURES/CONFIDENTIALITY: You and/or your care partner have signed paperwork which will be entered into your electronic medical record.  These signatures attest to the fact that that the information above on your After Visit Summary has been reviewed and is understood.  Full responsibility of the confidentiality of this discharge information lies with you and/or your care-partner.

## 2024-01-17 NOTE — Progress Notes (Unsigned)
 GASTROENTEROLOGY PROCEDURE H&P NOTE   Primary Care Physician: Kristian Covey, MD    Reason for Procedure:  Family history of colon cancer and personal history of adenomatous polyp  Plan:    Colonoscopy  Patient is appropriate for endoscopic procedure(s) in the ambulatory ([facility]) setting.  The nature of the procedure, as well as the risks, benefits, and alternatives were carefully and thoroughly reviewed with the patient. Ample time for discussion and questions allowed. The patient understood, was satisfied, and agreed to proceed.     HPI: Glenda Francis is a 73 y.o. female who presents for colonoscopy.  Medical history as below.  Tolerated the prep.  No recent chest pain or shortness of breath.  No abdominal pain today.  Past Medical History:  Diagnosis Date   Anemia    as a child   Asthma    childhood   Cervicogenic headache 08/20/2015   Left side   Chicken pox    Depression    when son passed away   Family history of colon cancer    Family history of ovarian cancer    Family history of pancreatic cancer    Family history of stomach cancer    Family history of uterine cancer    Hypertension    Seizures (HCC) 1   started in 07/2008/ last seizure was in 2012    Past Surgical History:  Procedure Laterality Date   ABDOMINAL HYSTERECTOMY  1989   partial, menorrhagia   CATARACT EXTRACTION Bilateral 10/26/2015, 12-04/2015   laser   COLONOSCOPY      Prior to Admission medications   Medication Sig Start Date End Date Taking? Authorizing Provider  amLODipine (NORVASC) 5 MG tablet Take 1 tablet (5 mg total) by mouth daily. Patient not taking: Reported on 01/03/2024 10/12/22   Kristian Covey, MD  divalproex (DEPAKOTE) 500 MG DR tablet TOME UNA TABLETA DOS VECES AL DIA 07/26/23   Glean Salvo, NP  levETIRAcetam (KEPPRA) 500 MG tablet TAKE 2 TABLETS (1,000 MG TOTAL) BY MOUTH EVERY 12 (TWELVE) HOURS. 07/26/23   Glean Salvo, NP  lisinopril-hydrochlorothiazide  (ZESTORETIC) 20-12.5 MG tablet TOME 1 TABLETA POR VIA ORAL TODOS LOS DIAS 10/30/23   Burchette, Elberta Fortis, MD  MAGNESIUM PO Take by mouth daily.    [provider]    Current Outpatient Medications  Medication Sig Dispense Refill   amLODipine (NORVASC) 5 MG tablet Take 1 tablet (5 mg total) by mouth daily. (Patient not taking: Reported on 01/03/2024) 90 tablet 3   divalproex (DEPAKOTE) 500 MG DR tablet TOME UNA TABLETA DOS VECES AL DIA 180 tablet 4   levETIRAcetam (KEPPRA) 500 MG tablet TAKE 2 TABLETS (1,000 MG TOTAL) BY MOUTH EVERY 12 (TWELVE) HOURS. 360 tablet 4   lisinopril-hydrochlorothiazide (ZESTORETIC) 20-12.5 MG tablet TOME 1 TABLETA POR VIA ORAL TODOS LOS DIAS 100 tablet 0   MAGNESIUM PO Take by mouth daily.     Current Facility-Administered Medications  Medication Dose Route Frequency Provider Last Rate Last Admin   0.9 %  sodium chloride infusion  500 mL Intravenous Once Loney Peto, Carie Caddy, MD        Allergies as of 01/17/2024 - Review Complete 01/17/2024  Allergen Reaction Noted   Sulfamethoxazole-trimethoprim Nausea Only 12/17/2021    Family History  Problem Relation Age of Onset   Cancer Mother        uterine,ovarian   Uterine cancer Mother    Ovarian cancer Mother    Other Sister  Pos for Lynch syndrome   Cancer Brother        colon-PASSEDA WAY 54, pancreatic   Colon cancer Brother    Pancreatic cancer Brother    Cancer Brother        colon,stomach,brain   Colon cancer Brother    Cancer Maternal Aunt        uterine and ovarian   Uterine cancer Maternal Aunt    Ovarian cancer Maternal Aunt    Cancer Maternal Aunt        ovarian and uterine   Uterine cancer Maternal Aunt    Ovarian cancer Maternal Aunt    Colon cancer Cousin        6 maternal cousins with colon cancer   Colon cancer Other        died at 24   Asthma Other    Hyperlipidemia Other    Uterine cancer Other        died at 66   Ovarian cancer Other    Seizures Neg Hx     Social  History   Socioeconomic History   Marital status: Divorced    Spouse name: Not on file   Number of children: 3   Years of education: college   Highest education level: Not on file  Occupational History   Occupation: retired  Tobacco Use   Smoking status: Former   Smokeless tobacco: Never   Tobacco comments:    only Financial trader   Vaping status: Never Used  Substance and Sexual Activity   Alcohol use: Yes    Alcohol/week: 1.0 standard drink of alcohol    Types: 1 Glasses of wine per week    Comment: socially   Drug use: No   Sexual activity: Not Currently  Other Topics Concern   Not on file  Social History Narrative   Patient is divorced   Patient  has 3 children.   Patient is retired   Patient has some college.   Patient is currently doing yoga and water aerobics.   Patient drinks 1-2 cups of caffeine daily.   Patient is right handed.    Social Drivers of Corporate investment banker Strain: Low Risk  (10/15/2021)   Overall Financial Resource Strain (CARDIA)    Difficulty of Paying Living Expenses: Not hard at all  Food Insecurity: No Food Insecurity (10/15/2021)   Hunger Vital Sign    Worried About Running Out of Food in the Last Year: Never true    Ran Out of Food in the Last Year: Never true  Transportation Needs: No Transportation Needs (10/15/2021)   PRAPARE - Administrator, Civil Service (Medical): No    Lack of Transportation (Non-Medical): No  Physical Activity: Insufficiently Active (10/15/2021)   Exercise Vital Sign    Days of Exercise per Week: 5 days    Minutes of Exercise per Session: 20 min  Stress: No Stress Concern Present (10/15/2021)   [employer] of Occupational Health - Occupational Stress Questionnaire    Feeling of Stress : Only a little  Social Connections: Socially Isolated (10/15/2021)   Social Connection and Isolation Panel [NHANES]    Frequency of Communication with Friends and Family: More than three times a  week    Frequency of Social Gatherings with Friends and Family: More than three times a week    Attends Religious Services: Never    Database administrator or Organizations: No    Attends Banker Meetings: Never  Marital Status: Divorced  Catering manager Violence: Not At Risk (10/15/2021)   Humiliation, Afraid, Rape, and Kick questionnaire    Fear of Current or Ex-Partner: No    Emotionally Abused: No    Physically Abused: No    Sexually Abused: No    Physical Exam: Vital signs in last 24 hours: @BP  139/76 (BP Location: Right Arm, Patient Position: Sitting, Cuff Size: Normal)   Pulse 76   Temp 97.9 F (36.6 C) (Temporal)   Ht 4\' 11"  (1.499 m)   Wt 128 lb (58.1 kg)   SpO2 98%   BMI 25.85 kg/m  GEN: NAD EYE: Sclerae anicteric ENT: MMM CV: Non-tachycardic Pulm: CTA b/l GI: Soft, NT/ND NEURO:  Alert & Oriented x 3   Erick Blinks, MD [facility]  01/17/2024 11:04 AM

## 2024-01-17 NOTE — Progress Notes (Unsigned)
 Vitals-Autumn  Pt's states no medical or surgical changes since previsit or office visit.

## 2024-01-17 NOTE — Op Note (Signed)
 [facility] Patient Name: Glenda Francis Procedure Date: 01/17/2024 11:16 AM MRN: [id number] Endoscopist: Beverley Fiedler , MD, [id number] Age: 73 Referring MD:  Date of Birth: 1951/02/08 Gender: Female Account #: [id number] Procedure:                Colonoscopy Indications:              High risk colon cancer surveillance: Personal                            history of non-advanced adenoma, Family history of                            colon cancer in multiple first-degree relatives Medicines:                Monitored Anesthesia Care Procedure:                Pre-Anesthesia Assessment:                           - Prior to the procedure, a History and Physical                            was performed, and patient medications and                            allergies were reviewed. The patient's tolerance of                            previous anesthesia was also reviewed. The risks                            and benefits of the procedure and the sedation                            options and risks were discussed with the patient.                            All questions were answered, and informed consent                            was obtained. Prior Anticoagulants: The patient has                            taken no anticoagulant or antiplatelet agents. ASA                            Grade Assessment: II - A patient with mild systemic                            disease. After reviewing the risks and benefits,                            the patient was deemed in satisfactory condition to  undergo the procedure.                           After obtaining informed consent, the colonoscope                            was passed under direct vision. Throughout the                            procedure, the patient's blood pressure, pulse, and                            oxygen saturations were monitored continuously. The                            [id number]-[id number]  Colonoscope [id number] was introduced                            through the anus and advanced to the terminal                            ileum. The colonoscopy was performed without                            difficulty. The patient tolerated the procedure                            well. The quality of the bowel preparation was                            good. The terminal ileum, ileocecal valve,                            appendiceal orifice, and rectum were photographed. Scope In: 11:17:39 AM Scope Out: 11:28:57 AM Scope Withdrawal Time: 0 hours 9 minutes 0 seconds  Total Procedure Duration: 0 hours 11 minutes 18 seconds  Findings:                 The digital rectal exam was normal.                           The terminal ileum appeared normal.                           Many medium-mouthed and small-mouthed diverticula                            were found from cecum to sigmoid colon.                           The exam was otherwise without abnormality on                            direct and retroflexion views. Complications:            No immediate complications. Estimated Blood Loss:  Estimated blood loss: none. Impression:               - The examined portion of the ileum was normal.                           - Severe diverticulosis from cecum to sigmoid colon.                           - The examination was otherwise normal on direct                            and retroflexion views.                           - No specimens collected. Recommendation:           - Patient has a contact number available for                            emergencies. The signs and symptoms of potential                            delayed complications were discussed with the                            patient. Return to normal activities tomorrow.                            Written discharge instructions were provided to the                            patient.                           - Resume previous  diet.                           - Continue present medications.                           - Repeat colonoscopy in 5 years for surveillance.                           - In light of family history of stomach/gastric                            cancer, screening EGD is reasonable. Patient states                            she prefers to wait until after a June trip to                            schedule. She can call back when she is ready to  schedule screening EGD in [facility]. Beverley Fiedler, MD 01/17/2024 11:36:02 AM This report has been signed electronically.

## 2024-01-18 ENCOUNTER — Telehealth: Payer: Self-pay

## 2024-01-18 NOTE — Telephone Encounter (Signed)
  Follow up Call-     01/17/2024   11:01 AM 01/17/2024   10:56 AM  Call back number  Post procedure Call Back phone  # [phone]  son   Permission to leave phone message  Yes     Patient questions:  Do you have a fever, pain , or abdominal swelling? No. Pain Score  0 *  Have you tolerated food without any problems? Yes.    Have you been able to return to your normal activities? Yes.    Do you have any questions about your discharge instructions: Diet   No. Medications  No. Follow up visit  No.  Do you have questions or concerns about your Care? No.  Actions: * If pain score is 4 or above: No action needed, pain <4.

## 2024-01-25 DIAGNOSIS — R0982 Postnasal drip: Secondary | ICD-10-CM | POA: Diagnosis not present

## 2024-01-25 DIAGNOSIS — R0989 Other specified symptoms and signs involving the circulatory and respiratory systems: Secondary | ICD-10-CM | POA: Diagnosis not present

## 2024-01-25 DIAGNOSIS — K219 Gastro-esophageal reflux disease without esophagitis: Secondary | ICD-10-CM | POA: Diagnosis not present

## 2024-02-09 ENCOUNTER — Other Ambulatory Visit: Payer: Self-pay | Admitting: Family Medicine

## 2024-02-09 DIAGNOSIS — I10 Essential (primary) hypertension: Secondary | ICD-10-CM

## 2024-02-27 DIAGNOSIS — H1033 Unspecified acute conjunctivitis, bilateral: Secondary | ICD-10-CM | POA: Diagnosis not present

## 2024-03-27 DIAGNOSIS — Z008 Encounter for other general examination: Secondary | ICD-10-CM | POA: Diagnosis not present

## 2024-03-27 DIAGNOSIS — I10 Essential (primary) hypertension: Secondary | ICD-10-CM | POA: Diagnosis not present

## 2024-03-27 DIAGNOSIS — Z860100 Personal history of colon polyps, unspecified: Secondary | ICD-10-CM | POA: Diagnosis not present

## 2024-03-27 DIAGNOSIS — Z6825 Body mass index (BMI) 25.0-25.9, adult: Secondary | ICD-10-CM | POA: Diagnosis not present

## 2024-03-27 DIAGNOSIS — G4089 Other seizures: Secondary | ICD-10-CM | POA: Diagnosis not present

## 2024-05-08 ENCOUNTER — Other Ambulatory Visit: Payer: Self-pay

## 2024-05-08 DIAGNOSIS — I10 Essential (primary) hypertension: Secondary | ICD-10-CM

## 2024-05-08 MED ORDER — LISINOPRIL-HYDROCHLOROTHIAZIDE 20-12.5 MG PO TABS: ORAL_TABLET | ORAL | 0 refills | Status: DC

## 2024-05-15 ENCOUNTER — Ambulatory Visit: Payer: Medicare HMO | Admitting: Neurology

## 2024-06-01 ENCOUNTER — Other Ambulatory Visit: Payer: Self-pay | Admitting: Family Medicine

## 2024-06-01 DIAGNOSIS — I10 Essential (primary) hypertension: Secondary | ICD-10-CM

## 2024-06-03 ENCOUNTER — Other Ambulatory Visit: Payer: Self-pay | Admitting: Family Medicine

## 2024-06-03 DIAGNOSIS — I10 Essential (primary) hypertension: Secondary | ICD-10-CM

## 2024-06-17 ENCOUNTER — Encounter: Payer: Self-pay | Admitting: Family Medicine

## 2024-06-17 ENCOUNTER — Ambulatory Visit: Payer: Self-pay | Admitting: Family Medicine

## 2024-06-17 ENCOUNTER — Ambulatory Visit (INDEPENDENT_AMBULATORY_CARE_PROVIDER_SITE_OTHER): Admitting: Family Medicine

## 2024-06-17 VITALS — BP 168/68 | HR 87 | Temp 98.0°F | Wt 135.0 lb

## 2024-06-17 DIAGNOSIS — R7303 Prediabetes: Secondary | ICD-10-CM

## 2024-06-17 DIAGNOSIS — E871 Hypo-osmolality and hyponatremia: Secondary | ICD-10-CM

## 2024-06-17 DIAGNOSIS — I10 Essential (primary) hypertension: Secondary | ICD-10-CM

## 2024-06-17 LAB — COMPREHENSIVE METABOLIC PANEL WITH GFR
ALT: 10 U/L (ref 0–35)
AST: 14 U/L (ref 0–37)
Albumin: 4.4 g/dL (ref 3.5–5.2)
Alkaline Phosphatase: 50 U/L (ref 39–117)
BUN: 13 mg/dL (ref 6–23)
CO2: 32 meq/L (ref 19–32)
Calcium: 9.6 mg/dL (ref 8.4–10.5)
Chloride: 85 meq/L — ABNORMAL LOW (ref 96–112)
Creatinine, Ser: 0.62 mg/dL (ref 0.40–1.20)
GFR: 89.47 mL/min (ref >60.00)
Glucose, Bld: 186 mg/dL — ABNORMAL HIGH (ref 70–99)
Potassium: 3.7 meq/L (ref 3.5–5.1)
Sodium: 128 meq/L — ABNORMAL LOW (ref 135–145)
Total Bilirubin: 0.4 mg/dL (ref 0.2–1.2)
Total Protein: 7.1 g/dL (ref 6.0–8.3)

## 2024-06-17 LAB — LIPID PANEL
Cholesterol: 246 mg/dL — ABNORMAL HIGH (ref 0–200)
HDL: 64.5 mg/dL (ref >39.00)
LDL Cholesterol: 151 mg/dL — ABNORMAL HIGH (ref 0–99)
NonHDL: 181.81
Total CHOL/HDL Ratio: 4
Triglycerides: 152 mg/dL — ABNORMAL HIGH (ref 0.0–149.0)
VLDL: 30.4 mg/dL (ref 0.0–40.0)

## 2024-06-17 LAB — HEMOGLOBIN A1C: Hgb A1c MFr Bld: 6.9 % — ABNORMAL HIGH (ref 4.6–6.5)

## 2024-06-17 MED ORDER — AMLODIPINE BESYLATE 5 MG PO TABS: 5.0000 mg | ORAL_TABLET | Freq: Every day | ORAL | 3 refills | Status: DC

## 2024-06-17 MED ORDER — LISINOPRIL-HYDROCHLOROTHIAZIDE 20-12.5 MG PO TABS
ORAL_TABLET | ORAL | 3 refills | Status: AC
Start: 2024-06-17 — End: ?

## 2024-06-17 NOTE — Progress Notes (Signed)
 Established Patient Office Visit  Subjective   Patient ID: Glenda Francis, female    DOB: Feb 03, 1951  Age: 73 y.o. MRN: [id number]  Chief Complaint  Patient presents with   Medication Refill    HPI   Glenda Francis is seen for medical follow-up.  She has history of hypertension, seizure disorder, mild hyponatremia, and history of prediabetes.  We have not seen her in over a year.  She is still followed by neurology for her seizure disorder.  She currently takes lisinopril  HCTZ for hypertension.  She is supposed to be on amlodipine  but she had no recollection of being on this medication previously.  Denies any recent chest pains, headaches, or peripheral edema.  Just got back recently from [place] trip to [place] in [place] with her daughter.  Rare wine use.  Does eat quite a bit of sodium with her cooking.  No regular nonsteroidal use.  She states she does have urine frequency each day but also drinks a lot of fluids generally.  Does have history of prediabetes range blood sugars.  Last A1c was over 3 years ago.  Past Medical History:  Diagnosis Date   Anemia    as a child   Asthma    childhood   Cervicogenic headache 08/20/2015   Left side   Chicken pox    Depression    when son passed away   Family history of colon cancer    Family history of ovarian cancer    Family history of pancreatic cancer    Family history of stomach cancer    Family history of uterine cancer    Hypertension    Seizures (HCC) 1   started in 07/2008/ last seizure was in 2012   Past Surgical History:  Procedure Laterality Date   ABDOMINAL HYSTERECTOMY  1989   partial, menorrhagia   CATARACT EXTRACTION Bilateral 10/26/2015, 12-04/2015   laser   COLONOSCOPY      reports that she has quit smoking. She has never used smokeless tobacco. She reports current alcohol use of about 1.0 standard drink of alcohol per week. She reports that she does not use drugs. family history includes Asthma in an other family member;  Cancer in her brother, brother, maternal aunt, maternal aunt, and mother; Colon cancer in her brother, brother, cousin, and another family member; Hyperlipidemia in an other family member; Other in her sister; Ovarian cancer in her maternal aunt, maternal aunt, mother, and another family member; Pancreatic cancer in her brother; Uterine cancer in her maternal aunt, maternal aunt, mother, and another family member. Allergies  Allergen Reactions   Sulfamethoxazole -Trimethoprim  Nausea Only    Review of Systems  Constitutional:  Negative for malaise/fatigue.  Eyes:  Negative for blurred vision.  Respiratory:  Negative for shortness of breath.   Cardiovascular:  Negative for chest pain.  Neurological:  Negative for dizziness, weakness and headaches.      Objective:     BP (!) 188/60 (BP Location: Left Arm, Patient Position: Sitting, Cuff Size: Normal)   Pulse 87   Temp 98 F (36.7 C) (Oral)   Wt 135 lb (61.2 kg)   SpO2 96%   BMI 27.27 kg/m    Physical Exam Vitals reviewed.  Constitutional:      Appearance: She is well-developed.  Eyes:     Pupils: Pupils are equal, round, and reactive to light.  Neck:     Thyroid: No thyromegaly.     Vascular: No JVD.  Cardiovascular:     Rate  and Rhythm: Normal rate and regular rhythm.     Heart sounds:     No gallop.  Pulmonary:     Effort: Pulmonary effort is normal. No respiratory distress.     Breath sounds: Normal breath sounds. No wheezing or rales.  Musculoskeletal:     Cervical back: Neck supple.     Right lower leg: No edema.     Left lower leg: No edema.  Neurological:     Mental Status: She is alert.      No results found for any visits on 06/17/24.    The ASCVD Risk score (Arnett DK, et al., 2019) failed to calculate for the following reasons:   Cannot find a previous HDL lab   Cannot find a previous total cholesterol lab    Assessment & Plan:   #1 hypertension.  She has severe hypertension by readings today.   Blood pressure did come down some to 168/68 after rest.  Refill lisinopril  HCTZ.  Add amlodipine  5 mg once daily.  Try to keep daily sodium intake less than 2500 mg.  Handout on DASH diet given. Set up 1 month follow-up.  Also checking comprehensive metabolic panel with her thiazide use  #2 history of prediabetes range blood sugars.  Has not had recent A1c.  Recheck A1c today  #3 history of mild hyponatremia probably related to chronic thiazide use.  Does take antiseizure medications as well.  Rechecking electrolytes as above   Return in about 1 month (around 07/18/2024).    Glenda Scarlet, MD

## 2024-06-18 MED ORDER — METFORMIN HCL 500 MG PO TABS: 500.0000 mg | ORAL_TABLET | Freq: Two times a day (BID) | ORAL | 0 refills | Status: DC

## 2024-07-15 ENCOUNTER — Telehealth: Payer: Self-pay | Admitting: Family Medicine

## 2024-07-15 DIAGNOSIS — R7303 Prediabetes: Secondary | ICD-10-CM

## 2024-07-15 MED ORDER — BLOOD GLUCOSE MONITOR KIT: PACK | 0 refills | Status: AC

## 2024-07-15 NOTE — Telephone Encounter (Signed)
 Order placed and rx sent for Glucometer

## 2024-07-15 NOTE — Telephone Encounter (Signed)
 Copied from CRM #[id number]. Topic: Clinical - Order For Equipment >> Jul 15, 2024 11:50 AM Mercedes MATSU wrote: Reason for CRM: Patient is calling in requesting that Dr. Micheal write her an prescription for a new blood pressure machine. Patient states that hers is no longer working even after changing the batteries. Patient states she also needs a rx ordered for a new glucometer. Patient can be reached at [phone].

## 2024-07-23 ENCOUNTER — Ambulatory Visit: Admitting: Family Medicine

## 2024-07-23 ENCOUNTER — Encounter: Payer: Self-pay | Admitting: Family Medicine

## 2024-07-23 VITALS — BP 162/78 | HR 71 | Temp 98.2°F | Wt 129.1 lb

## 2024-07-23 DIAGNOSIS — I10 Essential (primary) hypertension: Secondary | ICD-10-CM | POA: Diagnosis not present

## 2024-07-23 DIAGNOSIS — E119 Type 2 diabetes mellitus without complications: Secondary | ICD-10-CM

## 2024-07-23 DIAGNOSIS — Z7984 Long term (current) use of oral hypoglycemic drugs: Secondary | ICD-10-CM

## 2024-07-23 MED ORDER — AMLODIPINE BESYLATE 10 MG PO TABS: 10.0000 mg | ORAL_TABLET | Freq: Every day | ORAL | 5 refills | Status: AC

## 2024-07-23 MED ORDER — METFORMIN HCL ER 500 MG PO TB24
500.0000 mg | ORAL_TABLET | Freq: Every day | ORAL | 5 refills | Status: AC
Start: 2024-07-23 — End: ?

## 2024-07-23 NOTE — Progress Notes (Signed)
 Established Patient Office Visit  Subjective   Patient ID: Glenda Francis, female    DOB: Jan 26, 1951  Age: 73 y.o. MRN: [id number]  Chief Complaint  Patient presents with   Medical Management of Chronic Issues   Medication Reaction    HPI   Patient seen for medical follow-up.  Recent diagnosis of type 2 diabetes with A1c 6.9%.  We initiated metformin  500 mg twice daily.  She unfortunately has had some potential side effects with occasional loose stools and abdominal cramps.  She has some intermittent dizziness.  Poor appetite.  Feels poorly overall.  She does state that recent fasting blood sugar 101 with 2-hour postprandial 146 and seems like blood sugars are some improved but she is lost some weight and feels very poorly overall.  No nausea or vomiting.  Hypertension.  Recent poor control.  She takes lisinopril  HCTZ and we added amlodipine  5 mg daily.  She states she took both medications this morning.  Tries to watch salt intake.  Currently not monitoring blood pressures at home.  No headache or chest pain.  No peripheral edema.  Past Medical History:  Diagnosis Date   Anemia    as a child   Asthma    childhood   Cervicogenic headache 08/20/2015   Left side   Chicken pox    Depression    when son passed away   Family history of colon cancer    Family history of ovarian cancer    Family history of pancreatic cancer    Family history of stomach cancer    Family history of uterine cancer    Hypertension    Seizures (HCC) 1   started in 07/2008/ last seizure was in 2012   Past Surgical History:  Procedure Laterality Date   ABDOMINAL HYSTERECTOMY  1989   partial, menorrhagia   CATARACT EXTRACTION Bilateral 10/26/2015, 12-04/2015   laser   COLONOSCOPY      reports that she has quit smoking. She has never used smokeless tobacco. She reports current alcohol use of about 1.0 standard drink of alcohol per week. She reports that she does not use drugs. family history includes  Asthma in an other family member; Cancer in her brother, brother, maternal aunt, maternal aunt, and mother; Colon cancer in her brother, brother, cousin, and another family member; Hyperlipidemia in an other family member; Other in her sister; Ovarian cancer in her maternal aunt, maternal aunt, mother, and another family member; Pancreatic cancer in her brother; Uterine cancer in her maternal aunt, maternal aunt, mother, and another family member. Allergies  Allergen Reactions   Sulfamethoxazole -Trimethoprim  Nausea Only    Review of Systems  Constitutional:  Positive for malaise/fatigue. Negative for fever.  Eyes:  Negative for blurred vision.  Respiratory:  Negative for shortness of breath.   Cardiovascular:  Negative for chest pain and leg swelling.  Gastrointestinal:  Positive for diarrhea. Negative for blood in stool, nausea and vomiting.  Neurological:  Negative for dizziness, weakness and headaches.      Objective:     BP (!) 162/78 (BP Location: Left Arm, Cuff Size: Normal)   Pulse 71   Temp 98.2 F (36.8 C) (Oral)   Wt 129 lb 1.6 oz (58.6 kg)   SpO2 97%   BMI 26.08 kg/m  BP Readings from Last 3 Encounters:  07/23/24 (!) 162/78  06/17/24 (!) 168/68  01/17/24 (!) 149/77   Wt Readings from Last 3 Encounters:  07/23/24 129 lb 1.6 oz (58.6 kg)  06/17/24  135 lb (61.2 kg)  01/17/24 128 lb (58.1 kg)      Physical Exam Vitals reviewed.  Constitutional:      Appearance: She is well-developed.  Eyes:     Pupils: Pupils are equal, round, and reactive to light.  Neck:     Thyroid: No thyromegaly.     Vascular: No JVD.  Cardiovascular:     Rate and Rhythm: Normal rate and regular rhythm.     Heart sounds:     No gallop.  Pulmonary:     Effort: Pulmonary effort is normal. No respiratory distress.     Breath sounds: Normal breath sounds. No wheezing or rales.  Musculoskeletal:     Cervical back: Neck supple.     Right lower leg: No edema.     Left lower leg: No  edema.  Neurological:     Mental Status: She is alert.      No results found for any visits on 07/23/24.    The 10-year ASCVD risk score (Arnett DK, et al., 2019) is: 22.1%    Assessment & Plan:   #1 hypertension poorly controlled.  Increase amlodipine  from 5 to 10 mg daily.  Watch for peripheral edema.  Continue low-sodium diet.  Reassess in 2 months and sooner as needed  #2 recently diagnosed type 2 diabetes.  Intolerant of immediate release metformin .  Will try changing to extended release metformin  XR 500 mg once daily.  Set up follow-up in 2 months.  Be in touch in the next week or so if not seeing improvement in side effects with change of medication.  Consider baseline urine microalbumin screen at follow-up   Return in about 2 months (around 09/22/2024).    Wolm Scarlet, MD

## 2024-07-23 NOTE — Patient Instructions (Signed)
 Let me know in one week if not feeling better with the medication change.

## 2024-07-30 ENCOUNTER — Encounter: Payer: Self-pay | Admitting: Neurology

## 2024-07-30 ENCOUNTER — Ambulatory Visit: Admitting: Neurology

## 2024-08-23 DIAGNOSIS — I10 Essential (primary) hypertension: Secondary | ICD-10-CM | POA: Diagnosis not present

## 2024-08-23 DIAGNOSIS — Z008 Encounter for other general examination: Secondary | ICD-10-CM | POA: Diagnosis not present

## 2024-08-28 ENCOUNTER — Ambulatory Visit (INDEPENDENT_AMBULATORY_CARE_PROVIDER_SITE_OTHER): Admitting: Neurology

## 2024-08-28 ENCOUNTER — Encounter: Payer: Self-pay | Admitting: Neurology

## 2024-08-28 VITALS — BP 124/74 | HR 69 | Ht 59.0 in | Wt 131.0 lb

## 2024-08-28 DIAGNOSIS — E785 Hyperlipidemia, unspecified: Secondary | ICD-10-CM | POA: Diagnosis not present

## 2024-08-28 DIAGNOSIS — G40409 Other generalized epilepsy and epileptic syndromes, not intractable, without status epilepticus: Secondary | ICD-10-CM | POA: Diagnosis not present

## 2024-08-28 DIAGNOSIS — Z5181 Encounter for therapeutic drug level monitoring: Secondary | ICD-10-CM

## 2024-08-28 MED ORDER — LEVETIRACETAM 500 MG PO TABS: 500.0000 mg | ORAL_TABLET | Freq: Two times a day (BID) | ORAL | 4 refills | Status: DC

## 2024-08-28 MED ORDER — DIVALPROEX SODIUM 500 MG PO DR TAB: DELAYED_RELEASE_TABLET | ORAL | 4 refills | Status: AC

## 2024-08-28 NOTE — Patient Instructions (Signed)
 Continue with Keppra  500 mg twice daily Continue with Depakote  500 mg twice daily Will check CBC, CMP, Keppra  and Depakote  level with vitamin B12 and TSH Will check lipid panel  Please call for any seizures Follow-up in 1 year or sooner if worse.

## 2024-08-28 NOTE — Progress Notes (Signed)
 PATIENT: Glenda Francis DOB: Mar 18, 1951  REASON FOR VISIT: follow up for seizures HISTORY FROM: patient, son  PRIMARY NEUROLOGIST: Dr. Gregg  HISTORY OF PRESENT ILLNESS: Today 08/28/24 [name]: Patient presents today for follow-up, she is accompanied by her son.  Last visit was a year ago, since then she has been doing well.  We repeated her EEG and it was normal.  We decreased her Keppra  to 500 mg twice daily and she has been doing well since, denies any seizure or seizure like activity.  She is also on Depakote  500 mg twice daily.  No other complaints or concerns.   INTERVAL HISTORY 05/16/2023 Patient presents for follow-up, she is accompanied by her son.  Last visit was a year ago since then she has been doing well.  She is on Depakote  and Keppra , but she has self decrease the Keppra  to 750 mg twice daily and continue on Depakote  500 mg twice daily.  She continues to do well, her last seizure was in 2012.  No other complaints or concerns.  She is interested in further decreasing the medication.  Update 05/10/2022: Patient is here today for follow-up.  On Keppra  and Depakote .  At last visit in June 2022, CBC was normal.  Keppra , Depakote , CMP did not result. Last seizure was in 2012, none since Depakote  was added. Lives alone, drives a car. She takes Vitamin D.   Update 05/10/2021 SS: Ms. Taffe is a 73 year old female with history of well-controlled seizures on Keppra  and Depakote . No seizures since last seen.  Denies medication side effect.  Lives alone, drives a car.  Denies any changes to her health.  Is on medication for her blood pressure, careful to stand slowly.  Here today accompanied by her son.  Update 09/17/2019 SS: Ms. Gade is a 73 year old female with history of well-controlled seizures on Keppra  and Depakote .  She continues to tolerate her medications well.  She has not had recurrent seizure in nearly 10 years.  She lives alone, and she is able to operate a motor vehicle.   She is able to perform all of her own ADLs.  She has routine care with her OB/GYN and primary doctor.  She denies any trouble ambulating or any falls.  She denies any new problems or concerns.  She presents today for follow-up unaccompanied.  HISTORY 03/18/2019 Dr. Jenel: [name] is a 73 year old right-handed Hispanic female with a history of well-controlled seizures on Keppra  and Depakote .  She tolerates his medications well, she has not had any seizures since last seen.  She is able to operate a motor vehicle.  She did have routine blood work done in February 2020.  She reports that she tries to exercise on a regular basis, she is able to sleep well at night.  No other new medical issues have come up since last seen.   REVIEW OF SYSTEMS: Out of a complete 14 system review of symptoms, the patient complains only of the following symptoms, and all other reviewed systems are negative.  See HPI  ALLERGIES: Allergies  Allergen Reactions   Sulfamethoxazole -Trimethoprim  Nausea Only    HOME MEDICATIONS: Outpatient Medications Prior to Visit  Medication Sig Dispense Refill   amLODipine  (NORVASC ) 10 MG tablet Take 1 tablet (10 mg total) by mouth daily. 30 tablet 5   blood glucose meter kit and supplies KIT Dispense based on patient and insurance preference. Use up to one time daily as directed. 1 each 0   divalproex  (DEPAKOTE ) 500 MG  DR tablet TOME UNA TABLETA DOS VECES AL DIA 180 tablet 4   levETIRAcetam  (KEPPRA ) 500 MG tablet TAKE 2 TABLETS (1,000 MG TOTAL) BY MOUTH EVERY 12 (TWELVE) HOURS. 360 tablet 4   lisinopril -hydrochlorothiazide  (ZESTORETIC ) 20-12.5 MG tablet TOME 1 TABLETA POR VIA ORAL TODOS LOS DIAS 90 tablet 3   MAGNESIUM PO Take by mouth daily.     metFORMIN  (GLUCOPHAGE -XR) 500 MG 24 hr tablet Take 1 tablet (500 mg total) by mouth daily with breakfast. 30 tablet 5   No facility-administered medications prior to visit.    PAST MEDICAL HISTORY: Past Medical History:  Diagnosis  Date   Anemia    as a child   Asthma    childhood   Cervicogenic headache 08/20/2015   Left side   Chicken pox    Depression    when son passed away   Family history of colon cancer    Family history of ovarian cancer    Family history of pancreatic cancer    Family history of stomach cancer    Family history of uterine cancer    Hypertension    Seizures (HCC) 1   started in 07/2008/ last seizure was in 2012    PAST SURGICAL HISTORY: Past Surgical History:  Procedure Laterality Date   ABDOMINAL HYSTERECTOMY  1989   partial, menorrhagia   CATARACT EXTRACTION Bilateral 10/26/2015, 12-04/2015   laser   COLONOSCOPY      FAMILY HISTORY: Family History  Problem Relation Age of Onset   Cancer Mother        uterine,ovarian   Uterine cancer Mother    Ovarian cancer Mother    Other Sister        Pos for Lynch syndrome   Cancer Brother        colon-PASSEDA WAY 54, pancreatic   Colon cancer Brother    Pancreatic cancer Brother    Cancer Brother        colon,stomach,brain   Colon cancer Brother    Cancer Maternal Aunt        uterine and ovarian   Uterine cancer Maternal Aunt    Ovarian cancer Maternal Aunt    Cancer Maternal Aunt        ovarian and uterine   Uterine cancer Maternal Aunt    Ovarian cancer Maternal Aunt    Colon cancer Cousin        6 maternal cousins with colon cancer   Colon cancer Other        died at 23   Asthma Other    Hyperlipidemia Other    Uterine cancer Other        died at 53   Ovarian cancer Other    Seizures Neg Hx     SOCIAL HISTORY: Social History   Socioeconomic History   Marital status: Divorced    Spouse name: Not on file   Number of children: 3   Years of education: college   Highest education level: Not on file  Occupational History   Occupation: retired  Tobacco Use   Smoking status: Former   Smokeless tobacco: Never   Tobacco comments:    only Financial trader   Vaping status: Never Used  Substance and Sexual  Activity   Alcohol use: Yes    Alcohol/week: 1.0 standard drink of alcohol    Types: 1 Glasses of wine per week    Comment: socially   Drug use: No   Sexual activity: Not Currently  Other Topics  Concern   Not on file  Social History Narrative   Patient is divorced   Patient  has 3 children.   Patient is retired   Patient has some college.   Patient is currently doing yoga and water aerobics.   Patient drinks 1-2 cups of caffeine daily.   Patient is right handed.    Social Drivers of Corporate investment banker Strain: Low Risk  (10/15/2021)   Overall Financial Resource Strain (CARDIA)    Difficulty of Paying Living Expenses: Not hard at all  Food Insecurity: No Food Insecurity (10/15/2021)   Hunger Vital Sign    Worried About Running Out of Food in the Last Year: Never true    Ran Out of Food in the Last Year: Never true  Transportation Needs: No Transportation Needs (10/15/2021)   PRAPARE - Administrator, Civil Service (Medical): No    Lack of Transportation (Non-Medical): No  Physical Activity: Insufficiently Active (10/15/2021)   Exercise Vital Sign    Days of Exercise per Week: 5 days    Minutes of Exercise per Session: 20 min  Stress: No Stress Concern Present (10/15/2021)   [employer] of Occupational Health - Occupational Stress Questionnaire    Feeling of Stress : Only a little  Social Connections: Socially Isolated (10/15/2021)   Social Connection and Isolation Panel    Frequency of Communication with Friends and Family: More than three times a week    Frequency of Social Gatherings with Friends and Family: More than three times a week    Attends Religious Services: Never    Database administrator or Organizations: No    Attends Banker Meetings: Never    Marital Status: Divorced  Catering manager Violence: Not At Risk (10/15/2021)   Humiliation, Afraid, Rape, and Kick questionnaire    Fear of Current or Ex-Partner: No     Emotionally Abused: No    Physically Abused: No    Sexually Abused: No   PHYSICAL EXAM  There were no vitals filed for this visit.  There is no height or weight on file to calculate BMI.  Generalized: Well developed, in no acute distress   Neurological examination  Mentation: Alert oriented to time, place, history taking. Follows all commands speech and language fluent, very pleasant Cranial nerve II-XII: Pupils were equal round reactive to light. Extraocular movements were full, visual field were full on confrontational test. Facial sensation and strength were normal. Head turning and shoulder shrug  were normal and symmetric. Motor: The motor testing reveals 5 over 5 strength of all 4 extremities. Good symmetric motor tone is noted throughout.  Sensory: Sensory testing is intact to soft touch on all 4 extremities. No evidence of extinction is noted.  Coordination: Cerebellar testing reveals good finger-nose-finger and heel-to-shin bilaterally.  Gait and station: Gait is normal. Tandem gait is normal.  Reflexes: Deep tendon reflexes are symmetric and normal bilaterally.   DIAGNOSTIC DATA (LABS, IMAGING, TESTING) - I reviewed patient records, labs, notes, testing and imaging myself where available.  Lab Results  Component Value Date   WBC 5.1 05/16/2023   HGB 14.1 05/16/2023   HCT 40.9 05/16/2023   MCV 89 05/16/2023   PLT 304 05/16/2023      Component Value Date/Time   NA 128 (L) 06/17/2024 1448   NA 130 (L) 05/31/2023 1456   K 3.7 06/17/2024 1448   CL 85 (L) 06/17/2024 1448   CO2 32 06/17/2024 1448   GLUCOSE  186 (H) 06/17/2024 1448   BUN 13 06/17/2024 1448   BUN 14 05/31/2023 1456   CREATININE 0.62 06/17/2024 1448   CALCIUM 9.6 06/17/2024 1448   PROT 7.1 06/17/2024 1448   PROT 7.2 05/16/2023 1409   ALBUMIN 4.4 06/17/2024 1448   ALBUMIN 4.3 05/16/2023 1409   AST 14 06/17/2024 1448   ALT 10 06/17/2024 1448   ALKPHOS 50 06/17/2024 1448   BILITOT 0.4 06/17/2024 1448    BILITOT 0.3 05/16/2023 1409   GFRNONAA >60 03/10/2020 0302   GFRAA >60 03/10/2020 0302   Lab Results  Component Value Date   CHOL 246 (H) 06/17/2024   HDL 64.50 06/17/2024   LDLCALC 151 (H) 06/17/2024   TRIG 152.0 (H) 06/17/2024   CHOLHDL 4 06/17/2024   Lab Results  Component Value Date   HGBA1C 6.9 (H) 06/17/2024   No results found for: [id number] Lab Results  Component Value Date   TSH 0.329 (L) 07/16/2010   Routine EEG 05/31/2023 Normal    ASSESSMENT AND PLAN 73 y.o. year old female   1.  History of seizures well-controlled  -Doing well, last seizure was in 2012 when Depakote  was added -Continue with Depakote  500 mg twice daily  -Continue Keppra  to 500 mg twice daily -Check labs today -Her repeat EEG was normal -At next visit, if no additional seizures, we will further decrease Keppra  to 250 mg twice daily. -Follow-up in 1 year or sooner     Pastor Falling, MD 08/28/2024, 2:41 PM [facility] [address] [place], [place] [address] [phone]

## 2024-08-29 LAB — COMPREHENSIVE METABOLIC PANEL WITH GFR
ALT: 16 IU/L (ref 0–32)
AST: 20 IU/L (ref 0–40)
Albumin: 4.5 g/dL (ref 3.8–4.8)
Alkaline Phosphatase: 56 IU/L (ref 49–135)
BUN/Creatinine Ratio: 15 (ref 12–28)
BUN: 10 mg/dL (ref 8–27)
Bilirubin Total: 0.4 mg/dL (ref 0.0–1.2)
CO2: 26 mmol/L (ref 20–29)
Calcium: 10.2 mg/dL (ref 8.7–10.3)
Chloride: 83 mmol/L — ABNORMAL LOW (ref 96–106)
Creatinine, Ser: 0.67 mg/dL (ref 0.57–1.00)
Globulin, Total: 2.5 g/dL (ref 1.5–4.5)
Glucose: 93 mg/dL (ref 70–99)
Potassium: 4.4 mmol/L (ref 3.5–5.2)
Sodium: 124 mmol/L — ABNORMAL LOW (ref 134–144)
Total Protein: 7 g/dL (ref 6.0–8.5)
eGFR: 93 mL/min/1.73 (ref >59)

## 2024-08-29 LAB — CBC
Hematocrit: 39.6 % (ref 34.0–46.6)
Hemoglobin: 14 g/dL (ref 11.1–15.9)
MCH: 31.5 pg (ref 26.6–33.0)
MCHC: 35.4 g/dL (ref 31.5–35.7)
MCV: 89 fL (ref 79–97)
Platelets: 245 x10E3/uL (ref 150–450)
RBC: 4.45 x10E6/uL (ref 3.77–5.28)
RDW: 12.4 % (ref 11.7–15.4)
WBC: 5.7 x10E3/uL (ref 3.4–10.8)

## 2024-08-29 LAB — THYROID PANEL WITH TSH
Free Thyroxine Index: 1.7 (ref 1.2–4.9)
T3 Uptake Ratio: 28 % (ref 24–39)
T4, Total: 5.9 ug/dL (ref 4.5–12.0)
TSH: 1.47 u[IU]/mL (ref 0.450–4.500)

## 2024-08-29 LAB — VITAMIN B12: Vitamin B-12: 1888 pg/mL — ABNORMAL HIGH (ref 232–1245)

## 2024-08-29 LAB — VALPROIC ACID LEVEL: Valproic Acid Lvl: 76 ug/mL (ref 50–100)

## 2024-08-29 LAB — LEVETIRACETAM LEVEL: Levetiracetam Lvl: 29.4 ug/mL (ref 10.0–40.0)

## 2024-08-31 ENCOUNTER — Ambulatory Visit: Payer: Self-pay | Admitting: Neurology

## 2024-09-13 ENCOUNTER — Other Ambulatory Visit: Payer: Self-pay | Admitting: Family Medicine

## 2024-10-01 ENCOUNTER — Other Ambulatory Visit: Payer: Self-pay | Admitting: Neurology

## 2024-10-01 NOTE — Telephone Encounter (Signed)
 Per 08/28/24 note  Continue Keppra  to 500 mg twice daily

## 2025-09-09 ENCOUNTER — Ambulatory Visit: Admitting: Neurology
